# Patient Record
Sex: Female | Born: 1955 | Race: White | Hispanic: No | Marital: Married | Smoking: Never smoker
Health system: Southern US, Community
[De-identification: ages and names within clinical notes are randomized; demographics above are authoritative.]

## PROBLEM LIST (undated history)

## (undated) DIAGNOSIS — N393 Stress incontinence (female) (male): Secondary | ICD-10-CM

## (undated) DIAGNOSIS — R42 Dizziness and giddiness: Secondary | ICD-10-CM

## (undated) DIAGNOSIS — R112 Nausea with vomiting, unspecified: Secondary | ICD-10-CM

## (undated) DIAGNOSIS — G4730 Sleep apnea, unspecified: Secondary | ICD-10-CM

## (undated) DIAGNOSIS — E0590 Thyrotoxicosis, unspecified without thyrotoxic crisis or storm: Secondary | ICD-10-CM

## (undated) DIAGNOSIS — B001 Herpesviral vesicular dermatitis: Secondary | ICD-10-CM

## (undated) DIAGNOSIS — T7840XA Allergy, unspecified, initial encounter: Secondary | ICD-10-CM

## (undated) DIAGNOSIS — Z9889 Other specified postprocedural states: Secondary | ICD-10-CM

## (undated) DIAGNOSIS — E7800 Pure hypercholesterolemia, unspecified: Secondary | ICD-10-CM

## (undated) DIAGNOSIS — M4316 Spondylolisthesis, lumbar region: Secondary | ICD-10-CM

## (undated) DIAGNOSIS — G4733 Obstructive sleep apnea (adult) (pediatric): Secondary | ICD-10-CM

## (undated) DIAGNOSIS — I639 Cerebral infarction, unspecified: Secondary | ICD-10-CM

## (undated) DIAGNOSIS — K222 Esophageal obstruction: Secondary | ICD-10-CM

## (undated) DIAGNOSIS — F32A Depression, unspecified: Secondary | ICD-10-CM

## (undated) DIAGNOSIS — D649 Anemia, unspecified: Secondary | ICD-10-CM

## (undated) DIAGNOSIS — M1712 Unilateral primary osteoarthritis, left knee: Secondary | ICD-10-CM

## (undated) DIAGNOSIS — S82899A Other fracture of unspecified lower leg, initial encounter for closed fracture: Secondary | ICD-10-CM

## (undated) DIAGNOSIS — I10 Essential (primary) hypertension: Secondary | ICD-10-CM

## (undated) DIAGNOSIS — J189 Pneumonia, unspecified organism: Secondary | ICD-10-CM

## (undated) DIAGNOSIS — E119 Type 2 diabetes mellitus without complications: Secondary | ICD-10-CM

## (undated) DIAGNOSIS — G2581 Restless legs syndrome: Secondary | ICD-10-CM

## (undated) DIAGNOSIS — M797 Fibromyalgia: Secondary | ICD-10-CM

## (undated) DIAGNOSIS — R51 Headache: Secondary | ICD-10-CM

## (undated) DIAGNOSIS — M722 Plantar fascial fibromatosis: Secondary | ICD-10-CM

## (undated) DIAGNOSIS — K219 Gastro-esophageal reflux disease without esophagitis: Secondary | ICD-10-CM

## (undated) DIAGNOSIS — M1990 Unspecified osteoarthritis, unspecified site: Secondary | ICD-10-CM

## (undated) DIAGNOSIS — K760 Fatty (change of) liver, not elsewhere classified: Secondary | ICD-10-CM

## (undated) DIAGNOSIS — H269 Unspecified cataract: Secondary | ICD-10-CM

## (undated) HISTORY — DX: Fatty (change of) liver, not elsewhere classified: K76.0

## (undated) HISTORY — PX: TRIGGER FINGER RELEASE: SHX641

## (undated) HISTORY — DX: Other specified postprocedural states: R11.2

## (undated) HISTORY — PX: OTHER SURGICAL HISTORY: SHX169

## (undated) HISTORY — DX: Unspecified osteoarthritis, unspecified site: M19.90

## (undated) HISTORY — DX: Essential (primary) hypertension: I10

## (undated) HISTORY — DX: Pure hypercholesterolemia, unspecified: E78.00

## (undated) HISTORY — DX: Allergy, unspecified, initial encounter: T78.40XA

## (undated) HISTORY — DX: Sleep apnea, unspecified: G47.30

## (undated) HISTORY — DX: Gastro-esophageal reflux disease without esophagitis: K21.9

## (undated) HISTORY — PX: WISDOM TOOTH EXTRACTION: SHX21

## (undated) HISTORY — PX: SHOULDER SURGERY: SHX246

## (undated) HISTORY — DX: Restless legs syndrome: G25.81

## (undated) HISTORY — PX: COLONOSCOPY W/ BIOPSIES AND POLYPECTOMY: SHX1376

## (undated) HISTORY — DX: Esophageal obstruction: K22.2

## (undated) HISTORY — PX: EYE SURGERY: SHX253

## (undated) HISTORY — DX: Other specified postprocedural states: Z98.890

## (undated) HISTORY — DX: Type 2 diabetes mellitus without complications: E11.9

## (undated) HISTORY — DX: Plantar fascial fibromatosis: M72.2

## (undated) HISTORY — DX: Headache: R51

## (undated) HISTORY — PX: CERVICAL DISC SURGERY: SHX588

## (undated) HISTORY — DX: Obstructive sleep apnea (adult) (pediatric): G47.33

## (undated) HISTORY — DX: Unspecified cataract: H26.9

## (undated) HISTORY — DX: Fibromyalgia: M79.7

## (undated) HISTORY — DX: Other fracture of unspecified lower leg, initial encounter for closed fracture: S82.899A

---

## 1994-06-03 ENCOUNTER — Encounter: Payer: Self-pay | Admitting: Internal Medicine

## 1999-04-28 ENCOUNTER — Encounter (INDEPENDENT_AMBULATORY_CARE_PROVIDER_SITE_OTHER): Payer: Self-pay | Admitting: Specialist

## 1999-04-28 ENCOUNTER — Encounter: Payer: Self-pay | Admitting: Internal Medicine

## 1999-04-28 ENCOUNTER — Other Ambulatory Visit: Admission: RE | Admit: 1999-04-28 | Discharge: 1999-04-28 | Payer: Self-pay | Admitting: Internal Medicine

## 1999-06-24 ENCOUNTER — Other Ambulatory Visit: Admission: RE | Admit: 1999-06-24 | Discharge: 1999-06-24 | Payer: Self-pay | Admitting: Gynecology

## 2000-09-06 ENCOUNTER — Other Ambulatory Visit: Admission: RE | Admit: 2000-09-06 | Discharge: 2000-09-06 | Payer: Self-pay | Admitting: Gynecology

## 2001-11-28 ENCOUNTER — Other Ambulatory Visit: Admission: RE | Admit: 2001-11-28 | Discharge: 2001-11-28 | Payer: Self-pay | Admitting: Gynecology

## 2002-12-18 ENCOUNTER — Other Ambulatory Visit: Admission: RE | Admit: 2002-12-18 | Discharge: 2002-12-18 | Payer: Self-pay | Admitting: Gynecology

## 2003-02-24 DIAGNOSIS — M797 Fibromyalgia: Secondary | ICD-10-CM

## 2003-02-24 HISTORY — DX: Fibromyalgia: M79.7

## 2003-12-25 ENCOUNTER — Other Ambulatory Visit: Admission: RE | Admit: 2003-12-25 | Discharge: 2003-12-25 | Payer: Self-pay | Admitting: Gynecology

## 2005-02-03 ENCOUNTER — Other Ambulatory Visit: Admission: RE | Admit: 2005-02-03 | Discharge: 2005-02-03 | Payer: Self-pay | Admitting: Gynecology

## 2007-08-16 ENCOUNTER — Ambulatory Visit: Payer: Self-pay

## 2007-11-10 ENCOUNTER — Encounter: Payer: Self-pay | Admitting: Neurosurgery

## 2007-11-18 ENCOUNTER — Ambulatory Visit (HOSPITAL_COMMUNITY): Admission: RE | Admit: 2007-11-18 | Discharge: 2007-11-18 | Payer: Self-pay | Admitting: Neurosurgery

## 2007-11-21 ENCOUNTER — Ambulatory Visit: Payer: Self-pay | Admitting: Family Medicine

## 2007-12-15 ENCOUNTER — Ambulatory Visit: Payer: Self-pay | Admitting: Internal Medicine

## 2007-12-15 DIAGNOSIS — K219 Gastro-esophageal reflux disease without esophagitis: Secondary | ICD-10-CM | POA: Insufficient documentation

## 2007-12-15 DIAGNOSIS — R740 Nonspecific elevation of levels of transaminase and lactic acid dehydrogenase [LDH]: Secondary | ICD-10-CM

## 2007-12-15 DIAGNOSIS — R7402 Elevation of levels of lactic acid dehydrogenase (LDH): Secondary | ICD-10-CM | POA: Insufficient documentation

## 2007-12-22 ENCOUNTER — Ambulatory Visit: Payer: Self-pay | Admitting: Internal Medicine

## 2007-12-22 DIAGNOSIS — K7689 Other specified diseases of liver: Secondary | ICD-10-CM | POA: Insufficient documentation

## 2007-12-22 LAB — CONVERTED CEMR LAB
A-1 Antitrypsin, Ser: 152 mg/dL (ref 83–200)
AST: 127 units/L — ABNORMAL HIGH (ref 0–37)
Alkaline Phosphatase: 85 units/L (ref 39–117)
Bilirubin, Direct: 0.1 mg/dL (ref 0.0–0.3)
CO2: 29 meq/L (ref 19–32)
Ceruloplasmin: 42 mg/dL (ref 21–63)
Chloride: 101 meq/L (ref 96–112)
GFR calc Af Amer: 114 mL/min
Glucose, Bld: 89 mg/dL (ref 70–99)
INR: 1 (ref 0.8–1.0)
Lymphocytes Relative: 25.6 % (ref 12.0–46.0)
Monocytes Relative: 12.5 % — ABNORMAL HIGH (ref 3.0–12.0)
Neutrophils Relative %: 59.2 % (ref 43.0–77.0)
Platelets: 262 10*3/uL (ref 150–400)
Potassium: 3.4 meq/L — ABNORMAL LOW (ref 3.5–5.1)
Prothrombin Time: 12.1 s (ref 10.9–13.3)
RDW: 12.5 % (ref 11.5–14.6)
Saturation Ratios: 23.3 % (ref 20.0–50.0)
Sodium: 138 meq/L (ref 135–145)
Total Protein: 7.4 g/dL (ref 6.0–8.3)
WBC: 5.6 10*3/uL (ref 4.5–10.5)

## 2008-01-18 ENCOUNTER — Encounter: Payer: Self-pay | Admitting: Orthopedic Surgery

## 2008-01-23 ENCOUNTER — Encounter: Payer: Self-pay | Admitting: Internal Medicine

## 2008-01-23 ENCOUNTER — Ambulatory Visit: Payer: Self-pay | Admitting: Internal Medicine

## 2008-01-24 ENCOUNTER — Encounter: Payer: Self-pay | Admitting: Internal Medicine

## 2008-03-16 ENCOUNTER — Telehealth: Payer: Self-pay | Admitting: Internal Medicine

## 2008-04-04 ENCOUNTER — Ambulatory Visit (HOSPITAL_COMMUNITY): Admission: RE | Admit: 2008-04-04 | Discharge: 2008-04-04 | Payer: Self-pay | Admitting: Neurosurgery

## 2008-04-10 ENCOUNTER — Inpatient Hospital Stay (HOSPITAL_COMMUNITY): Admission: RE | Admit: 2008-04-10 | Discharge: 2008-04-12 | Payer: Self-pay | Admitting: Neurosurgery

## 2008-08-02 ENCOUNTER — Ambulatory Visit: Payer: Self-pay | Admitting: Psychiatry

## 2010-06-10 LAB — BASIC METABOLIC PANEL
CO2: 27 mEq/L (ref 19–32)
Calcium: 9.6 mg/dL (ref 8.4–10.5)
Chloride: 103 mEq/L (ref 96–112)
GFR calc Af Amer: >60 mL/min (ref >60)
Sodium: 139 mEq/L (ref 135–145)

## 2010-06-10 LAB — DIFFERENTIAL
Eosinophils Absolute: 0.2 10*3/uL (ref 0.0–0.7)
Eosinophils Relative: 2 % (ref 0–5)
Lymphs Abs: 2.3 10*3/uL (ref 0.7–4.0)
Monocytes Relative: 10 % (ref 3–12)

## 2010-06-10 LAB — COMPREHENSIVE METABOLIC PANEL
ALT: 192 U/L — ABNORMAL HIGH (ref 0–35)
AST: 127 U/L — ABNORMAL HIGH (ref 0–37)
CO2: 30 mEq/L (ref 19–32)
Calcium: 9.7 mg/dL (ref 8.4–10.5)
GFR calc Af Amer: >60 mL/min (ref >60)
Sodium: 138 mEq/L (ref 135–145)
Total Protein: 7.6 g/dL (ref 6.0–8.3)

## 2010-06-10 LAB — URINALYSIS, ROUTINE W REFLEX MICROSCOPIC
Glucose, UA: NEGATIVE mg/dL
Hgb urine dipstick: NEGATIVE
Specific Gravity, Urine: 1.016 (ref 1.005–1.030)

## 2010-06-10 LAB — CBC
Hemoglobin: 15.4 g/dL — ABNORMAL HIGH (ref 12.0–15.0)
MCHC: 35.7 g/dL (ref 30.0–36.0)
MCHC: 35.2 g/dL (ref 30.0–36.0)
MCV: 87.3 fL (ref 78.0–100.0)
RBC: 5.19 MIL/uL — ABNORMAL HIGH (ref 3.87–5.11)
RBC: 5.03 MIL/uL (ref 3.87–5.11)
RDW: 13 % (ref 11.5–15.5)

## 2010-07-08 NOTE — Op Note (Signed)
NAMEMarland Kitchen  Carolyn, Graham                 ACCOUNT NO.:  [id number]   MEDICAL RECORD NO.:  [id number]          PATIENT TYPE:  INP   LOCATION:  3032                         FACILITY:  [facility]   PHYSICIAN:  Payton Doughty, M.D.      DATE OF BIRTH:  [birth year]   DATE OF PROCEDURE:  04/10/2008  DATE OF DISCHARGE:                               OPERATIVE REPORT   PREOPERATIVE DIAGNOSIS:  Herniated disk C4-5, C5-6.   POSTOPERATIVE DIAGNOSIS:  Herniated disk C4-5, C5-6.   PROCEDURE:  C4-5, C5-6 anterior cervical decompression and fusion with  reflex hybrid plate.   ANESTHESIA:  General endotracheal.   PREPARATION:  Sterile Betadine prep and scrubbed with alcohol wipe.   COMPLICATIONS:  None.   NURSE ASSISTANT:  Bedelia Person, MD   DOCTOR ASSISTANT:  Danae Orleans. Venetia Maxon, MD   BODY OF TEXT:  This is a 55 year old girl with a disk at C4-5 and C5-6  taken to operating room, smoothly anesthetized and intubated, placed  supine on the operating table in the halter head traction with the neck  slightly extended.  Following shave, prep and drape in usual sterile  fashion, skin was incised in midline to the medial border of  sternocleidomastoid muscle.  The platysma was identified, elevated,  divided, and undermined.  Sternocleidomastoid was identified and medial  dissection revealed the carotid artery retracted laterally to the left.  Trachea and esophagus retracted laterally to the right exposing the  bones in the anterior cervical spine.  Marker was placed.  Intraoperative x-ray obtained to confirm correctness of level.  Having  confirmed correctness of level, longus colli was taken down bilaterally  and the shadow line self-retaining retractors were placed transversely  in a cephalocaudal direction.  Diskectomy was carried out grossly at C4-  5 and C5-6, and then an operating microscope was then brought in.  We  used microdissection technique to remove the remaining disks, removed  the osteophytes and opened  the neuroforamen and explored the anterior  epidural space bilaterally.  On the right side, at 4-5 there was a disk  extending out at the neuroforamen.  At 5-6 the disk was central and  slightly to the right.  Upon complete diskectomy, the 7-mm bone graft  fashioned from patellar allograft and tapped into place.  A 30-mm Reflex  hybrid plate was placed with 12-mm screws, 2 in C4, 2 in C5, and 2 in  C6.  Intraoperative x-ray showed good placement of bone graft, plate and  screws.  Wound was irrigated.  Hemostasis assured.  The esophagus  inspected and found to be  free of lesion.  Successive layers of 3-0 Vicryl and 4-0 Vicryl were  used to close.  Benzoin and Steri-Strips were placed, made occlusive  with Telfa and OpSite.  The patient was placed in Aspen collar and  returned to recovery room in good condition.           ______________________________  Payton Doughty, M.D.     MWR/MEDQ  D:  04/10/2008  T:  04/11/2008  Job:  [id number]

## 2010-07-08 NOTE — H&P (Signed)
NAMEMarland Graham  [name], [name]                 ACCOUNT NO.:  [id number]   MEDICAL RECORD NO.:  [id number]          PATIENT TYPE:  INP   LOCATION:  3032                         FACILITY:  [facility]   PHYSICIAN:  Payton Doughty, M.D.      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   A very nice 55 year old right-handed white girl who has pain in neck and  on right arm.  She has also had a lot of pain in her low back and into  her legs.  This has been going on for a number of years.  Neck pain is  more recent as was as the disease in her right arm.  I saw with her MR  in my office, planned an operation.  She got set up for operation in the  last week.  Today she fell and hit her head and sustained a loss of  consciousness and felt we would be better of wait a week to make sure  that she had recovered from her concussion before general anesthesia.  She is here now for operation.   MEDICAL HISTORY:  Remarkable for headache and hypertension.   Takes Neurontin, Ambien, Mirapex, Zanaflex, Effexor, Singulair,  hydrochlorothiazide, Toprol, and Protonix.   She is sensitive to CODEINE so far as it makes her nauseous.   SURGICAL HISTORY:  Rotator cuff in 2004.   SOCIAL HISTORY:  She does not smoke or drink and is an Airline pilot for  [employer].   FAMILY HISTORY:  Mother died at 64 of colon cancer.  Father died at 32  of lung cancer.   REVIEW OF SYSTEMS:  Marked for night sweats, glasses, tinnitus, ear  pain, hypertension, hypercholesterolemia, broken pinky, back pain, neck  pain, thyroid disease, inability to concentrate, and difficulty with  memory.   PHYSICAL EXAMINATION:  HEENT:  Within normal limits.  NECK:  She has reasonable range of motion of neck.  Flexion reproducing  her neck pain.  CHEST:  Clear.  CARDIAC:  Regular rate and rhythm.  ABDOMEN:  Nontender.  No hepatosplenomegaly.  EXTREMITIES:  Without clubbing or cyanosis.  Peripheral pulses are good.  GU:  Deferred.  NEUROLOGIC:  She is awake, alert, and oriented.  Cranial nerves are  intact.  Motor exam shows 5/5 strength throughout the upper and lower  extremities except for the right biceps which is about 5-/5.  Sensory  dysesthesias described in right C6 and C7 distribution.  Reflexes are 1  throughout the upper extremities.  She has a positive Hoffmann's on the  right, but not on left.  Lower extremities are not myelopathic except  for slightly facilitated reflexes in her right knee.   MR demonstrates spondylitic change at L4-5 and L5-6, worse off to the  right with compression on right side of the cord.  There is no abnormal  signal.   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   Plans for anterior decompression and fusion at C4-5 and C5-6.  The risks  and benefits have been discussed with her.  She wished to proceed.            ______________________________  Payton Doughty, M.D.     MWR/MEDQ  D:  04/10/2008  T:  04/11/2008  Job:  [id number]

## 2010-10-14 ENCOUNTER — Other Ambulatory Visit: Payer: Self-pay | Admitting: Oncology

## 2010-10-14 ENCOUNTER — Ambulatory Visit (HOSPITAL_COMMUNITY)
Admission: RE | Admit: 2010-10-14 | Discharge: 2010-10-14 | Disposition: A | Discharge status: Home / Self Care | Payer: Managed Care, Other (non HMO) | Source: Ambulatory Visit | Attending: Oncology | Admitting: Oncology

## 2010-10-14 ENCOUNTER — Encounter: Payer: Self-pay | Admitting: Oncology

## 2010-10-14 ENCOUNTER — Encounter (HOSPITAL_BASED_OUTPATIENT_CLINIC_OR_DEPARTMENT_OTHER): Payer: Managed Care, Other (non HMO) | Admitting: Oncology

## 2010-10-14 DIAGNOSIS — M545 Low back pain, unspecified: Secondary | ICD-10-CM | POA: Insufficient documentation

## 2010-10-14 DIAGNOSIS — M5137 Other intervertebral disc degeneration, lumbosacral region: Secondary | ICD-10-CM | POA: Insufficient documentation

## 2010-10-14 DIAGNOSIS — D472 Monoclonal gammopathy: Secondary | ICD-10-CM

## 2010-10-14 DIAGNOSIS — M51379 Other intervertebral disc degeneration, lumbosacral region without mention of lumbar back pain or lower extremity pain: Secondary | ICD-10-CM | POA: Insufficient documentation

## 2010-10-14 DIAGNOSIS — C9000 Multiple myeloma not having achieved remission: Secondary | ICD-10-CM

## 2010-10-14 LAB — CBC WITH DIFFERENTIAL/PLATELET
Basophils Absolute: 0.1 10*3/uL (ref 0.0–0.1)
Eosinophils Absolute: 0 10*3/uL (ref 0.0–0.5)
HCT: 42.5 % (ref 34.8–46.6)
LYMPH%: 25.9 % (ref 14.0–49.7)
MCV: 83.8 fL (ref 79.5–101.0)
MONO%: 9.6 % (ref 0.0–14.0)
NEUT#: 4.5 10*3/uL (ref 1.5–6.5)
NEUT%: 63.5 % (ref 38.4–76.8)
Platelets: 243 10*3/uL (ref 145–400)
RBC: 5.07 10*6/uL (ref 3.70–5.45)

## 2010-10-16 LAB — SPEP & IFE WITH QIG
Albumin ELP: 56 % (ref 55.8–66.1)
Beta 2: 5.1 % (ref 3.2–6.5)
IgA: 162 mg/dL (ref 68–380)
IgM, Serum: 121 mg/dL (ref 52–322)
Total Protein, Serum Electrophoresis: 7.6 g/dL (ref 6.0–8.3)

## 2010-10-16 LAB — COMPREHENSIVE METABOLIC PANEL
ALT: 82 U/L — ABNORMAL HIGH (ref 0–35)
Alkaline Phosphatase: 99 U/L (ref 39–117)
Sodium: 138 mEq/L (ref 135–145)
Total Bilirubin: 0.3 mg/dL (ref 0.3–1.2)
Total Protein: 7.6 g/dL (ref 6.0–8.3)

## 2010-10-28 ENCOUNTER — Encounter (HOSPITAL_BASED_OUTPATIENT_CLINIC_OR_DEPARTMENT_OTHER): Payer: Managed Care, Other (non HMO) | Admitting: Oncology

## 2010-10-28 DIAGNOSIS — D472 Monoclonal gammopathy: Secondary | ICD-10-CM

## 2010-11-24 LAB — BASIC METABOLIC PANEL
Calcium: 9.2
GFR calc Af Amer: >60
GFR calc non Af Amer: >60
Glucose, Bld: 119 — ABNORMAL HIGH
Potassium: 3.9
Sodium: 137

## 2010-11-24 LAB — URINALYSIS, ROUTINE W REFLEX MICROSCOPIC
Bilirubin Urine: NEGATIVE
Hgb urine dipstick: NEGATIVE
Ketones, ur: NEGATIVE
Nitrite: NEGATIVE
pH: 7.5

## 2010-11-24 LAB — URINE MICROSCOPIC-ADD ON

## 2010-11-24 LAB — COMPREHENSIVE METABOLIC PANEL
ALT: 207 — ABNORMAL HIGH
AST: 178 — ABNORMAL HIGH
Alkaline Phosphatase: 86
Glucose, Bld: 95
Potassium: 3.6
Sodium: 137
Total Protein: 6.9

## 2010-11-24 LAB — DIFFERENTIAL
Basophils Relative: 1
Eosinophils Absolute: 0.1
Eosinophils Relative: 1
Monocytes Absolute: 0.6
Monocytes Relative: 9
Neutrophils Relative %: 63

## 2010-11-24 LAB — HEPATIC FUNCTION PANEL
ALT: 199 — ABNORMAL HIGH
AST: 150 — ABNORMAL HIGH
Albumin: 3.7
Total Protein: 6.8

## 2010-11-24 LAB — APTT: aPTT: 32

## 2010-11-24 LAB — PROTIME-INR: INR: 1

## 2010-11-24 LAB — CBC
Hemoglobin: 15
RBC: 4.92
RDW: 13.1

## 2010-12-25 ENCOUNTER — Institutional Professional Consult (permissible substitution): Payer: Managed Care, Other (non HMO) | Admitting: Pulmonary Disease

## 2011-01-05 ENCOUNTER — Encounter: Payer: Self-pay | Admitting: Pulmonary Disease

## 2011-01-06 ENCOUNTER — Ambulatory Visit (INDEPENDENT_AMBULATORY_CARE_PROVIDER_SITE_OTHER): Payer: Managed Care, Other (non HMO) | Admitting: Pulmonary Disease

## 2011-01-06 ENCOUNTER — Encounter: Payer: Self-pay | Admitting: Pulmonary Disease

## 2011-01-06 VITALS — BP 122/76 | HR 93 | Temp 98.1°F | Ht 61.0 in | Wt 189.6 lb

## 2011-01-06 DIAGNOSIS — G4733 Obstructive sleep apnea (adult) (pediatric): Secondary | ICD-10-CM

## 2011-01-06 DIAGNOSIS — M797 Fibromyalgia: Secondary | ICD-10-CM | POA: Insufficient documentation

## 2011-01-06 DIAGNOSIS — G4730 Sleep apnea, unspecified: Secondary | ICD-10-CM | POA: Insufficient documentation

## 2011-01-06 DIAGNOSIS — IMO0001 Reserved for inherently not codable concepts without codable children: Secondary | ICD-10-CM

## 2011-01-06 DIAGNOSIS — G4700 Insomnia, unspecified: Secondary | ICD-10-CM

## 2011-01-06 DIAGNOSIS — G2581 Restless legs syndrome: Secondary | ICD-10-CM

## 2011-01-06 MED ORDER — ZOLPIDEM TARTRATE 10 MG PO TABS: 10.0000 mg | ORAL_TABLET | Freq: Every evening | ORAL | Status: DC | PRN

## 2011-01-06 MED ORDER — PRAMIPEXOLE DIHYDROCHLORIDE 0.5 MG PO TABS: 0.5000 mg | ORAL_TABLET | Freq: Three times a day (TID) | ORAL | Status: DC

## 2011-01-06 MED ORDER — PRAMIPEXOLE DIHYDROCHLORIDE 0.5 MG PO TABS: 0.5000 mg | ORAL_TABLET | Freq: Every day | ORAL | Status: DC

## 2011-01-06 NOTE — Assessment & Plan Note (Signed)
She has been stable on mirapex.  She was advised that her refills would not be done through her new neurologist anymore.  Will refill her script for mirapex now, but advised that she would need to have these filled in the future by her primary care physician.

## 2011-01-06 NOTE — Assessment & Plan Note (Signed)
She was diagnosed with sleep apnea in 2010.  She has been using CPAP since.  She has an increase in her weight since original set up.  She has more trouble with her sleep after weight change in spite of reported compliance with CPAP.  Will arrange for auto-CPAP titration to determine optimal pressure settings.  If she is not improved after this, she will then need an in-lab titration study.

## 2011-01-06 NOTE — Assessment & Plan Note (Signed)
She is followed by rheumatology.

## 2011-01-06 NOTE — Patient Instructions (Signed)
Will arrange for check of CPAP settings at home>>will call with results Follow up in 3 months

## 2011-01-06 NOTE — Assessment & Plan Note (Signed)
She has been chronically dependent on [place].  She was advised that her refills would not be done through her new neurologist anymore.  Will refill her script for now.  Will re-assess whether she needs to remain on sleep aides at next visit.

## 2011-01-06 NOTE — Progress Notes (Deleted)
  Subjective:    Patient ID: Carolyn Graham, female    DOB: 12/04/1955, 55 y.o.   MRN: [id number]  HPI    Review of Systems  Constitutional: Positive for unexpected weight change. Negative for fever.  HENT: Positive for ear pain and congestion. Negative for nosebleeds, sore throat, rhinorrhea, sneezing, trouble swallowing, postnasal drip and sinus pressure.   Eyes: Negative for redness and itching.  Respiratory: Negative for cough, chest tightness, shortness of breath and wheezing.   Cardiovascular: Positive for leg swelling. Negative for palpitations.  Gastrointestinal: Negative for nausea and vomiting.  Genitourinary: Negative for dysuria.  Musculoskeletal: Positive for joint swelling.  Skin: Negative for rash.  Neurological: Positive for headaches.  Hematological: Bruises/bleeds easily.  Psychiatric/Behavioral: Negative for dysphoric mood. The patient is not nervous/anxious.        Objective:   Physical Exam        Assessment & Plan:

## 2011-01-06 NOTE — Progress Notes (Signed)
Chief Complaint  Patient presents with  . sleep consult    patient already on cpap  CC  hard to get to sleep ,wakes up tired,restless legs chronic pain  fibromyalgia, migraines     History of Present Illness: CC: Carolyn Graham  Carolyn Graham is a 55 y.o. female for evaluation of sleep apnea.  She was previously followed at the [facility] clinic for her headaches.  She was found to have mild sleep apnea in 2010, and started on CPAP.  She has been using CPAP since.  She has a hybrid mask.  She was also being treated for insomnia and restless legs.  Her physician at [facility] apparently left the practice, and she was advised that she would need a new sleep doctor to address these issues.  She goes to bed at 10 pm.  She takes [place] about 30 minutes before this.  She has been using [place] for years.  She will take about 1 hour to fall asleep.  Her husband usually watches TV in bed.  She wakes up occasionally to use the bathroom.  She gets out of bed at 6 am, but sleeps in on the weekend.  She feels tired in the morning, and this is not improved if she sleeps longer.  She will drink sodas and coffee during the day to keep her going.  She has been using mirapex for restless legs for years.  She does well when she uses her medicines.  The patient denies sleep walking, sleep talking, bruxism, or nightmares.  The patient denies sleep hallucinations, sleep paralysis, or cataplexy.  She has gained about 30 lbs over the past 2 years.  She does not smoke or drink alcohol.  Her Epworth score is 13 out of 24.  Past Medical History  Diagnosis Date  . Hypertension   . Headache   . Hypercholesteremia   . Thyroiditis   . OSA (obstructive sleep apnea)   . GERD (gastroesophageal reflux disease)   . Restless leg syndrome   . Fibromyalgia   . Fatty liver   . Schatzki's ring     Past Surgical History  Procedure Date  . Cervical disc surgery     cervical decompression c4-5 and c5-6  . Shoulder  surgery   . Trigger finger release     No current outpatient prescriptions on file prior to visit.    Allergies  Allergen Reactions  . Codeine   . Darvocet (Propoxyphene N-Acetaminophen)     family history includes Colon cancer (age of onset:70) in her mother and Lung cancer (age of onset:55) in her father.   reports that she has never smoked. She has never used smokeless tobacco.  Blood pressure 122/76, pulse 93, temperature 98.1 F (36.7 C), temperature source Oral, height 5\' 1"  (1.549 m), weight 189 lb 9.6 oz (86.002 kg), SpO2 96.00%. Body mass index is 35.82 kg/(m^2).  Physical Exam:  General - Obese HEENT - PERRLA, EOMI, no sinus tenderness, no oral exudate, no LAN, no thyromegaly Cardiac - s1s2 regular, no murmur Chest - CTA Abdomen - soft, non-tender, normal bowel sounds Extremities - no e/c/c Neurologic - normal strength, CN intact Skin - no rashes Psychiatric - normal mood, behavior  Assessment/Plan:  OSA (obstructive sleep apnea) She was diagnosed with sleep apnea in 2010.  She has been using CPAP since.  She has an increase in her weight since original set up.  She has more trouble with her sleep after weight change in spite of reported  compliance with CPAP.  Will arrange for auto-CPAP titration to determine optimal pressure settings.  If she is not improved after this, she will then need an in-lab titration study.  Restless legs syndrome She has been stable on mirapex.  She was advised that her refills would not be done through her new neurologist anymore.  Will refill her script for mirapex now, but advised that she would need to have these filled in the future by her primary care physician.  Insomnia She has been chronically dependent on ambien.  She was advised that her refills would not be done through her new neurologist anymore.  Will refill her script for now.  Will re-assess whether she needs to remain on sleep aides at next visit.  Fibromyalgia She  is followed by rheumatology.     Outpatient Encounter Prescriptions as of 01/06/2011  Medication Sig Dispense Refill  . MAGNESIUM ASPARTATE PO Take by mouth.        . SUMAtriptan (IMITREX) 100 MG tablet Take 100 mg by mouth every 2 (two) hours as needed.        . valACYclovir (VALTREX) 1000 MG tablet Take 1,000 mg by mouth as needed.        . CYMBALTA 30 MG capsule       . hydrochlorothiazide (HYDRODIURIL) 25 MG tablet       . omeprazole (PRILOSEC) 20 MG capsule       . pramipexole (MIRAPEX) 0.5 MG tablet Take 1 tablet (0.5 mg total) by mouth at bedtime.  90 tablet  1  . traMADol (ULTRAM) 50 MG tablet       . venlafaxine (EFFEXOR-XR) 37.5 MG 24 hr capsule       . Vitamin D, Ergocalciferol, (DRISDOL) 50000 UNITS CAPS       . zolpidem (AMBIEN) 10 MG tablet Take 1 tablet (10 mg total) by mouth at bedtime as needed for sleep.  30 tablet  5  . zolpidem (AMBIEN) 10 MG tablet Take 1 tablet (10 mg total) by mouth at bedtime as needed for sleep.  90 tablet  1  . DISCONTD: pramipexole (MIRAPEX) 0.5 MG tablet Take 1 tablet (0.5 mg total) by mouth 3 (three) times daily.  90 tablet  1  . DISCONTD: zolpidem (AMBIEN) 10 MG tablet         Carolyn Graham Pager:  [phone] 01/06/2011, 5:03 PM

## 2011-02-03 ENCOUNTER — Ambulatory Visit: Payer: Self-pay | Admitting: Unknown Physician Specialty

## 2011-03-18 ENCOUNTER — Telehealth: Payer: Self-pay | Admitting: Oncology

## 2011-03-18 NOTE — Telephone Encounter (Signed)
Talked to pt, gave her appt for March 2013, lab and MD °

## 2011-04-28 ENCOUNTER — Other Ambulatory Visit (HOSPITAL_BASED_OUTPATIENT_CLINIC_OR_DEPARTMENT_OTHER): Payer: Managed Care, Other (non HMO) | Admitting: Lab

## 2011-04-28 DIAGNOSIS — D472 Monoclonal gammopathy: Secondary | ICD-10-CM

## 2011-04-28 LAB — CBC WITH DIFFERENTIAL/PLATELET
BASO%: 0.4 % (ref 0.0–2.0)
Basophils Absolute: 0 10*3/uL (ref 0.0–0.1)
HCT: 43.7 % (ref 34.8–46.6)
HGB: 14.8 g/dL (ref 11.6–15.9)
MCHC: 33.9 g/dL (ref 31.5–36.0)
MONO#: 0.8 10*3/uL (ref 0.1–0.9)
NEUT%: 66.3 % (ref 38.4–76.8)
WBC: 8.6 10*3/uL (ref 3.9–10.3)
lymph#: 2 10*3/uL (ref 0.9–3.3)

## 2011-04-30 LAB — SPEP & IFE WITH QIG
Albumin ELP: 56.8 % (ref 55.8–66.1)
Alpha-1-Globulin: 4.2 % (ref 2.9–4.9)
Alpha-2-Globulin: 11.3 % (ref 7.1–11.8)
Beta Globulin: 6.5 % (ref 4.7–7.2)
IgG (Immunoglobin G), Serum: 1560 mg/dL (ref 690–1700)
Total Protein, Serum Electrophoresis: 7.5 g/dL (ref 6.0–8.3)

## 2011-04-30 LAB — COMPREHENSIVE METABOLIC PANEL
ALT: 81 U/L — ABNORMAL HIGH (ref 0–35)
Albumin: 4.5 g/dL (ref 3.5–5.2)
CO2: 22 mEq/L (ref 19–32)
Calcium: 9.6 mg/dL (ref 8.4–10.5)
Chloride: 103 mEq/L (ref 96–112)
Creatinine, Ser: 0.76 mg/dL (ref 0.50–1.10)
Potassium: 3.9 mEq/L (ref 3.5–5.3)
Total Protein: 7.5 g/dL (ref 6.0–8.3)

## 2011-04-30 LAB — KAPPA/LAMBDA LIGHT CHAINS: Kappa free light chain: 1.12 mg/dL (ref 0.33–1.94)

## 2011-05-05 ENCOUNTER — Ambulatory Visit (HOSPITAL_BASED_OUTPATIENT_CLINIC_OR_DEPARTMENT_OTHER): Payer: Managed Care, Other (non HMO) | Admitting: Oncology

## 2011-05-05 VITALS — BP 140/91 | HR 86 | Temp 98.0°F | Ht 61.0 in | Wt 191.1 lb

## 2011-05-05 DIAGNOSIS — D7289 Other specified disorders of white blood cells: Secondary | ICD-10-CM

## 2011-05-05 DIAGNOSIS — D729 Disorder of white blood cells, unspecified: Secondary | ICD-10-CM

## 2011-05-05 NOTE — Progress Notes (Signed)
Hematology and Oncology Follow Up Visit  Carolyn Graham [id number] 1955/12/29 56 y.o. 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  She has an IgG kappa.   Interim History:  57 year old female whom I saw for the first time back on October 14, 2010.  At that time, she was noted to have an M spike, and she was sent to me for evaluation to rule out plasma cell disorder.  My workup at this time included a repeat serum protein electrophoresis which showed an M spike of 0.36 g/dL.  Her IgG level is normal at 1390, so are her IgA and IgM.  Kappa/lambda ratios are all normal, and so are her levels.  A skeletal survey was obtained and reviewed today with Carolyn Graham and showed no evidence of any lytic or sclerotic bony lesions.  Overall, Carolyn Graham is relatively asymptomatic.  She does report some occasional diffuse pain in the muscles related to possibly fibromyalgia, but really no evidence of any pathological fractures, no recurrent sinopulmonary infection. No new complaints since last visit.   Medications: I have reviewed the patient's current medications. Current outpatient prescriptions:CYMBALTA 30 MG capsule, , Disp: , Rfl: ;  hydrochlorothiazide (HYDRODIURIL) 25 MG tablet, , Disp: , Rfl: ;  MAGNESIUM ASPARTATE PO, Take by mouth.  , Disp: , Rfl: ;  omeprazole (PRILOSEC) 20 MG capsule, , Disp: , Rfl: ;  pramipexole (MIRAPEX) 0.5 MG tablet, Take 1 tablet (0.5 mg total) by mouth at bedtime., Disp: 90 tablet, Rfl: 1 SUMAtriptan (IMITREX) 100 MG tablet, Take 100 mg by mouth every 2 (two) hours as needed.  , Disp: , Rfl: ;  traMADol (ULTRAM) 50 MG tablet, , Disp: , Rfl: ;  valACYclovir (VALTREX) 1000 MG tablet, Take 1,000 mg by mouth as needed.  , Disp: , Rfl: ;  venlafaxine (EFFEXOR-XR) 37.5 MG 24 hr capsule, , Disp: , Rfl: ;  Vitamin D, Ergocalciferol,  (DRISDOL) 50000 UNITS CAPS, , Disp: , Rfl:  zolpidem (AMBIEN) 10 MG tablet, Take 1 tablet (10 mg total) by mouth at bedtime as needed for sleep., Disp: 30 tablet, Rfl: 5  Allergies:  Allergies  Allergen Reactions  . Codeine   . Darvocet (Propoxyphene N-Acetaminophen)     Past Medical History, Surgical history, Social history, and Family History were reviewed and updated.  Review of Systems: Constitutional:  Negative for fever, chills, night sweats, anorexia, weight loss, pain. Cardiovascular: no chest pain or dyspnea on exertion Respiratory: no cough, shortness of breath, or wheezing Neurological: no TIA or stroke symptoms Dermatological: negative ENT: negative Skin: Negative. Gastrointestinal: negative Genito-Urinary: no dysuria, trouble voiding, or hematuria Hematological and Lymphatic: negative Breast: negative Musculoskeletal: negative Remaining ROS negative. Physical Exam: Blood pressure 140/91, pulse 86, temperature 98 F (36.7 C), temperature source Oral, height 5\' 1"  (1.549 m), weight 191 lb 1.6 oz (86.682 kg). ECOG: 0 General appearance: alert Head: Normocephalic, without obvious abnormality, atraumatic Neck: no adenopathy, no carotid bruit, no JVD, supple, symmetrical, trachea midline and thyroid not enlarged, symmetric, no tenderness/mass/nodules Lymph nodes: Cervical, supraclavicular, and axillary nodes normal. Heart:regular rate and rhythm, S1, S2 normal, no murmur, click, rub or gallop Lung:chest clear, no wheezing, rales, normal symmetric air entry Abdomin: soft, non-tender, without masses or organomegaly EXT:no erythema, induration, or nodules   Lab Results: Lab Results  Component Value Date   WBC 8.6 04/28/2011   HGB 14.8 04/28/2011  HCT 43.7 04/28/2011   MCV 83.8 04/28/2011   PLT 277 04/28/2011     Chemistry      Component Value Date/Time   NA 139 04/28/2011 1332   K 3.9 04/28/2011 1332   CL 103 04/28/2011 1332   CO2 22 04/28/2011 1332   BUN 15 04/28/2011 1332     CREATININE 0.76 04/28/2011 1332      Component Value Date/Time   CALCIUM 9.6 04/28/2011 1332   ALKPHOS 114 04/28/2011 1332   AST 55* 04/28/2011 1332   ALT 81* 04/28/2011 1332   BILITOT 0.3 04/28/2011 1332       Impression and Plan:  This is a 56 year old female with the following issues: 1. 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  Again, I do not really see any evidence to suggest end-organ damage, no evidence to suggest active multiple myeloma.  Again, in all likelihood we are dealing with either a reactive process versus monoclonal gammopathy of undetermined significance.  At this point, I will continue active surveillance at this time, repeat it in 12 months, and then we will assess the need for further surveillance after that. 2. Elevated transaminases.  She has a history of fatty liver.  At this time no connection to her monoclonal protein.    Carolyn Hose, MD 3/12/20134:15 PM

## 2011-05-07 ENCOUNTER — Telehealth: Payer: Self-pay | Admitting: Oncology

## 2011-05-07 NOTE — Telephone Encounter (Signed)
Per comment on 04/29/2012 appt pt given schedule 05/05/2011.

## 2011-05-07 NOTE — Telephone Encounter (Signed)
Gave pt calendar today  for March 2014 lab and MD

## 2011-05-13 ENCOUNTER — Encounter: Payer: Self-pay | Admitting: Oncology

## 2011-05-15 ENCOUNTER — Ambulatory Visit: Payer: Self-pay | Admitting: Anesthesiology

## 2011-05-20 ENCOUNTER — Ambulatory Visit: Payer: Self-pay | Admitting: Unknown Physician Specialty

## 2012-04-29 ENCOUNTER — Other Ambulatory Visit: Payer: Managed Care, Other (non HMO) | Admitting: Lab

## 2012-05-02 ENCOUNTER — Telehealth: Payer: Self-pay | Admitting: Oncology

## 2012-05-02 NOTE — Telephone Encounter (Signed)
pt called to r/s ....done...no explanation

## 2012-05-04 ENCOUNTER — Ambulatory Visit: Payer: Managed Care, Other (non HMO) | Admitting: Oncology

## 2012-06-08 ENCOUNTER — Other Ambulatory Visit: Payer: Self-pay | Admitting: Oncology

## 2012-06-08 DIAGNOSIS — D729 Disorder of white blood cells, unspecified: Secondary | ICD-10-CM

## 2012-06-09 ENCOUNTER — Other Ambulatory Visit (HOSPITAL_BASED_OUTPATIENT_CLINIC_OR_DEPARTMENT_OTHER): Payer: BC Managed Care – PPO | Admitting: Lab

## 2012-06-09 DIAGNOSIS — D729 Disorder of white blood cells, unspecified: Secondary | ICD-10-CM

## 2012-06-09 DIAGNOSIS — D7289 Other specified disorders of white blood cells: Secondary | ICD-10-CM

## 2012-06-09 LAB — CBC WITH DIFFERENTIAL/PLATELET
Basophils Absolute: 0.1 10*3/uL (ref 0.0–0.1)
EOS%: 1 % (ref 0.0–7.0)
Eosinophils Absolute: 0.1 10*3/uL (ref 0.0–0.5)
HCT: 46 % (ref 34.8–46.6)
HGB: 15.5 g/dL (ref 11.6–15.9)
MONO#: 0.6 10*3/uL (ref 0.1–0.9)
NEUT#: 5 10*3/uL (ref 1.5–6.5)
NEUT%: 64.7 % (ref 38.4–76.8)
RDW: 14.6 % — ABNORMAL HIGH (ref 11.2–14.5)
WBC: 7.8 10*3/uL (ref 3.9–10.3)
lymph#: 2 10*3/uL (ref 0.9–3.3)

## 2012-06-09 LAB — COMPREHENSIVE METABOLIC PANEL (CC13)
AST: 15 U/L (ref 5–34)
Albumin: 3.7 g/dL (ref 3.5–5.0)
BUN: 16.2 mg/dL (ref 7.0–26.0)
CO2: 24 mEq/L (ref 22–29)
Calcium: 9.4 mg/dL (ref 8.4–10.4)
Chloride: 104 mEq/L (ref 98–107)
Creatinine: 0.8 mg/dL (ref 0.6–1.1)
Glucose: 107 mg/dl — ABNORMAL HIGH (ref 70–99)
Potassium: 3.4 mEq/L — ABNORMAL LOW (ref 3.5–5.1)

## 2012-06-13 LAB — SPEP & IFE WITH QIG
Alpha-1-Globulin: 4.2 % (ref 2.9–4.9)
Beta 2: 5.5 % (ref 3.2–6.5)
Gamma Globulin: 13.9 % (ref 11.1–18.8)
IgG (Immunoglobin G), Serum: 1060 mg/dL (ref 690–1700)
IgM, Serum: 107 mg/dL (ref 52–322)

## 2012-06-16 ENCOUNTER — Ambulatory Visit (HOSPITAL_BASED_OUTPATIENT_CLINIC_OR_DEPARTMENT_OTHER): Payer: BC Managed Care – PPO | Admitting: Oncology

## 2012-06-16 ENCOUNTER — Telehealth: Payer: Self-pay | Admitting: Oncology

## 2012-06-16 VITALS — BP 129/85 | HR 89 | Temp 97.8°F | Resp 18 | Ht 61.0 in | Wt 182.6 lb

## 2012-06-16 DIAGNOSIS — D472 Monoclonal gammopathy: Secondary | ICD-10-CM

## 2012-06-16 NOTE — Progress Notes (Signed)
Hematology and Oncology Follow Up Visit  Carolyn Graham [id number] 10-09-1955 57 y.o. 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  She has an IgG kappa.   Interim History:  57 year old female whom I saw for the first time back on October 14, 2010.  At that time, she was noted to have an M spike, and she was sent to me for evaluation to rule out plasma cell disorder.  My workup at this time included a repeat serum protein electrophoresis which showed an M spike of 0.36 g/dL.  Her IgG level is normal at 1390, so are her IgA and IgM.  Kappa/lambda ratios are all normal, and so are her levels.  A skeletal survey was obtained and reviewed today with [name] and showed no evidence of any lytic or sclerotic bony lesions.  Overall, [name] is relatively asymptomatic.  She does report some occasional diffuse pain in the muscles related to possibly fibromyalgia, but really no evidence of any pathological fractures, no recurrent sinopulmonary infection. No recent illness or hospitalizations.   Medications: I have reviewed the patient's current medications. Current outpatient prescriptions:CYMBALTA 30 MG capsule, 20 mg daily. , Disp: , Rfl: ;  hydrochlorothiazide (HYDRODIURIL) 25 MG tablet, , Disp: , Rfl: ;  MAGNESIUM ASPARTATE PO, Take by mouth.  , Disp: , Rfl: ;  omeprazole (PRILOSEC) 20 MG capsule, , Disp: , Rfl: ;  pramipexole (MIRAPEX) 0.5 MG tablet, Take 1 tablet (0.5 mg total) by mouth at bedtime., Disp: 90 tablet, Rfl: 1 SUMAtriptan (IMITREX) 100 MG tablet, Take 100 mg by mouth every 2 (two) hours as needed.  , Disp: , Rfl: ;  traMADol (ULTRAM) 50 MG tablet, , Disp: , Rfl: ;  valACYclovir (VALTREX) 1000 MG tablet, Take 1,000 mg by mouth as needed.  , Disp: , Rfl: ;  venlafaxine (EFFEXOR-XR) 37.5 MG 24 hr capsule, , Disp: , Rfl: ;  Vitamin D,  Ergocalciferol, (DRISDOL) 50000 UNITS CAPS, , Disp: , Rfl:  zolpidem (AMBIEN) 10 MG tablet, Take 1 tablet (10 mg total) by mouth at bedtime as needed for sleep., Disp: 30 tablet, Rfl: 5;  zolpidem (AMBIEN) 10 MG tablet, Take 1 tablet (10 mg total) by mouth at bedtime as needed for sleep., Disp: 90 tablet, Rfl: 1  Allergies:  Allergies  Allergen Reactions  . Codeine   . Darvocet (Propoxyphene-Acetaminophen)     Past Medical History, Surgical history, Social history, and Family History were reviewed and updated.  Review of Systems: Constitutional:  Negative for fever, chills, night sweats, anorexia, weight loss, pain. Cardiovascular: no chest pain or dyspnea on exertion Respiratory: no cough, shortness of breath, or wheezing Neurological: no TIA or stroke symptoms Dermatological: negative ENT: negative Skin: Negative. Gastrointestinal: negative Genito-Urinary: no dysuria, trouble voiding, or hematuria Hematological and Lymphatic: negative Breast: negative Musculoskeletal: negative Remaining ROS negative. Physical Exam: Blood pressure 129/85, pulse 89, temperature 97.8 F (36.6 C), temperature source Oral, resp. rate 18, height 5\' 1"  (1.549 m), weight 182 lb 9.6 oz (82.827 kg). ECOG: 0 General appearance: alert Head: Normocephalic, without obvious abnormality, atraumatic Neck: no adenopathy, no carotid bruit, no JVD, supple, symmetrical, trachea midline and thyroid not enlarged, symmetric, no tenderness/mass/nodules Lymph nodes: Cervical, supraclavicular, and axillary nodes normal. Heart:regular rate and rhythm, S1, S2 normal, no murmur, click, rub or gallop Lung:chest clear, no wheezing, rales, normal symmetric air entry Abdomin: soft, non-tender,  without masses or organomegaly EXT:no erythema, induration, or nodules   Lab Results: Lab Results  Component Value Date   WBC 7.8 06/09/2012   HGB 15.5 06/09/2012   HCT 46.0 06/09/2012   MCV 84.3 06/09/2012   PLT 250 06/09/2012      Chemistry      Component Value Date/Time   NA 141 06/09/2012 0801   NA 139 04/28/2011 1332   K 3.4* 06/09/2012 0801   K 3.9 04/28/2011 1332   CL 104 06/09/2012 0801   CL 103 04/28/2011 1332   CO2 24 06/09/2012 0801   CO2 22 04/28/2011 1332   BUN 16.2 06/09/2012 0801   BUN 15 04/28/2011 1332   CREATININE 0.8 06/09/2012 0801   CREATININE 0.76 04/28/2011 1332      Component Value Date/Time   CALCIUM 9.4 06/09/2012 0801   CALCIUM 9.6 04/28/2011 1332   ALKPHOS 138 06/09/2012 0801   ALKPHOS 114 04/28/2011 1332   AST 15 06/09/2012 0801   AST 55* 04/28/2011 1332   ALT 20 06/09/2012 0801   ALT 81* 04/28/2011 1332   BILITOT 0.34 06/09/2012 0801   BILITOT 0.3 04/28/2011 1332       Impression and Plan:  This is a 57 year old female with the following issues: 1. 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  Again, I do not really see any evidence to suggest end-organ damage, no evidence to suggest active multiple myeloma.  Again, in all likelihood we are dealing with either a reactive process versus monoclonal gammopathy of undetermined significance.  At this point, I will continue active surveillance at this time, repeat it in 12 months, and then we will assess the need for further surveillance after that. 2. Elevated transaminases.  She has a history of fatty liver.  At this time no connection to her monoclonal protein. This has resolved at this time.     [facility], MD 4/24/20143:52 PM

## 2012-08-02 ENCOUNTER — Ambulatory Visit: Payer: Self-pay | Admitting: Family Medicine

## 2012-08-24 ENCOUNTER — Ambulatory Visit: Payer: Self-pay | Admitting: Family Medicine

## 2012-12-09 ENCOUNTER — Ambulatory Visit: Payer: Self-pay | Admitting: Podiatry

## 2012-12-14 ENCOUNTER — Encounter: Payer: Self-pay | Admitting: Internal Medicine

## 2013-01-06 ENCOUNTER — Ambulatory Visit: Payer: Self-pay | Admitting: Podiatry

## 2013-02-23 DIAGNOSIS — I639 Cerebral infarction, unspecified: Secondary | ICD-10-CM

## 2013-02-23 HISTORY — DX: Cerebral infarction, unspecified: I63.9

## 2013-02-27 ENCOUNTER — Ambulatory Visit: Payer: Self-pay | Admitting: Family Medicine

## 2013-04-27 ENCOUNTER — Emergency Department: Payer: Self-pay | Admitting: Emergency Medicine

## 2013-04-27 LAB — URINALYSIS, COMPLETE
BILIRUBIN, UR: NEGATIVE
BLOOD: NEGATIVE
GLUCOSE, UR: NEGATIVE mg/dL (ref 0–75)
Ketone: NEGATIVE
NITRITE: NEGATIVE
PH: 6 (ref 4.5–8.0)
PROTEIN: NEGATIVE
RBC,UR: 2 /HPF (ref 0–5)
Specific Gravity: 1.011 (ref 1.003–1.030)
Transitional Epi: <1
WBC UR: 4 /HPF (ref 0–5)

## 2013-04-27 LAB — COMPREHENSIVE METABOLIC PANEL
ALBUMIN: 3.4 g/dL (ref 3.4–5.0)
AST: 26 U/L (ref 15–37)
Alkaline Phosphatase: 84 U/L
Anion Gap: 7 (ref 7–16)
BILIRUBIN TOTAL: 0.4 mg/dL (ref 0.2–1.0)
BUN: 21 mg/dL — AB (ref 7–18)
CO2: 24 mmol/L (ref 21–32)
CREATININE: 1.01 mg/dL (ref 0.60–1.30)
Calcium, Total: 8.7 mg/dL (ref 8.5–10.1)
Chloride: 105 mmol/L (ref 98–107)
GLUCOSE: 155 mg/dL — AB (ref 65–99)
Osmolality: 278 (ref 275–301)
POTASSIUM: 3.2 mmol/L — AB (ref 3.5–5.1)
SGPT (ALT): 50 U/L (ref 12–78)
SODIUM: 136 mmol/L (ref 136–145)
TOTAL PROTEIN: 6.8 g/dL (ref 6.4–8.2)

## 2013-04-27 LAB — CBC
HCT: 40.9 % (ref 35.0–47.0)
HGB: 14 g/dL (ref 12.0–16.0)
MCH: 29.5 pg (ref 26.0–34.0)
MCHC: 34.2 g/dL (ref 32.0–36.0)
MCV: 86 fL (ref 80–100)
PLATELETS: 222 10*3/uL (ref 150–440)
RBC: 4.75 10*6/uL (ref 3.80–5.20)
RDW: 13.6 % (ref 11.5–14.5)
WBC: 7 10*3/uL (ref 3.6–11.0)

## 2013-04-27 LAB — LIPASE, BLOOD: Lipase: 154 U/L (ref 73–393)

## 2013-05-16 ENCOUNTER — Encounter: Payer: Self-pay | Admitting: Neurology

## 2013-05-17 ENCOUNTER — Encounter (INDEPENDENT_AMBULATORY_CARE_PROVIDER_SITE_OTHER): Payer: Self-pay

## 2013-05-17 ENCOUNTER — Ambulatory Visit (INDEPENDENT_AMBULATORY_CARE_PROVIDER_SITE_OTHER): Payer: BC Managed Care – PPO | Admitting: Neurology

## 2013-05-17 ENCOUNTER — Encounter: Payer: Self-pay | Admitting: Neurology

## 2013-05-17 VITALS — BP 138/87 | HR 92 | Ht 62.0 in | Wt 190.0 lb

## 2013-05-17 DIAGNOSIS — M797 Fibromyalgia: Secondary | ICD-10-CM

## 2013-05-17 DIAGNOSIS — R4182 Altered mental status, unspecified: Secondary | ICD-10-CM

## 2013-05-17 DIAGNOSIS — IMO0001 Reserved for inherently not codable concepts without codable children: Secondary | ICD-10-CM

## 2013-05-17 NOTE — Patient Instructions (Signed)
Overall you are doing fairly well but I do want to suggest a few things today:   Remember to drink plenty of fluid, eat healthy meals and do not skip any meals. Try to eat protein with a every meal and eat a healthy snack such as fruit or nuts in between meals. Try to keep a regular sleep-wake schedule and try to exercise daily, particularly in the form of walking, 20-30 minutes a day, if you can.   As far as your medications are concerned, I would like to suggest you continue to take the Aspirin daily  As far as diagnostic testing:  1)I would like you to have a head CT, you will be called to schedule this 2) I would like you to have an EEG, you will be called to schedule this  Follow up as needed. Please call us with any interim questions, concerns, problems, updates or refill requests.   My clinical assistant and will answer any of your questions and relay your messages to me and also relay most of my messages to you.   Our phone number is [phone]. We also have an after hours call service for urgent matters and there is a physician on-call for urgent questions. For any emergencies you know to call 911 or go to the nearest emergency room

## 2013-05-17 NOTE — Progress Notes (Signed)
[facility]    Provider:  Dr Janann Colonel Referring Provider: Juluis Pitch, MD Primary Care Physician:  Carolyn Pitch, MD  CC:  Question of TIA  HPI:  Carolyn Graham is a 58 y.o. female here as a referral from Dr. Lovie Macadamia for question of TIA  On March 5, recalls going to get breakfast and heading to work. Recalls some conversations in the morning with co-workers and then next thing she remembers is waking up in the hospital. Co-workers note that she was not her normal self, they report she was saying she couldn't see, wasn't making sense. No noted eye blinking, lip smacking, extremity shaking. EMS called and transferred to [facility]. She believes she has lost a gap of time from 8am to 1:30pm. States while in the hospital had some blood work done, had CT chest and was found to have low K. States she was released later that day, felt very fatigued since then. Was told by the ED that her symptoms were likely related to dehydration.   Notes it was a typical morning prior to leaving the house, husband did not notice anything abnormal. States she did not sleep well the night before. 4 to 5 days prior had a mild GI bug. Denies any symptoms like this in the past. Has HTN, HLD. No DM. Currently taking a daily ASA 81mg , was not prior to this event.   Has history of fibromyalgia and disc disease.   Review of Systems: Out of a complete 14 system review, the patient complains of only the following symptoms, and all other reviewed systems are negative. + blurred vision, double vision, fatigue, feeling hot, snoring, joint pain, cramps, aching muscles, sleepiness, snoring, restless legs  History   Social History  . Marital Status: Married    Spouse Name: N/A    Number of Children: 0  . Years of Education: N/A   Occupational History  . Civil engineer, contracting     Social History Main Topics  . Smoking status: Never Smoker   . Smokeless tobacco: Never Used  . Alcohol Use: Yes     Comment:  occ  . Drug Use: No  . Sexual Activity: Yes    Partners: Female   Other Topics Concern  . Not on file   Social History Narrative   Married to [place], no natural children   Right handed   Bachelor's degree   2-3 cups daily          Family History  Problem Relation Age of Onset  . Colon cancer Mother 8  . Lung cancer Father 83    Past Medical History  Diagnosis Date  . Hypertension   . Headache(784.0)   . Hypercholesteremia   . Thyroiditis   . OSA (obstructive sleep apnea)   . GERD (gastroesophageal reflux disease)   . Restless leg syndrome   . Fibromyalgia   . Fatty liver   . Schatzki's ring     Past Surgical History  Procedure Laterality Date  . Cervical disc surgery      cervical decompression c4-5 and c5-6  . Shoulder surgery    . Trigger finger release      Current Outpatient Prescriptions  Medication Sig Dispense Refill  . betamethasone valerate (VALISONE) 0.1 % cream       . CRESTOR 10 MG tablet       . CYMBALTA 30 MG capsule 20 mg daily.       . Diclofenac Sodium (VOLTAREN PO) Take by mouth.      Marland Kitchen  hydrochlorothiazide (HYDRODIURIL) 25 MG tablet       . MAGNESIUM ASPARTATE PO Take by mouth.        . meloxicam (MOBIC) 15 MG tablet Take 15 mg by mouth daily.      Marland Kitchen omeprazole (PRILOSEC) 20 MG capsule       . SUMAtriptan (IMITREX) 100 MG tablet Take 100 mg by mouth every 2 (two) hours as needed.        Marland Kitchen tiZANidine (ZANAFLEX) 4 MG tablet Take 4 mg by mouth at bedtime as needed.      . traMADol (ULTRAM) 50 MG tablet       . valACYclovir (VALTREX) 1000 MG tablet Take 1,000 mg by mouth as needed.        . Vitamin D, Ergocalciferol, (DRISDOL) 50000 UNITS CAPS       . pramipexole (MIRAPEX) 0.5 MG tablet Take 1 tablet (0.5 mg total) by mouth at bedtime.  90 tablet  1  . zolpidem (AMBIEN) 10 MG tablet Take 1 tablet (10 mg total) by mouth at bedtime as needed for sleep.  90 tablet  1   No current facility-administered medications for this visit.     Allergies as of 05/17/2013 - Review Complete 05/17/2013  Allergen Reaction Noted  . Codeine  12/15/2007  . Darvocet [propoxyphene n-acetaminophen]  01/05/2011    Vitals: BP 138/87  Pulse 92  Ht 5\' 2"  (1.575 m)  Wt 190 lb (86.183 kg)  BMI 34.74 kg/m2 Last Weight:  Wt Readings from Last 1 Encounters:  05/17/13 190 lb (86.183 kg)   Last Height:   Ht Readings from Last 1 Encounters:  05/17/13 5\' 2"  (1.575 m)     Physical exam: Exam: Gen: NAD, conversant Eyes: anicteric sclerae, moist conjunctivae HENT: Atraumatic, oropharynx clear Neck: Trachea midline; supple,  Lungs: CTA, no wheezing, rales, rhonic                          CV: RRR, no MRG Abdomen: Soft, non-tender;  Extremities: No peripheral edema  Skin: Normal temperature, no rash,  Psych: Appropriate affect, pleasant  Neuro: MS: AA&Ox3, appropriately interactive, normal affect   Attention: WORLD backwards  Speech: fluent w/o paraphasic error  Memory: good recent and remote recall  CN: PERRL, EOMI no nystagmus, no ptosis, sensation intact to LT V1-V3 bilat, face symmetric, no weakness, hearing grossly intact, palate elevates symmetrically, shoulder shrug 5/5 bilat,  tongue protrudes midline, no fasiculations noted.  Motor: normal bulk and tone Strength: 5/5  In all extremities  Coord: rapid alternating and point-to-point (FNF, HTS) movements intact.  Reflexes: symmetrical, bilat downgoing toes  Sens: LT intact in all extremities  Gait: posture, stance, stride and arm-swing normal. Tandem gait intact. Able to walk on heels and toes. Romberg absent.   Assessment:  After physical and neurologic examination, review of laboratory studies, imaging, neurophysiology testing and pre-existing records, assessment will be reviewed on the problem list.  Plan:  Treatment plan and additional workup will be reviewed under Problem List.  1)AMS 2)Fibromyalgia 3)OSA 58y/o woman presenting for initial evaluation  of period of altered mental status which was diagnosed as dehydration by the local ED. Based on clinical history her symptoms could be consistent with dehydration but the differential would also include TIA vs complex partial seizure. Will continue patient on ASA 81mg  daily, will check head CT and EEG. Follow up once workup completed.   Jim Like, DO  [facility] [place]  [place], [place] [address]  Phone [phone] Fax [phone]

## 2013-05-24 ENCOUNTER — Ambulatory Visit (INDEPENDENT_AMBULATORY_CARE_PROVIDER_SITE_OTHER): Payer: BC Managed Care – PPO

## 2013-05-24 DIAGNOSIS — R4182 Altered mental status, unspecified: Secondary | ICD-10-CM

## 2013-05-24 NOTE — Procedures (Signed)
    History:  Carolyn Graham is a 58 year old patient with a history of amnesia on 04/27/2013. The patient cannot recall events 20 a.m. to 1:30 PM on that date. The patient is being evaluated for this event.  This is a routine EEG. No skull defects are noted. Medications include Crestor, Cymbalta, diclofenac, hydrochlorothiazide, Mobic, Prilosec, Mirapex, Imitrex, Zanaflex, Ultram, Valtrex, vitamin D, and Ambien.   EEG classification:  Essentially normal awake  Description of the recording: The background rhythms of this recording consists of a fairly well modulated medium amplitude alpha rhythm of 8 Hz that is reactive to eye opening and closure. As the record progresses, the patient appears to remain in the waking state throughout the recording. Photic stimulation was performed, resulting in a bilateral and symmetric photic driving response. Hyperventilation was also performed, resulting in a minimal buildup of the background rhythm activities without significant slowing seen. At no time during the recording does there appear to be evidence of spike or spike wave discharges or evidence of focal slowing. EKG monitor shows no evidence of cardiac rhythm abnormalities with a heart rate of 78.  Impression: This is an essentially normal EEG recording in the waking state. No evidence of ictal or interictal discharges are seen.

## 2013-05-29 ENCOUNTER — Other Ambulatory Visit: Payer: Self-pay | Admitting: Neurology

## 2013-05-29 NOTE — Progress Notes (Signed)
Quick Note:  Patient was scheduled on 0330/15, however she was not informed of the appointment, patient states that she will call and get an appointment for the CT scan, apologized to patient for the misunderstanding. ______

## 2013-05-29 NOTE — Progress Notes (Signed)
Quick Note:  Spoke with patient about normal EEG results, patient stated that she is still waiting for her CT to be scheduled. Checked the workqueue, nothing on her, please advise ______

## 2013-06-01 ENCOUNTER — Ambulatory Visit: Payer: Self-pay | Admitting: Neurology

## 2013-06-08 ENCOUNTER — Telehealth: Payer: Self-pay | Admitting: Neurology

## 2013-06-08 NOTE — Telephone Encounter (Signed)
Sending information to [facility], Dr. Leta Baptist. Please advise

## 2013-06-08 NOTE — Telephone Encounter (Signed)
Called pt to inform her that Dr. Janann Colonel is out of the office today and tomorrow and will be back on Monday. I informed the pt that we do not have the CT scan results and if she could call [facility] and request that they send the results to Dr. Janann Colonel so that he would have them, when he gets back. Pt verbalized understanding.

## 2013-06-08 NOTE — Telephone Encounter (Signed)
Called pt to inform her per Dr. Leta Baptist, [facility], that the pt CT scan results were normal and if she has any other problems, questions or concerns to call the office. Pt verbalized understanding.

## 2013-06-08 NOTE — Telephone Encounter (Signed)
Pt called states she needs to speak with Dr. Janann Colonel or his nurse concerning her test results from her CT. Also states Dr. Janann Colonel advised her to schedule an apt for a f/u from the test but she doesn't want to schedule until she knows he has the results. Please call pt concerning this matter. Thanks

## 2013-06-16 ENCOUNTER — Other Ambulatory Visit (HOSPITAL_BASED_OUTPATIENT_CLINIC_OR_DEPARTMENT_OTHER): Payer: BC Managed Care – PPO

## 2013-06-16 DIAGNOSIS — D472 Monoclonal gammopathy: Secondary | ICD-10-CM

## 2013-06-16 DIAGNOSIS — D7289 Other specified disorders of white blood cells: Secondary | ICD-10-CM

## 2013-06-16 LAB — CBC WITH DIFFERENTIAL/PLATELET
BASO%: 1.2 % (ref 0.0–2.0)
Basophils Absolute: 0.1 10*3/uL (ref 0.0–0.1)
EOS%: 1.2 % (ref 0.0–7.0)
Eosinophils Absolute: 0.1 10*3/uL (ref 0.0–0.5)
HCT: 47.6 % — ABNORMAL HIGH (ref 34.8–46.6)
HGB: 15.7 g/dL (ref 11.6–15.9)
LYMPH%: 24.6 % (ref 14.0–49.7)
MCH: 28.2 pg (ref 25.1–34.0)
MCHC: 33 g/dL (ref 31.5–36.0)
MCV: 85.6 fL (ref 79.5–101.0)
MONO#: 0.7 10*3/uL (ref 0.1–0.9)
MONO%: 10 % (ref 0.0–14.0)
NEUT#: 4.5 10*3/uL (ref 1.5–6.5)
NEUT%: 63 % (ref 38.4–76.8)
PLATELETS: 257 10*3/uL (ref 145–400)
RBC: 5.56 10*6/uL — AB (ref 3.70–5.45)
RDW: 14 % (ref 11.2–14.5)
WBC: 7.1 10*3/uL (ref 3.9–10.3)
lymph#: 1.8 10*3/uL (ref 0.9–3.3)

## 2013-06-16 LAB — COMPREHENSIVE METABOLIC PANEL (CC13)
ALBUMIN: 4.1 g/dL (ref 3.5–5.0)
ALK PHOS: 99 U/L (ref 40–150)
ALT: 31 U/L (ref 0–55)
AST: 22 U/L (ref 5–34)
Anion Gap: 14 mEq/L — ABNORMAL HIGH (ref 3–11)
BILIRUBIN TOTAL: 0.48 mg/dL (ref 0.20–1.20)
BUN: 17.2 mg/dL (ref 7.0–26.0)
CO2: 27 mEq/L (ref 22–29)
Calcium: 10.1 mg/dL (ref 8.4–10.4)
Chloride: 101 mEq/L (ref 98–109)
Creatinine: 0.9 mg/dL (ref 0.6–1.1)
GLUCOSE: 103 mg/dL (ref 70–140)
POTASSIUM: 3.5 meq/L (ref 3.5–5.1)
SODIUM: 142 meq/L (ref 136–145)
TOTAL PROTEIN: 7.9 g/dL (ref 6.4–8.3)

## 2013-06-21 LAB — SPEP & IFE WITH QIG
ALBUMIN ELP: 54.8 % — AB (ref 55.8–66.1)
Alpha-1-Globulin: 5 % — ABNORMAL HIGH (ref 2.9–4.9)
Alpha-2-Globulin: 13.2 % — ABNORMAL HIGH (ref 7.1–11.8)
BETA GLOBULIN: 6.4 % (ref 4.7–7.2)
Beta 2: 5.9 % (ref 3.2–6.5)
Gamma Globulin: 14.7 % (ref 11.1–18.8)
IGA: 172 mg/dL (ref 69–380)
IGG (IMMUNOGLOBIN G), SERUM: 1120 mg/dL (ref 690–1700)
IgM, Serum: 117 mg/dL (ref 52–322)
TOTAL PROTEIN, SERUM ELECTROPHOR: 7.4 g/dL (ref 6.0–8.3)

## 2013-06-21 LAB — KAPPA/LAMBDA LIGHT CHAINS
KAPPA LAMBDA RATIO: 4.65 — AB (ref 0.26–1.65)
Kappa free light chain: 0.93 mg/dL (ref 0.33–1.94)
LAMBDA FREE LGHT CHN: 0.2 mg/dL — AB (ref 0.57–2.63)

## 2013-06-23 ENCOUNTER — Encounter: Payer: Self-pay | Admitting: Oncology

## 2013-06-23 ENCOUNTER — Ambulatory Visit (HOSPITAL_BASED_OUTPATIENT_CLINIC_OR_DEPARTMENT_OTHER): Payer: BC Managed Care – PPO | Admitting: Oncology

## 2013-06-23 VITALS — BP 164/95 | HR 99 | Temp 97.6°F | Resp 18 | Ht 62.0 in | Wt 187.6 lb

## 2013-06-23 DIAGNOSIS — D472 Monoclonal gammopathy: Secondary | ICD-10-CM

## 2013-06-23 DIAGNOSIS — D890 Polyclonal hypergammaglobulinemia: Principal | ICD-10-CM

## 2013-06-23 NOTE — Progress Notes (Signed)
Hematology and Oncology Follow Up Visit  Carolyn Graham [id number] [birth year] 58 y.o. 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   Interim History:  58 year old female returns for a followup visit for the evaluation of a possible plasma cell disorder. Her workup in the past have been unrevealing. Overall, Carolyn Graham is relatively asymptomatic.  She does report some occasional diffuse pain in the muscles related to possibly fibromyalgia, but really no evidence of any pathological fractures, no recurrent sinopulmonary infection. She did have an episode of alternate status and questionable TIA in the last few months. She is currently in reasonable health and shape without any new complaints. She reports that she had not had any other episodes since that point. She is not reporting any headaches or seizure activity at this time.  Medications: I have reviewed the patient's current medications.   Current Outpatient Prescriptions  Medication Sig Dispense Refill  . Alum & Mag Hydroxide-Simeth (MAGIC MOUTHWASH) SOLN Take 5 mLs by mouth 4 (four) times daily as needed for mouth pain.      Marland Kitchen aspirin 81 MG tablet Take 81 mg by mouth daily.      . betamethasone valerate (VALISONE) 0.1 % cream       . CRESTOR 10 MG tablet       . CYMBALTA 30 MG capsule 20 mg daily.       . Diclofenac Sodium (VOLTAREN PO) Take by mouth.      . hydrochlorothiazide (HYDRODIURIL) 25 MG tablet       . hydrocortisone (CORTEF) 20 MG tablet       . MAGNESIUM ASPARTATE PO Take by mouth.        . meloxicam (MOBIC) 15 MG tablet Take 15 mg by mouth daily.      Marland Kitchen omeprazole (PRILOSEC) 20 MG capsule       . pramipexole (MIRAPEX) 0.5 MG tablet Take 1 tablet (0.5 mg total) by mouth at bedtime.  90 tablet  1  . SUMAtriptan (IMITREX) 100 MG tablet Take 100 mg by  mouth every 2 (two) hours as needed.        Marland Kitchen tiZANidine (ZANAFLEX) 4 MG tablet Take 4 mg by mouth at bedtime as needed.      . traMADol (ULTRAM) 50 MG tablet       . valACYclovir (VALTREX) 1000 MG tablet Take 1,000 mg by mouth as needed.        . Vitamin D, Ergocalciferol, (DRISDOL) 50000 UNITS CAPS       . zolpidem (AMBIEN) 10 MG tablet Take 1 tablet (10 mg total) by mouth at bedtime as needed for sleep.  90 tablet  1   No current facility-administered medications for this visit.    Allergies:  Allergies  Allergen Reactions  . Codeine   . Darvocet [Propoxyphene N-Acetaminophen]     Past Medical History, Surgical history, Social history, and Family History were reviewed and updated.  Review of Systems: Constitutional:  Negative for fever, chills, night sweats, anorexia, weight loss, pain. Cardiovascular: no chest pain or dyspnea on exertion Respiratory: no cough, shortness of breath, or wheezing  Remaining ROS negative. Physical Exam: Blood pressure 164/95, pulse 99, temperature 97.6 F (36.4 C), temperature source Oral, resp. rate 18, height 5\' 2"  (1.575 m), weight 187 lb 9.6 oz (85.095 kg), SpO2 100.00%. ECOG: 0 General  appearance: alert awake not in any distress. Head: Normocephalic, without obvious abnormality, atraumatic Neck: no adenopathy, no carotid bruit, no JVD, supple, symmetrical, trachea midline and thyroid not enlarged, symmetric, no tenderness/mass/nodules Lymph nodes: Cervical, supraclavicular, and axillary nodes normal. Heart:regular rate and rhythm, S1, S2 normal, no murmur, click, rub or gallop Lung:chest clear, no wheezing, rales, normal symmetric air entry Abdomin: soft, non-tender, without masses or organomegaly EXT:no erythema, induration, or nodules   Lab Results: Lab Results  Component Value Date   WBC 7.1 06/16/2013   HGB 15.7 06/16/2013   HCT 47.6* 06/16/2013   MCV 85.6 06/16/2013   PLT 257 06/16/2013     Chemistry      Component Value  Date/Time   NA 142 06/16/2013 0759   NA 139 04/28/2011 1332   K 3.5 06/16/2013 0759   K 3.9 04/28/2011 1332   CL 104 06/09/2012 0801   CL 103 04/28/2011 1332   CO2 27 06/16/2013 0759   CO2 22 04/28/2011 1332   BUN 17.2 06/16/2013 0759   BUN 15 04/28/2011 1332   CREATININE 0.9 06/16/2013 0759   CREATININE 0.76 04/28/2011 1332      Component Value Date/Time   CALCIUM 10.1 06/16/2013 0759   CALCIUM 9.6 04/28/2011 1332   ALKPHOS 99 06/16/2013 0759   ALKPHOS 114 04/28/2011 1332   AST 22 06/16/2013 0759   AST 55* 04/28/2011 1332   ALT 31 06/16/2013 0759   ALT 81* 04/28/2011 1332   BILITOT 0.48 06/16/2013 0759   BILITOT 0.3 04/28/2011 1332     Results for Carolyn Graham (MRN [id number]) as of 06/23/2013 15:54  Ref. Range 04/28/2011 13:32 06/09/2012 08:01 06/16/2013 07:59 06/16/2013 07:59  M-SPIKE, % No range found NOT DET NOT DET  NOT DET  SPE Interp. No range found * *  *   Results for Carolyn Graham (MRN [id number]) as of 06/23/2013 15:54  Ref. 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 Her repeat serum protein electrophoresis have not detected an M spike the last 2 years. Her quantitative immunoglobulins are all normal. Her immunofixation is no longer revealing that monoclonality. This is unlikely to represent a plasma cell disorder and most likely have represented a reactive process that resolved. I see no further testing is needed at this time and I will be happy to reevaluate this in the future as needed. 2. Elevated transaminases.  As has resolved at this time.    Carolyn Portela, MD 5/1/20154:20 PM

## 2013-06-27 ENCOUNTER — Telehealth: Payer: Self-pay | Admitting: Neurology

## 2013-06-27 ENCOUNTER — Other Ambulatory Visit: Payer: Self-pay | Admitting: Neurology

## 2013-06-27 DIAGNOSIS — G4733 Obstructive sleep apnea (adult) (pediatric): Secondary | ICD-10-CM

## 2013-06-27 NOTE — Telephone Encounter (Signed)
Pt calls sleep lab today to request a sleep study.  She explains that she was diagnosed with osa in 2010 and has not been followed much since this time.   She has also had the same CPAP unit.  She does not feel that her CPAP is functioning properly, but reports having a TIA on 04/28/2013 so originally prescribed settings may not be appropriate.  Pt says that Dr. Janann Colonel wanted her to complete all other testing before submitting referral for sleep evaluation.  I have advised pt that I would send notification to Dr. Janann Colonel to request a referral for a sleep study.

## 2013-06-27 NOTE — Telephone Encounter (Signed)
Referral placed for sleep study. Thanks.

## 2013-06-28 NOTE — Telephone Encounter (Signed)
Pt arrives at sleep lab today with her Respironics System One CPAP machine to have it evaluated.  She feels it is not giving her enough pressure.  She states she is using it nightly and also snoring while wearing the mask.  Her husband also uses a CPAP at home.  Download of card shows she is using the machine at 10 cm and that her days used at greater than 4 hours per night in the past 30 days is 46.7%.  She states she may be taking the mask off in her sleep because she is insistent she is using it more than that.  Her average AHI in the past 30 days while in therapy is 10.1/hr - too high.  Her residual AHI, along with her report of snoring and feeling like she is not getting enough pressure are reason for [place] to make a small change to her pressure.  Her CPAP machine has the capability to be programmed as an auto CPAP and this is done in office today and set with a pressure window of 10 cm - 14 cm.  Pt is instructed and understands that this change will allow the machine to flex upwards if she requires more pressure during the night.  She has some question as to the well being of her machine and the humidifier, from what I can tell it looks good.  I referred her to [facility] (where she purchased the machine in 2010 through her previous insurance) and gave her that information so she could contact them about these questions.    We briefly discuss her referral for a new sleep study.  She has gained weight since her previous test and she suffered a TIA back in March 2015.  She also shows a history of RLS which is treated with Mirapex and long standing insomnia for which the records show she takes Ambien chronically.  She is anxious to have new study done.  We went ahead and scheduled her Split Night appointment for August 03, 2013 at 9:30 PM so that she would have her place held.  She understands that her insurance may require a face to face visit with Dr. Rexene Alberts or Dr. Brett Fairy in order to document current sleep symptoms  and needs if they have not been recently documented.  This is to help ensure her test and equipment are covered and will also establish her with follow up care for her sleep disorders.  We also discussed the direct referral option, and medical review process.    She understands she should hear from one of our coordinators by end of week on her insurance benefits and if she needs to schedule a consult, she will be able to go forward with that and schedule with them at that time.  She is also instructed she will receive paperwork by mail from our sleep lab coordinators regarding her appointment.

## 2013-06-30 ENCOUNTER — Telehealth: Payer: Self-pay | Admitting: Neurology

## 2013-06-30 ENCOUNTER — Encounter: Payer: Self-pay | Admitting: Neurology

## 2013-06-30 DIAGNOSIS — G4733 Obstructive sleep apnea (adult) (pediatric): Secondary | ICD-10-CM

## 2013-06-30 DIAGNOSIS — G459 Transient cerebral ischemic attack, unspecified: Secondary | ICD-10-CM

## 2013-06-30 DIAGNOSIS — E669 Obesity, unspecified: Secondary | ICD-10-CM

## 2013-06-30 NOTE — Telephone Encounter (Signed)
. °  Dr. Jim Like is referring Carolyn Graham, 58 y.o. female, for an attended sleep study.  Wt: 190 lbs. Ht: 62 in. BMI: 34.74  Diagnoses: Obstructive Sleep Apnea TIA - 04/28/2013 Obesity Hypertension Headache Hypercholesterolemia Restless Leg Syndrome  Medication List: Current Outpatient Prescriptions  Medication Sig Dispense Refill   Alum & Mag Hydroxide-Simeth (MAGIC MOUTHWASH) SOLN Take 5 mLs by mouth 4 (four) times daily as needed for mouth pain.       aspirin 81 MG tablet Take 81 mg by mouth daily.       betamethasone valerate (VALISONE) 0.1 % cream        CRESTOR 10 MG tablet        CYMBALTA 30 MG capsule 20 mg daily.        Diclofenac Sodium (VOLTAREN PO) Take by mouth.       hydrochlorothiazide (HYDRODIURIL) 25 MG tablet        hydrocortisone (CORTEF) 20 MG tablet        MAGNESIUM ASPARTATE PO Take by mouth.         meloxicam (MOBIC) 15 MG tablet Take 15 mg by mouth daily.       omeprazole (PRILOSEC) 20 MG capsule        pramipexole (MIRAPEX) 0.5 MG tablet Take 1 tablet (0.5 mg total) by mouth at bedtime.  90 tablet  1   SUMAtriptan (IMITREX) 100 MG tablet Take 100 mg by mouth every 2 (two) hours as needed.         tiZANidine (ZANAFLEX) 4 MG tablet Take 4 mg by mouth at bedtime as needed.       traMADol (ULTRAM) 50 MG tablet        valACYclovir (VALTREX) 1000 MG tablet Take 1,000 mg by mouth as needed.         Vitamin D, Ergocalciferol, (DRISDOL) 50000 UNITS CAPS        zolpidem (AMBIEN) 10 MG tablet Take 1 tablet (10 mg total) by mouth at bedtime as needed for sleep.  90 tablet  1   No current facility-administered medications for this visit.    This patient presents to Dr. Jim Like for initial evaluation of period of altered mental status which could include a possible TIA.   Pt was dx'd with osa in 2010 and may apparently have also been diagnosed with restless legs at this time.  The physician who was treating her left the practice and the  patient has not been followed much since.  She complains of snoring even while she is using CPAP - machine no longer feels right.  The pt was urged to bring her chip in so that we could obtain a dl.  Download indicates an elevated AHI of 10.  Please review for urgent attended sleep study.  Insurance:  [employer] - [place] not required - Coverage is 100%

## 2013-07-13 NOTE — Telephone Encounter (Signed)
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 Get old sleep study and EPWORTH score, FSS score.

## 2013-07-22 ENCOUNTER — Encounter: Payer: Self-pay | Admitting: Internal Medicine

## 2013-08-03 ENCOUNTER — Ambulatory Visit (INDEPENDENT_AMBULATORY_CARE_PROVIDER_SITE_OTHER): Payer: BC Managed Care – PPO | Admitting: Neurology

## 2013-08-03 DIAGNOSIS — G459 Transient cerebral ischemic attack, unspecified: Secondary | ICD-10-CM

## 2013-08-03 DIAGNOSIS — E669 Obesity, unspecified: Secondary | ICD-10-CM

## 2013-08-03 DIAGNOSIS — G4733 Obstructive sleep apnea (adult) (pediatric): Secondary | ICD-10-CM

## 2013-08-17 ENCOUNTER — Telehealth: Payer: Self-pay | Admitting: Neurology

## 2013-08-17 NOTE — Telephone Encounter (Signed)
I called and left a message for the patient about her recent sleep study results. I informed the patient that the study revealed severe obstructive sleep apnea and that she did well on CPAP during the night of her study with significant improvement of respiratory events. Dr. Brett Fairy recommend starting CPAP therapy at home . I will send the order to the patient choice of durable medical equipment. I will send a copy of the report to Dr. Janann Colonel and patient's PCP. I will mail a copy of the report to the patient along with a follow up instruction letter.

## 2013-08-18 ENCOUNTER — Encounter: Payer: Self-pay | Admitting: *Deleted

## 2013-08-18 ENCOUNTER — Other Ambulatory Visit: Payer: Self-pay | Admitting: *Deleted

## 2013-08-18 DIAGNOSIS — G4733 Obstructive sleep apnea (adult) (pediatric): Secondary | ICD-10-CM

## 2013-08-18 NOTE — Telephone Encounter (Signed)
I called and spoke with the patient about which DME company she wanted her order for CPAP to sent. Patient stated she will continue to use [employer].

## 2013-10-20 ENCOUNTER — Encounter: Payer: Self-pay | Admitting: Podiatry

## 2013-10-20 ENCOUNTER — Ambulatory Visit (INDEPENDENT_AMBULATORY_CARE_PROVIDER_SITE_OTHER): Payer: BC Managed Care – PPO | Admitting: Podiatry

## 2013-10-20 VITALS — BP 129/81 | HR 72 | Resp 16 | Ht 61.0 in | Wt 190.0 lb

## 2013-10-20 DIAGNOSIS — M722 Plantar fascial fibromatosis: Secondary | ICD-10-CM

## 2013-10-20 MED ORDER — TRIAMCINOLONE ACETONIDE 10 MG/ML IJ SUSP
10.0000 mg | Freq: Once | INTRAMUSCULAR | Status: AC
  Administered 2013-10-20: 10 mg

## 2013-10-20 NOTE — Progress Notes (Signed)
Subjective:     Patient ID: Carolyn Graham, female   DOB: 09/08/1955, 58 y.o.   MRN: [id number]  HPI patient presents stating she has developed quite a bit of increase in discomfort between both feet with the right foot being directly in the plantar heel in the left being in a more distal fashion   Review of Systems     Objective:   Physical Exam Neurovascular status intact with severe discomfort in the right plantar heel and moderate to severe discomfort in the left distal plantar fascia and moderate discomfort in the plantar heel    Assessment:     Plantar fasciitis both feet    Plan:     Injected the right plantar fascia 3 mg Kenalog 5 mg Xylocaine Marcaine mixture and the left distal fascia 3 mg Kenalog 5 mg

## 2013-10-20 NOTE — Patient Instructions (Signed)

## 2013-10-27 ENCOUNTER — Ambulatory Visit (INDEPENDENT_AMBULATORY_CARE_PROVIDER_SITE_OTHER): Payer: BC Managed Care – PPO | Admitting: Podiatry

## 2013-10-27 VITALS — BP 115/75 | HR 85 | Resp 16

## 2013-10-27 DIAGNOSIS — M722 Plantar fascial fibromatosis: Secondary | ICD-10-CM

## 2013-10-27 MED ORDER — TRIAMCINOLONE ACETONIDE 10 MG/ML IJ SUSP: 10.0000 mg | Freq: Once | INTRAMUSCULAR | Status: AC

## 2013-10-27 NOTE — Progress Notes (Signed)
Subjective:     Patient ID: Carolyn Graham, female   DOB: August 02, 1955, 58 y.o.   MRN: [id number]  HPI patient states that I'm still having pain in the plantar of my right heel and my left arch is doing significantly better   Review of Systems     Objective:   Physical Exam Neurovascular status intact with discomfort in the plantar arch right still noted upon palpation at the insertion into the calcaneus    Assessment:     Continue plantar fasciitis right with improved arch left    Plan:     Reviewed physical therapy and today I recommended continued night splint usage and the one area of discomfort I reinjected 3 mg Kenalog 5 mg Xylocaine and I scanned for new orthotics in order to get better and proper lift the arch

## 2013-11-13 ENCOUNTER — Encounter: Payer: Self-pay | Admitting: *Deleted

## 2013-11-23 DIAGNOSIS — S82899A Other fracture of unspecified lower leg, initial encounter for closed fracture: Secondary | ICD-10-CM

## 2013-11-23 HISTORY — DX: Other fracture of unspecified lower leg, initial encounter for closed fracture: S82.899A

## 2013-12-04 ENCOUNTER — Telehealth: Payer: Self-pay | Admitting: Neurology

## 2013-12-04 NOTE — Telephone Encounter (Signed)
Pt called to request her CPAP referral to be sent to [facility].  Order, sleep study, and referring provider notes have been faxed.

## 2013-12-14 ENCOUNTER — Ambulatory Visit (INDEPENDENT_AMBULATORY_CARE_PROVIDER_SITE_OTHER): Payer: BC Managed Care – PPO | Admitting: Podiatry

## 2013-12-14 ENCOUNTER — Ambulatory Visit (INDEPENDENT_AMBULATORY_CARE_PROVIDER_SITE_OTHER): Payer: BC Managed Care – PPO

## 2013-12-14 ENCOUNTER — Encounter: Payer: Self-pay | Admitting: Podiatry

## 2013-12-14 ENCOUNTER — Encounter: Payer: Self-pay | Admitting: *Deleted

## 2013-12-14 VITALS — BP 116/71 | HR 80 | Resp 16

## 2013-12-14 DIAGNOSIS — M779 Enthesopathy, unspecified: Secondary | ICD-10-CM

## 2013-12-14 DIAGNOSIS — S82891A Other fracture of right lower leg, initial encounter for closed fracture: Secondary | ICD-10-CM

## 2013-12-14 NOTE — Progress Notes (Addendum)
Patient ID: Carolyn Graham, female   DOB: [birth year], 58 y.o.   MRN: [id number]  Subjective: Carolyn Graham presents the office today with complaints of right ankle pain. She states on Saturday she was hiking and she twisted her ankle after falling off a [place]. She states that she was unable to complete her height. She previously has been treated for plantar fasciitis and had a cam boot at home for which she has been wearing. She has not been walking much since Sunday. Denies any other injuries at the time of the accident. No other complaints at this time.  Objective: AAO x3, NAD DP/PT pulses palpable bilaterally, CRT less than 3 seconds Protective sensation intact with Simms Weinstein monofilament Moderate edema to the right ankle. Mild ecchymosis lateral ankle/foot. Mild tenderness to palpation over the fifth metatarsal base on the right. Tenderness to palpation of the distal aspect of the right fibula. No pain along the course of the syndesmosis, medial malleolus, and deltoid ligaments. No proximal fibular pain. Decreased range of motion and MMT on the right secondary to injury. No open lesions.  Assessment: 58 year old female with a right fibular fracture, possible fifth metatarsal base fracture  Plan: -X-rays were obtained and reviewed the patient. -Conservative versus surgical treatment discussed including alternatives, risks, complications. -At this time the ankle appears to be stable and the fracture is in decent alignment. Discussed that if the fracture becomes unstable making need surgical intervention. -Continue CAM boot. -Prescribed crutches -Limit weightbearing.  -Ice and elevation. -Discussed that she cannot drive while wearing the boot. -Followup in 2 weeks for repeat x-rays or sooner if any problems are to arise or any change in symptoms. In the meantime call the office with any questions, concerns.

## 2013-12-14 NOTE — Patient Instructions (Signed)
Ankle Fracture  A fracture is a break in a bone. The ankle joint is made up of three bones. These include the lower (distal)sections of your lower leg bones, called the tibia and fibula, along with a bone in your foot, called the talus. Depending on how bad the break is and if more than one ankle joint bone is broken, a cast or splint is used to protect and keep your injured bone from moving while it heals. Sometimes, surgery is required to help the fracture heal properly.   There are two general types of fractures:   Stable fracture. This includes a single fracture line through one bone, with no injury to ankle ligaments. A fracture of the talus that does not have any displacement (movement of the bone on either side of the fracture line) is also stable.   Unstable fracture. This includes more than one fracture line through one or more bones in the ankle joint. It also includes fractures that have displacement of the bone on either side of the fracture line.  CAUSES   A direct blow to the ankle.    Quickly and severely twisting your ankle.   Trauma, such as a car accident or falling from a significant height.  RISK FACTORS  You may be at a higher risk of ankle fracture if:   You have certain medical conditions.   You are involved in high-impact sports.   You are involved in a high-impact car accident.  SIGNS AND SYMPTOMS    Tender and swollen ankle.   Bruising around the injured ankle.   Pain on movement of the ankle.   Difficulty walking or putting weight on the ankle.   A cold foot below the site of the ankle injury. This can occur if the blood vessels passing through your injured ankle were also damaged.   Numbness in the foot below the site of the ankle injury.  DIAGNOSIS   An ankle fracture is usually diagnosed with a physical exam and X-rays. A CT scan may also be required for complex fractures.  TREATMENT   Stable fractures are treated with a cast or splint and using crutches to avoid putting  weight on your injured ankle. This is followed by an ankle strengthening program. Some patients require a special type of cast, depending on other medical problems they may have. Unstable fractures require surgery to ensure the bones heal properly. Your health care provider will tell you what type of fracture you have and the best treatment for your condition.  HOME CARE INSTRUCTIONS    Review correct crutch use with your health care provider and use your crutches as directed. Safe use of crutches is extremely important. Misuse of crutches can cause you to fall or cause injury to nerves in your hands or armpits.   Do not put weight or pressure on the injured ankle until directed by your health care provider.   To lessen the swelling, keep the injured leg elevated while sitting or lying down.   Apply ice to the injured area:   Put ice in a plastic bag.   Place a towel between your cast and the bag.   Leave the ice on for 20 minutes, 2-3 times a day.   If you have a plaster or fiberglass cast:   Do not try to scratch the skin under the cast with any objects. This can increase your risk of skin infection.   Check the skin around the cast every day. You   may put lotion on any red or sore areas.   Keep your cast dry and clean.   If you have a plaster splint:   Wear the splint as directed.   You may loosen the elastic around the splint if your toes become numb, tingle, or turn cold or blue.   Do not put pressure on any part of your cast or splint; it may break. Rest your cast only on a pillow the first 24 hours until it is fully hardened.   Your cast or splint can be protected during bathing with a plastic bag sealed to your skin with medical tape. Do not lower the cast or splint into water.   Take medicines as directed by your health care provider. Only take over-the-counter or prescription medicines for pain, discomfort, or fever as directed by your health care provider.   Do not drive a vehicle until  your health care provider specifically tells you it is safe to do so.   If your health care provider has given you a follow-up appointment, it is very important to keep that appointment. Not keeping the appointment could result in a chronic or permanent injury, pain, and disability. If you have any problem keeping the appointment, call the facility for assistance.  SEEK MEDICAL CARE IF:  You develop increased swelling or discomfort.  SEEK IMMEDIATE MEDICAL CARE IF:    Your cast gets damaged or breaks.   You have continued severe pain.   You develop new pain or swelling after the cast was put on.   Your skin or toenails below the injury turn blue or gray.   Your skin or toenails below the injury feel cold, numb, or have loss of sensitivity to touch.   There is a bad smell or pus draining from under the cast.  MAKE SURE YOU:    Understand these instructions.   Will watch your condition.   Will get help right away if you are not doing well or get worse.  Document Released: 02/07/2000 Document Revised: 02/14/2013 Document Reviewed: 09/08/2012  [employer] Patient Information 2015 [employer]. This information is not intended to replace advice given to you by your health care provider. Make sure you discuss any questions you have with your health care provider.

## 2013-12-14 NOTE — Progress Notes (Signed)
Per dr Jacqualyn Posey write for one pair of crutches DX: ankle fracture

## 2013-12-26 ENCOUNTER — Ambulatory Visit (INDEPENDENT_AMBULATORY_CARE_PROVIDER_SITE_OTHER): Payer: BC Managed Care – PPO

## 2013-12-26 ENCOUNTER — Ambulatory Visit (INDEPENDENT_AMBULATORY_CARE_PROVIDER_SITE_OTHER): Payer: BC Managed Care – PPO | Admitting: Podiatry

## 2013-12-26 VITALS — BP 101/53 | HR 88 | Resp 16

## 2013-12-26 DIAGNOSIS — S92301A Fracture of unspecified metatarsal bone(s), right foot, initial encounter for closed fracture: Secondary | ICD-10-CM

## 2013-12-26 DIAGNOSIS — S82401D Unspecified fracture of shaft of right fibula, subsequent encounter for closed fracture with routine healing: Secondary | ICD-10-CM

## 2013-12-26 NOTE — Progress Notes (Signed)
Patient ID: Carolyn Graham, female   DOB: Mar 29, 1955, 58 y.o.   MRN: [id number]  Subjective: Carolyn Graham, 58 year old female, returns the office today for follow-up evaluation of right ankle fracture.she states that the area has decreased in swelling as well as ecchymosis. She's been continuing to remain nonweightbearing. She has rented a knee scooter. She does that she put some weight down at times. Denies any acute changes since last appointment. No other complaints at this time.  Objective: AAO x3, NAD DP/PT pulses palpable bilaterally, CRT less than 3 seconds Protective sensation intact with Simms Weinstein monofilament Tenderness over the lateral aspect of the distal fibula on the right side. There is no tenderness along the course of the syndesmosis or along the medial malleolus or deltoid ligaments. There is mild edema overlying the lateral aspect of the ankle. Mild ecchymosis inferior lateral heel. No open lesions, erythema, increased warmth. No  Gross deformity identified. No calf pain with compression, swelling, warmth, erythema  Assessment: 58 year old female with right ankle fracture (Weber B)  Plan: -X-rays were obtained and reviewed with the patient. -Conservative versus surgical treatment discussed including alternatives, risks, competitions. -Recommend continued immobilization in a cam boot and remain nonweightbearing. -Follow-up in 2 weeks or sooner if any problems are to arise or any change in symptoms. Again discussed risks, occasions of immobilization including but not limited to DVT. Directed to the emergency room if any are to occur. Also discussed with her she'll likely be immobilized nonweightbearing for approximate 6 weeks, if not longer. Recommend follow-up with primary care physician to rule out other causes of fracture such as vitamin deficiencies.

## 2014-01-09 ENCOUNTER — Ambulatory Visit (INDEPENDENT_AMBULATORY_CARE_PROVIDER_SITE_OTHER): Payer: BC Managed Care – PPO

## 2014-01-09 ENCOUNTER — Ambulatory Visit (INDEPENDENT_AMBULATORY_CARE_PROVIDER_SITE_OTHER): Payer: BC Managed Care – PPO | Admitting: Podiatry

## 2014-01-09 VITALS — BP 131/82 | HR 84 | Resp 16

## 2014-01-09 DIAGNOSIS — S82401D Unspecified fracture of shaft of right fibula, subsequent encounter for closed fracture with routine healing: Secondary | ICD-10-CM

## 2014-01-09 DIAGNOSIS — S82409A Unspecified fracture of shaft of unspecified fibula, initial encounter for closed fracture: Secondary | ICD-10-CM | POA: Insufficient documentation

## 2014-01-09 NOTE — Progress Notes (Signed)
Patient ID: Carolyn Graham, female   DOB: 1955/04/27, 58 y.o.   MRN: [id number]  Subjective: 58 year old female returns the office they for follow-up evaluation of right ankle fracture. She states she has had decreased swelling as well as bruising. She states that she's been trying to remain nonweightbearing as much as possible using crutches and wearing the cam walker. She does put weight down on her foot at times. She denies any acute changes since last appointment. Denies any systemic complaints such as fevers, chills, nausea, vomiting. No other complaints at this time.  Objective: AAO x3, NAD DP/PT pulses palpable bilaterally, CRT less than 3 seconds Protective sensation intact with Simms Weinstein monofilament, vibratory sensation intact, Achilles tendon reflex intact Mild tenderness over the inferior aspect of the lateral malleolus of the distal fibula. There is no pain along the medial malleolus, deltoid ligaments, syndesmosis, proximal fibula. Mild edema overlying the lateral aspect of the ankle although decreased compared to prior. There is no open lesions, erythema, increase in warmth. There is no significant deformity noted. No calf pain, swelling, warmth, erythema No open lesions or pre-ulcerative lesions.  Assessment: 58 year old female with right ankle fracture (Weber B)  Plan: -X-rays were obtained and reviewed the patient. There is consolidation appearing across the fracture site. -Treatment options discussed both surgical and conservative including alternatives, risks, complications. -Continue with immobilization in a cam boot and nonweightbearing -Continue ice to the affected area -Follow-up in 2 weeks for repeat x-rays. In the meantime, call the office in the questions, concerns, change in symptoms. Again discussed the patient risks and complications of immobilization including but not limited to DVT/PE. Discussed to go to the emergency room if any are to occur.

## 2014-01-23 ENCOUNTER — Ambulatory Visit (INDEPENDENT_AMBULATORY_CARE_PROVIDER_SITE_OTHER): Payer: BC Managed Care – PPO | Admitting: Podiatry

## 2014-01-23 ENCOUNTER — Ambulatory Visit (INDEPENDENT_AMBULATORY_CARE_PROVIDER_SITE_OTHER): Payer: BC Managed Care – PPO

## 2014-01-23 VITALS — BP 114/71 | HR 75 | Resp 16

## 2014-01-23 DIAGNOSIS — S82401D Unspecified fracture of shaft of right fibula, subsequent encounter for closed fracture with routine healing: Secondary | ICD-10-CM

## 2014-01-23 DIAGNOSIS — S82401A Unspecified fracture of shaft of right fibula, initial encounter for closed fracture: Secondary | ICD-10-CM

## 2014-01-23 NOTE — Progress Notes (Signed)
Patient ID: Carolyn Graham, female   DOB: [birth year], 58 y.o.   MRN: [id number]  Subjective: 58 year old female returns the office they for follow-up evaluation of right fibular fracture.  She states that she has continue with the Cam Walker although she is started to weight-bear as tolerated.  She states that he has intermittent discomfort and swelling to the area. No acute changes since last appointment. No other complaints at this time. Denies any systemic complaints such as fevers, chills, nausea, vomiting.  Objective: AAO 3, NAD DP/PT pulses palpable bilaterally, CRT less than 3 seconds Protective sensation intact with Simms Weinstein monofilament Mild tenderness  Over the distal aspect of the right fibula with mild overlying edema. There is no overlying erythema or increase in warmth. There is no pain along the syndesmotic area,  Medial malleolus/deltoid ligaments, proximal leg.  No pain with range of motion of the ankle joint.  No pain with calf compression, swelling, warmth, erythema. No open lesions or pre-ulcerative lesions.   Assessment:  2 shell female 6 weeks status post right fibular fracture   Plan: -X-rays were obtained and reviewed with the patient. There continues to be radiolucent line within the fibula consistent with fracture line still visible. - Recommended continue to limit weightbearing and continue with the Cam Walker. - iIce and elevation - Follow-up in 2 weeks for repeat x-ray before the patient goes on vacation to [place]. Meantime, call the office with any questions, concerns, change in symptoms.

## 2014-02-06 ENCOUNTER — Ambulatory Visit (INDEPENDENT_AMBULATORY_CARE_PROVIDER_SITE_OTHER): Payer: BC Managed Care – PPO | Admitting: Podiatry

## 2014-02-06 ENCOUNTER — Ambulatory Visit (INDEPENDENT_AMBULATORY_CARE_PROVIDER_SITE_OTHER): Payer: BC Managed Care – PPO

## 2014-02-06 VITALS — BP 106/76 | HR 84 | Resp 16

## 2014-02-06 DIAGNOSIS — S82401D Unspecified fracture of shaft of right fibula, subsequent encounter for closed fracture with routine healing: Secondary | ICD-10-CM

## 2014-02-06 NOTE — Progress Notes (Signed)
Patient ID: Carolyn Graham, female   DOB: [birth year], 58 y.o.   MRN: [id number]  Subjective: 58 year old female returns the office they for follow-up evaluation status post right fibular fracture. She states that she is doing well and is feeling better. She states that she typically uses a rolling walker to not put weight on the foot although she did present today ambulating in a cam walker which she does intermittently. No other complaints at this time in no acute changes since last appointment. Denies any systemic complaints as fevers, chills, nausea, vomiting. Denies any chest pain, short of breath or any calf pain.  Objective: AAO x3, NAD DP/PT pulses palpable bilaterally, CRT less than 3 seconds Protective sensation intact with Simms Weinstein monofilament, vibratory sensation intact, Achilles tendon reflex intact There is no tenderness overlying the lateral aspect of the right ankle over the fibula. There is trace edema. No overlying erythema or increase in warmth. There is no pain with ankle joint range of motion. There is no other areas of pinpoint bony tenderness or pain with vibratory sensation. There is no ecchymosis present. There is no pain along the lateral ankle ligaments, medial ankle ligaments, syndesmosis. There is no tenderness along the medial ankle, proximal leg. MMT 4/5 on the right, 5/5 on the left. ROM WNL No pain with calf compression, swelling, warmth, erythema. No open lesions or pre-ulcerative lesions.  Assessment: 58 year old female 8 weeks status post right fibular fracture  Plan: -X-rays were obtained and reviewed with the patient. There does appear to be some consolidation although there is still a radiolucent line. -Treatment options were discussed including alternatives, risks, complications. -At this time as there is significant decrease in symptoms patient can start to slowly transition into weightbearing in a Cam Walker. Discussed with the patient if she has any  increase in symptoms to return to nonweightbearing. -Continue ice to the area and compression stocking as needed for swelling. -Follow-up in 3 weeks or sooner if any problems are to arise. Will obtain new x-rays at next appointment. In the meantime, encouraged the patient to call any questions, concerns, change in symptoms.

## 2014-02-27 ENCOUNTER — Ambulatory Visit (INDEPENDENT_AMBULATORY_CARE_PROVIDER_SITE_OTHER): Payer: BLUE CROSS/BLUE SHIELD

## 2014-02-27 ENCOUNTER — Ambulatory Visit (INDEPENDENT_AMBULATORY_CARE_PROVIDER_SITE_OTHER): Payer: BC Managed Care – PPO | Admitting: Podiatry

## 2014-02-27 VITALS — BP 121/76 | HR 83 | Resp 16

## 2014-02-27 DIAGNOSIS — S82401D Unspecified fracture of shaft of right fibula, subsequent encounter for closed fracture with routine healing: Secondary | ICD-10-CM

## 2014-02-27 NOTE — Progress Notes (Signed)
Patient ID: Carolyn Graham, female   DOB: 1956-01-08, 59 y.o.   MRN: [id number]  Subjective: 59 year old female returns the office for follow-up evaluation of right fibular fracture. She states that since last appointment she has been walking in a cam boot at all times without the aid of any assistive devices. She states that she has no pain to the area and her swelling has significantly improved. She denies any recent injury or trauma to the area.Denies any systemic complaints such as fevers, chills, nausea, vomiting. Denies any calf pain, shortness of breath, chest pain. No acute changes since last appointment, and no other complaints at this time.   Objective: AAO x3, NAD DP/PT pulses palpable bilaterally, CRT less than 3 seconds Protective sensation intact with Simms Weinstein monofilament, vibratory sensation intact, Achilles tendon reflex intact No tenderness to palpation overlying the lateral acid of the ankle at the site of the fracture and there is no pain with vibratory sensation over the area. There is no overlying edema, erythema, increase in warmth. Ankle joint range of motion is within normal limits however there is mild pain upon in range of motion of dorsiflexion plantarflexion. No other areas of pinpoint bony tenderness or pain with vibratory sensation to the foot/ankle. MMT 5/5, ROM WNL No pain with calf compression, swelling, warmth, erythema No open lesions or pre-ulcerative lesions.  Assessment: 59 year old female follow-up evaluation right fibular fracture, 11 weeks.  Plan: -X-rays were obtained and reviewed with the patient. There is evidence of increased consolidation across the fracture site.  -All treatment options discussed with the patient including all alternatives, risks, complications.  -At this time as there is no tenderness to palpation no pain vibratory sensation patient's been immobilized for 11 weeks will gradually start to transition into a regular sneaker with an  ankle brace which was dispensed to her. Discussed with her this should be a gradual transition and she has any increase in pain upon wearing shoes/brace that she should return to the [facility] and call the office. -Follow-up in 3 weeks for repeat x-rays or sooner should any problems or change in symptoms occur. In the meantime encouraged call the office request, concerns.

## 2014-03-20 ENCOUNTER — Ambulatory Visit (INDEPENDENT_AMBULATORY_CARE_PROVIDER_SITE_OTHER): Payer: BLUE CROSS/BLUE SHIELD

## 2014-03-20 ENCOUNTER — Ambulatory Visit (INDEPENDENT_AMBULATORY_CARE_PROVIDER_SITE_OTHER): Payer: BLUE CROSS/BLUE SHIELD | Admitting: Podiatry

## 2014-03-20 VITALS — BP 128/74 | HR 72 | Resp 16

## 2014-03-20 DIAGNOSIS — S82401D Unspecified fracture of shaft of right fibula, subsequent encounter for closed fracture with routine healing: Secondary | ICD-10-CM

## 2014-03-20 NOTE — Progress Notes (Signed)
Patient ID: Carolyn Graham, female   DOB: Jun 26, 1955, 59 y.o.   MRN: [id number]  Subjective: 59 year old female returns the office for follow-up evaluation of right fibular fracture. Since last appointment she has slowly transition into wearing a regular sneaker with an ankle brace. She states that over the last week she has been wearing the shoe and a brace without having to wear the CAM walker. She states that she is a gets some achiness to her ankle however overall her pain is controlled. She denies any acute changes since last appointment. No other complaints at this time.  Objective: AAO x3, NAD DP/PT pulses palpable bilaterally, CRT less than 3 seconds Protective sensation intact with Simms Weinstein monofilament, vibratory sensation intact, Achilles tendon reflex intact Currently, there is no tenderness to palpation around the distal aspect of the right fibula the site of the fracture there is no pain with vibratory sensation over the area. There is trace edema over the lateral aspect of the ankle. There is no other areas of tenderness to palpation or pain with vibratory sensation to other areas of the ankle or to the foot. There is no other areas of edema. There is no overlying erythema or increase in warmth. No tenderness to the contralateral extremity. No open lesions or pre-ulcer lesions identified. No pain with calf compression, swelling, warmth, erythema.  Assessment: 59 year old female with right fibular fracture, doing well  Plan: -X-rays were obtained and reviewed the patient. There does appear to be some increased consolidation across the fracture site. -Treatment options were discussed with the patient including alternatives, risks, complications. -At this time she can continue with a sneaker and ankle brace. If that she has any increase in pain she should return to the [facility] and call the office immediately. Continue ice and elevation as needed as well as the compression  anklet. -Follow-up in 4 weeks or sooner should any problems arise. At next appointment we'll obtain x-rays. In the meantime, encouraged to call the office in the questions, concerns, changes symptoms.

## 2014-04-17 ENCOUNTER — Encounter: Payer: Self-pay | Admitting: Podiatry

## 2014-04-17 ENCOUNTER — Ambulatory Visit (INDEPENDENT_AMBULATORY_CARE_PROVIDER_SITE_OTHER): Payer: BLUE CROSS/BLUE SHIELD | Admitting: Podiatry

## 2014-04-17 ENCOUNTER — Ambulatory Visit (INDEPENDENT_AMBULATORY_CARE_PROVIDER_SITE_OTHER): Payer: BLUE CROSS/BLUE SHIELD

## 2014-04-17 VITALS — BP 89/71 | HR 95 | Resp 18

## 2014-04-17 DIAGNOSIS — S82401D Unspecified fracture of shaft of right fibula, subsequent encounter for closed fracture with routine healing: Secondary | ICD-10-CM

## 2014-04-17 NOTE — Progress Notes (Signed)
Patient ID: DE LIBMAN, female   DOB: May 26, 1955, 59 y.o.   MRN: [id number]  Subjective: 59 year old female returns the office today for follow up evaluation of right fibular fracture. She states that since last appointment she has returned back into a regular shoe as she wears the brace the majority the time. She does state that she goes times without the brace. She states that overall she is doing well that she does intermittently have some discomfort achiness overlying the area. She states the majority the discomfort when she does have it is when going downstairs. She has been working on some exercises at home to help strengthen her ankle. She denies any acute changes since last appointment and no other complaints at this time.  Objective: AAO 3, NAD DP/PT pulses palpable, CRT less than 3 seconds Protective sensation intact with Simms Weinstein monofilament, vibratory sensation intact, Achilles tendon reflex intact. Currently, there is no tenderness to palpation overlying the distal aspect of the fibula at the site of the fracture on the right foot. There is no pain with ankle joint range of motion is no crepitation. There is no other areas of tenderness to bilateral lower extremities. There is trace overlying edema overlying the lateral aspect of the right ankle however there is no overlying erythema or increase in warmth. MMT 5/5, ROM WNL however there is mild discomfort upon in range of motion in both dorsiflexion plantarflexion of the ankle. No open lesions or pre-ulcerative lesions.  No pain with calf compression, swelling, warmth, erythema.  Assessment: 59 year old female with healing right fibular fracture  Plan: -X-rays were obtained and reviewed with the patient. -Treatment options were discussed the patient could alternatives, risks, complications. -At this time discussed the patient she can continue wearing a regular shoe as tolerated. She conservative transition away from ankle  braces tolerated. She does state that she's got some stiffness to her ankle and she would like physical therapy. A prescription was given for [facility] physical therapy. -Follow-up in one month for repeat x-rays and continued evaluation after PT. In the meantime, encouraged to call the office with any questions, concerns, change in symptoms.

## 2014-05-22 ENCOUNTER — Ambulatory Visit: Payer: BLUE CROSS/BLUE SHIELD | Admitting: Podiatry

## 2014-05-31 ENCOUNTER — Ambulatory Visit (INDEPENDENT_AMBULATORY_CARE_PROVIDER_SITE_OTHER): Payer: BLUE CROSS/BLUE SHIELD

## 2014-05-31 ENCOUNTER — Ambulatory Visit (INDEPENDENT_AMBULATORY_CARE_PROVIDER_SITE_OTHER): Payer: BLUE CROSS/BLUE SHIELD | Admitting: Podiatry

## 2014-05-31 VITALS — BP 120/71 | HR 92 | Resp 16

## 2014-05-31 DIAGNOSIS — S82401D Unspecified fracture of shaft of right fibula, subsequent encounter for closed fracture with routine healing: Secondary | ICD-10-CM | POA: Diagnosis not present

## 2014-05-31 DIAGNOSIS — M79671 Pain in right foot: Secondary | ICD-10-CM

## 2014-05-31 MED ORDER — DICLOFENAC SODIUM 75 MG PO TBEC: 75.0000 mg | DELAYED_RELEASE_TABLET | Freq: Two times a day (BID) | ORAL | Status: DC

## 2014-05-31 NOTE — Progress Notes (Signed)
Patient ID: Carolyn Graham, female   DOB: [birth year], 59 y.o.   MRN: [id number]  Subjective: Carolyn Graham returns the office today for follow up evaluation of right fibular fracture. She states that since last appointment she is completed physical therapy. He states the fraction the ankle does not hurt although she does have pain to her foot with ambulation. She has been continuing with regular shoe gear and orthotics which she had made previously. She denies any swelling or redness of the foot in denies any history of injury or trauma. She states the pain of the foot started after increasing her activity. She has no pain at rest however during ambulation she does have pain.She denies any acute changes since last appointment and no other complaints at this time.  Objective: AAO 3, NAD DP/PT pulses palpable, CRT less than 3 seconds Protective sensation intact with Simms Weinstein monofilament, vibratory sensation intact, Achilles tendon reflex intact. There is no tenderness to palpation overlying the distal aspect of the fibula at the site of the fracture on the right foot. No tenderness along the medial ankle, syndesmosis, proximal tib-fib. There is no pain with ankle joint range of motion is no crepitation. Currently, there is no tenderness to palpation or pain with vibratory sensation on any areas of the foot on the right side. Subjectively, overlying the dorsal medial midfoot there is discomfort with ambulation however there is noted discomfort upon palpation at rest. There is no overlying edema, erythema, increase in warmth. There is no other areas of tenderness to bilateral lower extremities. MMT 5/5, ROM WNL  No open lesions or pre-ulcerative lesions.  No pain with calf compression, swelling, warmth, erythema.  Assessment: 59 year old female with healing right fibular fracture; midfoot pain with ambulation  Plan: -X-rays were obtained and reviewed with the patient. -Treatment options were discussed  the patient could alternatives, risks, complications. Likely etiology of her symptoms were discussed.  -Patient's orthotics were evaluated and appears it she may need slightly more arch support. A pad was placed on the medial arch area of the orthotic to help see if this helps alleviate some of the symptoms. I also recommended her to try to wear the ankle brace and see if this helps take some the symptoms away to her foot area -I discussed possible steroid injection to the area however we'll hold off until next appointment. If symptoms continue will likely perform a steroid injection at that time. -Prescribed Voltaren. -Follow-up in 2-3 weeks or sooner should any problems arise. In the meantime, encouraged to call the office with any questions, concerns, change in symptoms.

## 2014-06-17 NOTE — Op Note (Signed)
PATIENT NAME:  Carolyn Graham, Carolyn Graham MR#:  [id number] DATE OF BIRTH:  [birth year]  DATE OF PROCEDURE:  05/20/2011  PREOPERATIVE DIAGNOSIS: Carpal tunnel syndrome, right wrist.   POSTOPERATIVE DIAGNOSIS: Carpal tunnel syndrome, right wrist.   PROCEDURE PERFORMED: Open carpal tunnel release.   SURGEON: Kathrene Alu., MD   ANESTHESIA: General.   HISTORY: The patient has a long history of carpal tunnel symptoms that were documented with nerve conduction studies. She was ultimately brought in for surgery due to her worsening condition.   PROCEDURE: The patient was taken to the Operating Room where satisfactory general anesthesia was achieved. A tourniquet was applied to right upper arm, and the right extremity was prepped and draped in the usual fashion for a procedure about the wrist.   The right upper extremity was then exsanguinated and the tourniquet inflated. About a 1-1/2 inch incision was made over the volar aspect of the distal half of the transverse carpal ligament. Dissection was carried down through the subcutaneous tissue onto the palmar fascia and the distal edge of the transverse carpal ligament. A hemostat was inserted under the ligament to protect the median nerve, and then I divided the ligament in its entirety. The distal half was divided with a knife and the proximal half with scissors. Inspection of the nerve after the division of the transverse carpal ligament revealed this was somewhat flattened in the area underneath the ligament. No mass was appreciated. There was no evidence for proliferative synovitis.   The tourniquet was released. It was up about 7 or 8 minutes. Bleeding was controlled with digital pressure. I did inject the subcutaneous tissue of the wound with about 6 mL of 0.5% Marcaine without epinephrine. The wound was irrigated with GU irrigant. The skin was closed with 4-0 nylon in vertical mattress fashion. Betadine was applied to the wound, followed by a sterile  dressing and a removable-type Velcro volar splint.    The patient was awakened and transferred to a stretcher bed. She was taken to the recovery room in satisfactory condition. Blood loss was negligible.   ____________________________ Kathrene Alu., MD hbk:cbb D: 05/20/2011 08:51:01 ET T: 05/20/2011 10:27:34 ET JOB#: [id number]  cc: Kathrene Alu., MD, <Dictator> Vilinda Flake, JR MD ELECTRONICALLY SIGNED 06/14/2011 21:23

## 2014-06-21 ENCOUNTER — Ambulatory Visit (INDEPENDENT_AMBULATORY_CARE_PROVIDER_SITE_OTHER): Payer: BLUE CROSS/BLUE SHIELD | Admitting: Podiatry

## 2014-06-21 ENCOUNTER — Ambulatory Visit (INDEPENDENT_AMBULATORY_CARE_PROVIDER_SITE_OTHER): Payer: BLUE CROSS/BLUE SHIELD

## 2014-06-21 ENCOUNTER — Encounter: Payer: Self-pay | Admitting: Podiatry

## 2014-06-21 VITALS — Ht 61.0 in | Wt 203.0 lb

## 2014-06-21 DIAGNOSIS — S82401D Unspecified fracture of shaft of right fibula, subsequent encounter for closed fracture with routine healing: Secondary | ICD-10-CM | POA: Diagnosis not present

## 2014-06-21 DIAGNOSIS — M19071 Primary osteoarthritis, right ankle and foot: Secondary | ICD-10-CM | POA: Diagnosis not present

## 2014-06-24 NOTE — Progress Notes (Signed)
Patient ID: Carolyn Graham, female   DOB: Sep 07, 1955, 59 y.o.   MRN: [id number]  Subjective: Carolyn Graham returns the office today for follow up evaluation of right fibular fracture and foot pain. She states that she does get intermittent swelling to the ankle she continues with some pain to the foot particularly with pressure. She states she has no pain to her foot at rest is only when putting weight down. Denies any swelling to the foot. No recent injury or trauma. She is continuing with a regular shoe and ankle other complaints at this time and no acute changes since last appointment.  Objective: AAO 3, NAD DP/PT pulses palpable, CRT less than 3 seconds Protective sensation intact with Simms Weinstein monofilament, vibratory sensation intact, Achilles tendon reflex intact. There is no tenderness to palpation overlying the distal aspect of the fibula at the site of the fracture on the right foot. No tenderness along the medial ankle, syndesmosis, proximal tib-fib. There is no pain with ankle joint range of motion is no crepitation. There is mild edema along the lateral aspect of the ankle without any associated erythema or increase in warmth. There continues to be some subjective tenderness on the dorsal medial midfoot. This time there is no tenderness to palpation and she states that this all really only painful when weightbearing and ambulation. There is no edema, erythema, increase in warmth. No other areas of tenderness to bilateral lower extremities. MMT 5/5, ROM WNL  No open lesions or pre-ulcerative lesions.  No pain with calf compression, swelling, warmth, erythema.  Assessment: 59 year old female with healing right fibular fracture; midfoot pain with ambulation  Plan: -X-rays were obtained and reviewed with the patient. -Treatment options were discussed the patient could alternatives, risks, complications. Likely etiology of her symptoms were discussed.  -Discussed possible steroid injection  into the dorsal midfoot. Patient with the proceed with this at this time. Under sterile conditions a total of 1 mL mixture of dexamethasone phosphate and 0.5% Marcaine plain was infiltrated in the area of maximal tenderness along the dorsal medial midfoot. Band-Aid was applied. Patient tolerated injection well any complications. Post injection care was discussed with the patient. -Orthotics were again modified. -Ice and elevation -Prescribed compound cream. -Continue ankle brace as needed. -Follow-up in 6 weeks or sooner if any problems are to arise. In the meantime call the office today questions, concerns, change in symptoms.

## 2014-11-05 DIAGNOSIS — M8580 Other specified disorders of bone density and structure, unspecified site: Secondary | ICD-10-CM | POA: Insufficient documentation

## 2014-11-15 ENCOUNTER — Other Ambulatory Visit: Payer: Self-pay | Admitting: Family Medicine

## 2014-11-15 DIAGNOSIS — Z1231 Encounter for screening mammogram for malignant neoplasm of breast: Secondary | ICD-10-CM

## 2014-11-22 ENCOUNTER — Ambulatory Visit: Payer: Self-pay

## 2014-11-27 ENCOUNTER — Other Ambulatory Visit: Payer: Self-pay | Admitting: Neurological Surgery

## 2014-11-27 DIAGNOSIS — M5412 Radiculopathy, cervical region: Secondary | ICD-10-CM

## 2014-11-27 DIAGNOSIS — M545 Low back pain: Secondary | ICD-10-CM

## 2014-12-03 ENCOUNTER — Ambulatory Visit
Admission: RE | Admit: 2014-12-03 | Discharge: 2014-12-03 | Disposition: A | Discharge status: Home / Self Care | Payer: PRIVATE HEALTH INSURANCE | Source: Ambulatory Visit | Attending: Family Medicine | Admitting: Family Medicine

## 2014-12-03 DIAGNOSIS — Z1231 Encounter for screening mammogram for malignant neoplasm of breast: Secondary | ICD-10-CM

## 2014-12-04 ENCOUNTER — Ambulatory Visit
Admission: RE | Admit: 2014-12-04 | Discharge: 2014-12-04 | Disposition: A | Discharge status: Home / Self Care | Payer: PRIVATE HEALTH INSURANCE | Source: Ambulatory Visit | Attending: Neurological Surgery | Admitting: Neurological Surgery

## 2014-12-04 DIAGNOSIS — M50222 Other cervical disc displacement at C5-C6 level: Secondary | ICD-10-CM | POA: Insufficient documentation

## 2014-12-04 DIAGNOSIS — M545 Low back pain: Secondary | ICD-10-CM

## 2014-12-04 DIAGNOSIS — Z981 Arthrodesis status: Secondary | ICD-10-CM | POA: Diagnosis not present

## 2014-12-04 DIAGNOSIS — M5412 Radiculopathy, cervical region: Secondary | ICD-10-CM | POA: Diagnosis present

## 2015-05-10 DIAGNOSIS — E785 Hyperlipidemia, unspecified: Secondary | ICD-10-CM | POA: Insufficient documentation

## 2015-06-24 ENCOUNTER — Encounter: Payer: Self-pay | Admitting: Internal Medicine

## 2015-11-07 ENCOUNTER — Encounter: Payer: Self-pay | Admitting: *Deleted

## 2015-11-08 DIAGNOSIS — G5602 Carpal tunnel syndrome, left upper limb: Secondary | ICD-10-CM | POA: Insufficient documentation

## 2015-11-12 ENCOUNTER — Encounter: Payer: Self-pay | Admitting: Internal Medicine

## 2015-11-12 NOTE — H&P (Signed)
Se scanned note.

## 2015-11-13 ENCOUNTER — Ambulatory Visit
Admission: RE | Admit: 2015-11-13 | Discharge: 2015-11-13 | Disposition: A | Discharge status: Home / Self Care | Payer: Managed Care, Other (non HMO) | Source: Ambulatory Visit | Attending: Ophthalmology | Admitting: Ophthalmology

## 2015-11-13 ENCOUNTER — Ambulatory Visit: Payer: Managed Care, Other (non HMO) | Admitting: Anesthesiology

## 2015-11-13 ENCOUNTER — Encounter: Payer: Self-pay | Admitting: *Deleted

## 2015-11-13 ENCOUNTER — Encounter
Admission: RE | Disposition: A | Discharge status: Home / Self Care | Payer: Self-pay | Source: Ambulatory Visit | Attending: Ophthalmology

## 2015-11-13 DIAGNOSIS — Z6837 Body mass index (BMI) 37.0-37.9, adult: Secondary | ICD-10-CM | POA: Insufficient documentation

## 2015-11-13 DIAGNOSIS — H2511 Age-related nuclear cataract, right eye: Secondary | ICD-10-CM | POA: Insufficient documentation

## 2015-11-13 DIAGNOSIS — E0590 Thyrotoxicosis, unspecified without thyrotoxic crisis or storm: Secondary | ICD-10-CM | POA: Insufficient documentation

## 2015-11-13 DIAGNOSIS — M797 Fibromyalgia: Secondary | ICD-10-CM | POA: Diagnosis not present

## 2015-11-13 DIAGNOSIS — M25473 Effusion, unspecified ankle: Secondary | ICD-10-CM | POA: Diagnosis not present

## 2015-11-13 DIAGNOSIS — M810 Age-related osteoporosis without current pathological fracture: Secondary | ICD-10-CM | POA: Insufficient documentation

## 2015-11-13 DIAGNOSIS — G4730 Sleep apnea, unspecified: Secondary | ICD-10-CM | POA: Insufficient documentation

## 2015-11-13 DIAGNOSIS — I10 Essential (primary) hypertension: Secondary | ICD-10-CM | POA: Insufficient documentation

## 2015-11-13 DIAGNOSIS — Z885 Allergy status to narcotic agent status: Secondary | ICD-10-CM | POA: Diagnosis not present

## 2015-11-13 DIAGNOSIS — Z981 Arthrodesis status: Secondary | ICD-10-CM | POA: Diagnosis not present

## 2015-11-13 HISTORY — DX: Dizziness and giddiness: R42

## 2015-11-13 HISTORY — DX: Thyrotoxicosis, unspecified without thyrotoxic crisis or storm: E05.90

## 2015-11-13 HISTORY — PX: CATARACT EXTRACTION W/PHACO: SHX586

## 2015-11-13 SURGERY — PHACOEMULSIFICATION, CATARACT, WITH IOL INSERTION
Anesthesia: Monitor Anesthesia Care | Site: Eye | Laterality: Right | Wound class: Clean

## 2015-11-13 MED ORDER — TETRACAINE HCL 0.5 % OP SOLN
OPHTHALMIC | Status: DC | PRN
  Administered 2015-11-13: 1 [drp] via OPHTHALMIC

## 2015-11-13 MED ORDER — MIDAZOLAM HCL 2 MG/2ML IJ SOLN
INTRAMUSCULAR | Status: DC | PRN
  Administered 2015-11-13: 1 mg via INTRAVENOUS

## 2015-11-13 MED ORDER — PHENYLEPHRINE HCL 10 % OP SOLN
1.0000 [drp] | OPHTHALMIC | Status: DC | PRN
  Administered 2015-11-13 (×4): 1 [drp] via OPHTHALMIC

## 2015-11-13 MED ORDER — CYCLOPENTOLATE HCL 2 % OP SOLN
OPHTHALMIC | Status: AC
  Administered 2015-11-13: 1 [drp] via OPHTHALMIC
  Filled 2015-11-13: qty 2

## 2015-11-13 MED ORDER — NA CHONDROIT SULF-NA HYALURON 40-17 MG/ML IO SOLN
INTRAOCULAR | Status: DC | PRN
  Administered 2015-11-13: 1 mL via INTRAOCULAR

## 2015-11-13 MED ORDER — CEFUROXIME OPHTHALMIC INJECTION 1 MG/0.1 ML
INJECTION | OPHTHALMIC | Status: DC | PRN
  Administered 2015-11-13: .1 mL via INTRACAMERAL

## 2015-11-13 MED ORDER — TETRACAINE HCL 0.5 % OP SOLN
OPHTHALMIC | Status: AC
  Filled 2015-11-13: qty 2

## 2015-11-13 MED ORDER — LIDOCAINE HCL (PF) 4 % IJ SOLN
INTRAMUSCULAR | Status: DC | PRN
  Administered 2015-11-13: 4 mL via OPHTHALMIC

## 2015-11-13 MED ORDER — PHENYLEPHRINE HCL 10 % OP SOLN
OPHTHALMIC | Status: AC
Start: 2015-11-13 — End: 2015-11-13
  Administered 2015-11-13: 1 [drp] via OPHTHALMIC
  Filled 2015-11-13: qty 5

## 2015-11-13 MED ORDER — LIDOCAINE HCL (PF) 4 % IJ SOLN
INTRAMUSCULAR | Status: AC
  Filled 2015-11-13: qty 5

## 2015-11-13 MED ORDER — CYCLOPENTOLATE HCL 2 % OP SOLN
1.0000 [drp] | OPHTHALMIC | Status: DC | PRN
  Administered 2015-11-13 (×4): 1 [drp] via OPHTHALMIC

## 2015-11-13 MED ORDER — ONDANSETRON HCL 4 MG/2ML IJ SOLN: 4.0000 mg | Freq: Once | INTRAMUSCULAR | Status: DC | PRN

## 2015-11-13 MED ORDER — MOXIFLOXACIN HCL 0.5 % OP SOLN
OPHTHALMIC | Status: DC | PRN
  Administered 2015-11-13: 1 [drp] via OPHTHALMIC

## 2015-11-13 MED ORDER — EPINEPHRINE HCL 1 MG/ML IJ SOLN
INTRAMUSCULAR | Status: DC | PRN
  Administered 2015-11-13: 1 mL via OPHTHALMIC

## 2015-11-13 MED ORDER — POVIDONE-IODINE 5 % OP SOLN
OPHTHALMIC | Status: AC
  Filled 2015-11-13: qty 30

## 2015-11-13 MED ORDER — MOXIFLOXACIN HCL 0.5 % OP SOLN
1.0000 [drp] | OPHTHALMIC | Status: DC | PRN
  Administered 2015-11-13 (×3): 1 [drp] via OPHTHALMIC

## 2015-11-13 MED ORDER — MOXIFLOXACIN HCL 0.5 % OP SOLN
OPHTHALMIC | Status: AC
Start: 2015-11-13 — End: 2015-11-13
  Administered 2015-11-13: 1 [drp] via OPHTHALMIC
  Filled 2015-11-13: qty 3

## 2015-11-13 MED ORDER — EPINEPHRINE HCL 1 MG/ML IJ SOLN
INTRAMUSCULAR | Status: AC
  Filled 2015-11-13: qty 2

## 2015-11-13 MED ORDER — BUPIVACAINE HCL (PF) 0.75 % IJ SOLN
INTRAMUSCULAR | Status: AC
  Filled 2015-11-13: qty 10

## 2015-11-13 MED ORDER — SODIUM CHLORIDE 0.9 % IV SOLN
INTRAVENOUS | Status: DC
  Administered 2015-11-13 (×2): via INTRAVENOUS

## 2015-11-13 MED ORDER — HYALURONIDASE HUMAN 150 UNIT/ML IJ SOLN
INTRAMUSCULAR | Status: AC
  Filled 2015-11-13: qty 1

## 2015-11-13 MED ORDER — FENTANYL CITRATE (PF) 100 MCG/2ML IJ SOLN: 25.0000 ug | INTRAMUSCULAR | Status: DC | PRN

## 2015-11-13 MED ORDER — CEFUROXIME OPHTHALMIC INJECTION 1 MG/0.1 ML
INJECTION | OPHTHALMIC | Status: AC
  Filled 2015-11-13: qty 0.2

## 2015-11-13 MED ORDER — CARBACHOL 0.01 % IO SOLN
INTRAOCULAR | Status: DC | PRN
  Administered 2015-11-13: .5 mL via INTRAOCULAR

## 2015-11-13 MED ORDER — NA CHONDROIT SULF-NA HYALURON 40-17 MG/ML IO SOLN
INTRAOCULAR | Status: AC
  Filled 2015-11-13: qty 1

## 2015-11-13 MED ORDER — LIDOCAINE HCL (PF) 4 % IJ SOLN
INTRAMUSCULAR | Status: DC | PRN
  Administered 2015-11-13: .5 mL via OPHTHALMIC

## 2015-11-13 MED ORDER — POVIDONE-IODINE 5 % OP SOLN
OPHTHALMIC | Status: DC | PRN
  Administered 2015-11-13: 1 via OPHTHALMIC

## 2015-11-13 MED ORDER — ALFENTANIL 500 MCG/ML IJ INJ
INJECTION | INTRAMUSCULAR | Status: DC | PRN
  Administered 2015-11-13: 500 ug via INTRAVENOUS

## 2015-11-13 SURGICAL SUPPLY — 30 items
CANNULA ANT/CHMB 27GA (MISCELLANEOUS) ×3 IMPLANT
CORD BIP STRL DISP 12FT (MISCELLANEOUS) ×3 IMPLANT
CUP MEDICINE 2OZ PLAST GRAD ST (MISCELLANEOUS) ×3 IMPLANT
DRAPE XRAY CASSETTE 23X24 (DRAPES) ×3 IMPLANT
ERASER HMR WETFIELD 18G (MISCELLANEOUS) ×3 IMPLANT
GLOVE BIO SURGEON STRL SZ8 (GLOVE) ×3 IMPLANT
GLOVE SURG LX 6.5 MICRO (GLOVE) ×2
GLOVE SURG LX 8.0 MICRO (GLOVE) ×2
GLOVE SURG LX STRL 6.5 MICRO (GLOVE) ×1 IMPLANT
GLOVE SURG LX STRL 8.0 MICRO (GLOVE) ×1 IMPLANT
GOWN STRL REUS W/ TWL LRG LVL3 (GOWN DISPOSABLE) ×1 IMPLANT
GOWN STRL REUS W/ TWL XL LVL3 (GOWN DISPOSABLE) ×1 IMPLANT
GOWN STRL REUS W/TWL LRG LVL3 (GOWN DISPOSABLE) ×2
GOWN STRL REUS W/TWL XL LVL3 (GOWN DISPOSABLE) ×2
LENS IOL ACRSF IQ ULTRA 15.0 (Intraocular Lens) ×1 IMPLANT
LENS IOL ACRYSOF IQ 15.0 (Intraocular Lens) ×3 IMPLANT
PACK CATARACT (MISCELLANEOUS) ×3 IMPLANT
PACK CATARACT DINGLEDEIN LX (MISCELLANEOUS) ×3 IMPLANT
PACK EYE AFTER SURG (MISCELLANEOUS) ×3 IMPLANT
SHLD EYE VISITEC  UNIV (MISCELLANEOUS) ×3 IMPLANT
SOL BSS BAG (MISCELLANEOUS) ×3
SOL PREP PVP 2OZ (MISCELLANEOUS) ×3
SOLUTION BSS BAG (MISCELLANEOUS) ×1 IMPLANT
SOLUTION PREP PVP 2OZ (MISCELLANEOUS) ×1 IMPLANT
SUT SILK 5-0 (SUTURE) ×3 IMPLANT
SYR 3ML LL SCALE MARK (SYRINGE) ×3 IMPLANT
SYR 5ML LL (SYRINGE) ×3 IMPLANT
SYR TB 1ML 27GX1/2 LL (SYRINGE) ×3 IMPLANT
WATER STERILE IRR 250ML POUR (IV SOLUTION) ×3 IMPLANT
WIPE NON LINTING 3.25X3.25 (MISCELLANEOUS) ×3 IMPLANT

## 2015-11-13 NOTE — Interval H&P Note (Signed)
History and Physical Interval Note:  11/13/2015 10:32 AM  Carolyn Graham  has presented today for surgery, with the diagnosis of CATARACT  The various methods of treatment have been discussed with the patient and family. After consideration of risks, benefits and other options for treatment, the patient has consented to  Procedure(s): CATARACT EXTRACTION PHACO AND INTRAOCULAR LENS PLACEMENT ([place]) (Right) as a surgical intervention .  The patient's history has been reviewed, patient examined, no change in status, stable for surgery.  I have reviewed the patient's chart and labs.  Questions were answered to the patient's satisfaction.     [name]

## 2015-11-13 NOTE — Transfer of Care (Signed)
Immediate Anesthesia Transfer of Care Note  Patient: Carolyn Graham  Procedure(s) Performed: Procedure(s) with comments: CATARACT EXTRACTION PHACO AND INTRAOCULAR LENS PLACEMENT ([facility]) (Right) - [place] 01:08 AP% 21.6 CDE 26.99 Fluid pack lot # [id number]  Patient Location: PACU  Anesthesia Type:MAC  Level of Consciousness: awake, alert  and oriented  Airway & Oxygen Therapy: Patient Spontanous Breathing  Post-op Assessment: Report given to RN and Post -op Vital signs reviewed and stable  Post vital signs: Reviewed and stable  Last Vitals:  Vitals:   11/13/15 0900  BP: 137/79  Pulse: 79  Resp: 20  Temp: 36.6 C    Last Pain:  Vitals:   11/13/15 0900  TempSrc: Oral         Complications: No apparent anesthesia complications

## 2015-11-13 NOTE — Op Note (Signed)
Date of Surgery: 11/13/2015 Date of Dictation: 11/13/2015 11:16 AM Pre-operative Diagnosis:  Nuclear Sclerotic Cataract right Eye Post-operative Diagnosis: same Procedure performed: Extra-capsular Cataract Extraction (ECCE) with placement of a posterior chamber intraocular lens (IOL) right Eye IOL:  Implant Name Type Inv. Item Serial No. Manufacturer Lot No. LRB No. Used  LENS IOL ACRYSOF IQ 15.0 - [id number] Intraocular Lens LENS IOL ACRYSOF IQ 15.0 [id number] ALCON   Right 1   Anesthesia: 2% Lidocaine and 4% Marcaine in a 50/50 mixture with 10 unites/ml of Hylenex given as a peribulbar Anesthesiologist: Anesthesiologist: Gijsbertus Lonia Mad, MD CRNA: Marsh Dolly, CRNA Complications: none Estimated Blood Loss: less than 1 ml  Description of procedure:  The patient was given anesthesia and sedation via intravenous access. The patient was then prepped and draped in the usual fashion. A 25-gauge needle was bent for initiating the capsulorhexis. A 5-0 silk suture was placed through the conjunctiva superior and inferiorly to serve as bridle sutures. Hemostasis was obtained at the superior limbus using an eraser cautery. A partial thickness groove was made at the anterior surgical limbus with a 64 Beaver blade and this was dissected anteriorly with an [employer]. The anterior chamber was entered at 10 o'clock with a 1.0 mm paracentesis knife and through the lamellar dissection with a 2.6 mm Alcon keratome. Epi-Shugarcaine 0.5 CC [9 cc BSS Plus (Alcon), 3 cc 4% preservative-free lidocaine (Hospira) and 4 cc 1:1000 preservative-free, bisulfite-free epinephrine] was injected into the anterior chamber via the paracentesis tract. Epi-Shugarcaine 0.5 CC [9 cc BSS Plus (Alcon), 3 cc 4% preservative-free lidocaine (Hospira) and 4 cc 1:1000 preservative-free, bisulfite-free epinephrine] was injected into the anterior chamber via the paracentesis tract. DiscoVisc was injected to replace the  aqueous and a continuous tear curvilinear capsulorhexis was performed using a bent 25-gauge needle.  Balance salt on a syringe was used to perform hydro-dissection and phacoemulsification was carried out using a divide and conquer technique. Procedure(s) with comments: CATARACT EXTRACTION PHACO AND INTRAOCULAR LENS PLACEMENT ([facility]) (Right) - [place] 01:08 AP% 21.6 CDE 26.99 Fluid pack lot # [id number]. Irrigation/aspiration was used to remove the residual cortex and the capsular bag was inflated with DiscoVisc. The intraocular lens was inserted into the capsular bag using a pre-loaded UltraSert Delivery System. Irrigation/aspiration was used to remove the residual DiscoVisc. The wound was inflated with balanced salt and checked for leaks. None were found. Miostat was injected via the paracentesis track and 0.1 ml of cefuroxime containing 1 mg of drug  was injected via the paracentesis track. The wound was checked for leaks again and none were found.   The bridal sutures were removed and two drops of Vigamox were placed on the eye. An eye shield was placed to protect the eye and the patient was discharged to the recovery area in good condition.   Justeen Hehr MD

## 2015-11-13 NOTE — Anesthesia Preprocedure Evaluation (Signed)
Anesthesia Evaluation  Patient identified by MRN, date of birth, ID band Patient awake    Reviewed: Allergy & Precautions, NPO status , Patient's Chart, lab work & pertinent test results  Airway Mallampati: II       Dental  (+) Teeth Intact   Pulmonary sleep apnea ,    breath sounds clear to auscultation       Cardiovascular Exercise Tolerance: Good hypertension, Pt. on home beta blockers  Rhythm:Regular     Neuro/Psych  Headaches,    GI/Hepatic Neg liver ROS, GERD  Medicated,  Endo/Other  Hyperthyroidism Morbid obesity  Renal/GU negative Renal ROS     Musculoskeletal   Abdominal (+) + obese,   Peds negative pediatric ROS (+)  Hematology   Anesthesia Other Findings   Reproductive/Obstetrics                             Anesthesia Physical Anesthesia Plan  ASA: III  Anesthesia Plan: MAC   Post-op Pain Management:    Induction: Intravenous  Airway Management Planned: Natural Airway and Nasal Cannula  Additional Equipment:   Intra-op Plan:   Post-operative Plan:   Informed Consent: I have reviewed the patients History and Physical, chart, labs and discussed the procedure including the risks, benefits and alternatives for the proposed anesthesia with the patient or authorized representative who has indicated his/her understanding and acceptance.     Plan Discussed with: CRNA  Anesthesia Plan Comments:         Anesthesia Quick Evaluation

## 2015-11-13 NOTE — Discharge Instructions (Signed)
Eye Surgery Discharge Instructions  Expect mild scratchy sensation or mild soreness. DO NOT RUB YOUR EYE!  The day of surgery:  Minimal physical activity, but bed rest is not required  No reading, computer work, or close hand work  No bending, lifting, or straining.  May watch TV  For 24 hours:  No driving, legal decisions, or alcoholic beverages  Safety precautions  Eat anything you prefer: It is better to start with liquids, then soup then solid foods.  _____ Eye patch should be worn until postoperative exam tomorrow.  ____ Solar shield eyeglasses should be worn for comfort in the sunlight/patch while sleeping  Resume all regular medications including aspirin or Coumadin if these were discontinued prior to surgery. You may shower, bathe, shave, or wash your hair. Tylenol may be taken for mild discomfort.  Call your doctor if you experience significant pain, nausea, or vomiting, fever > 101 or other signs of infection. [phone] or [phone] Specific instructions:  Follow-up Information    Dawnyel Leven, MD .   Specialty:  Ophthalmology Why:  September 21 at 10:05am Contact information: [address]   [place] [address] [phone]

## 2015-11-26 ENCOUNTER — Ambulatory Visit (AMBULATORY_SURGERY_CENTER): Payer: Self-pay

## 2015-11-26 VITALS — Ht 61.0 in | Wt 207.0 lb

## 2015-11-26 DIAGNOSIS — Z800 Family history of malignant neoplasm of digestive organs: Secondary | ICD-10-CM

## 2015-11-26 DIAGNOSIS — Z86010 Personal history of colonic polyps: Secondary | ICD-10-CM

## 2015-11-26 MED ORDER — NA SULFATE-K SULFATE-MG SULF 17.5-3.13-1.6 GM/177ML PO SOLN: ORAL | 0 refills | Status: DC

## 2015-11-26 NOTE — Progress Notes (Signed)
Per pt, no allergies to soy or egg products.Pt not taking any weight loss meds or using  O2 at home. 

## 2015-11-27 ENCOUNTER — Encounter: Payer: Self-pay | Admitting: Internal Medicine

## 2015-12-02 ENCOUNTER — Encounter: Payer: Self-pay | Admitting: *Deleted

## 2015-12-03 ENCOUNTER — Ambulatory Visit (AMBULATORY_SURGERY_CENTER): Payer: Managed Care, Other (non HMO) | Admitting: Internal Medicine

## 2015-12-03 ENCOUNTER — Encounter: Payer: Self-pay | Admitting: Internal Medicine

## 2015-12-03 VITALS — BP 137/83 | HR 69 | Temp 98.4°F | Resp 19 | Ht 61.0 in | Wt 207.0 lb

## 2015-12-03 DIAGNOSIS — D123 Benign neoplasm of transverse colon: Secondary | ICD-10-CM | POA: Diagnosis not present

## 2015-12-03 DIAGNOSIS — Z800 Family history of malignant neoplasm of digestive organs: Secondary | ICD-10-CM

## 2015-12-03 DIAGNOSIS — Z86010 Personal history of colonic polyps: Secondary | ICD-10-CM

## 2015-12-03 MED ORDER — SODIUM CHLORIDE 0.9 % IV SOLN: 500.0000 mL | INTRAVENOUS | Status: DC

## 2015-12-03 NOTE — Progress Notes (Signed)
No problems noted in the recovery room. maw 

## 2015-12-03 NOTE — Progress Notes (Signed)
Spontaneous respirations throughout. VSS. Resting comfortably. To PACU on room air. Report to  Annette RN.  

## 2015-12-03 NOTE — Op Note (Signed)
[facility] Patient Name: Carolyn Graham Procedure Date: 12/03/2015 9:16 AM MRN: [id number] Endoscopist: Docia Chuck. Henrene Pastor , MD Age: 60 Referring MD:  Date of Birth: Oct 01, 1955 Gender: Female Account #: [id number] Procedure:                Colonoscopy, with cold snare polypectomy X 1 Indications:              Surveillance: Personal history of adenomatous                            polyps on last colonoscopy 5 years ago, High risk                            colon cancer surveillance: Personal history of                            non-advanced adenoma. Index exam 2001 was negative.                            2009 with small adenoma. Mother and grandparent                            with history of colon cancer Medicines:                Monitored Anesthesia Care Procedure:                Pre-Anesthesia Assessment:                           - Prior to the procedure, a History and Physical                            was performed, and patient medications and                            allergies were reviewed. The patient's tolerance of                            previous anesthesia was also reviewed. The risks                            and benefits of the procedure and the sedation                            options and risks were discussed with the patient.                            All questions were answered, and informed consent                            was obtained. Prior Anticoagulants: The patient has                            taken no previous anticoagulant or antiplatelet  agents. ASA Grade Assessment: II - A patient with                            mild systemic disease. After reviewing the risks                            and benefits, the patient was deemed in                            satisfactory condition to undergo the procedure.                           After obtaining informed consent, the colonoscope                            was  passed under direct vision. Throughout the                            procedure, the patient's blood pressure, pulse, and                            oxygen saturations were monitored continuously. The                            Model CF-[id number]) scope was introduced                            through the anus and advanced to the the cecum,                            identified by appendiceal orifice and ileocecal                            valve. The ileocecal valve, appendiceal orifice,                            and rectum were photographed. The quality of the                            bowel preparation was excellent. The colonoscopy                            was performed without difficulty. The patient                            tolerated the procedure well. The bowel preparation                            used was SUPREP. Scope In: 9:27:44 AM Scope Out: 9:43:05 AM Scope Withdrawal Time: 0 hours 12 minutes 58 seconds  Total Procedure Duration: 0 hours 15 minutes 21 seconds  Findings:                 A 3 mm polyp was found in the transverse colon. The  polyp was removed with a cold snare. Resection and                            retrieval were complete.                           Internal hemorrhoids were found during retroflexion.                           The exam was otherwise without abnormality on                            direct and retroflexion views. Complications:            No immediate complications. Estimated blood loss:                            None. Estimated Blood Loss:     Estimated blood loss: none. Impression:               - One 3 mm polyp in the transverse colon, removed                            with a cold snare. Resected and retrieved.                           - Internal hemorrhoids.                           - The examination was otherwise normal on direct                            and retroflexion  views. Recommendation:           - Repeat colonoscopy in 5 years for surveillance.                           - Patient has a contact number available for                            emergencies. The signs and symptoms of potential                            delayed complications were discussed with the                            patient. Return to normal activities tomorrow.                            Written discharge instructions were provided to the                            patient.                           - Resume previous diet.                           -  Continue present medications.                           - Await pathology results. Docia Chuck. Henrene Pastor, MD 12/03/2015 9:47:35 AM This report has been signed electronically.

## 2015-12-03 NOTE — Patient Instructions (Signed)
YOU HAD AN ENDOSCOPIC PROCEDURE TODAY AT [facility]:   Refer to the procedure report that was given to you for any specific questions about what was found during the examination.  If the procedure report does not answer your questions, please call your gastroenterologist to clarify.  If you requested that your care partner not be given the details of your procedure findings, then the procedure report has been included in a sealed envelope for you to review at your convenience later.  YOU SHOULD EXPECT: Some feelings of bloating in the abdomen. Passage of more gas than usual.  Walking can help get rid of the air that was put into your GI tract during the procedure and reduce the bloating. If you had a lower endoscopy (such as a colonoscopy or flexible sigmoidoscopy) you may notice spotting of blood in your stool or on the toilet paper. If you underwent a bowel prep for your procedure, you may not have a normal bowel movement for a few days.  Please Note:  You might notice some irritation and congestion in your nose or some drainage.  This is from the oxygen used during your procedure.  There is no need for concern and it should clear up in a day or so.  SYMPTOMS TO REPORT IMMEDIATELY:   Following lower endoscopy (colonoscopy or flexible sigmoidoscopy):  Excessive amounts of blood in the stool  Significant tenderness or worsening of abdominal pains  Swelling of the abdomen that is new, acute  Fever of 100F or higher   Following upper endoscopy (EGD)  Vomiting of blood or coffee ground material  New chest pain or pain under the shoulder blades  Painful or persistently difficult swallowing  New shortness of breath  Fever of 100F or higher  Black, tarry-looking stools  For urgent or emergent issues, a gastroenterologist can be reached at any hour by calling [phone].   DIET:  We do recommend a small meal at first, but then you may proceed to your regular diet.  Drink  plenty of fluids but you should avoid alcoholic beverages for 24 hours.  ACTIVITY:  You should plan to take it easy for the rest of today and you should NOT DRIVE or use heavy machinery until tomorrow (because of the sedation medicines used during the test).    FOLLOW UP: Our staff will call the number listed on your records the next business day following your procedure to check on you and address any questions or concerns that you may have regarding the information given to you following your procedure. If we do not reach you, we will leave a message.  However, if you are feeling well and you are not experiencing any problems, there is no need to return our call.  We will assume that you have returned to your regular daily activities without incident.  If any biopsies were taken you will be contacted by phone or by letter within the next 1-3 weeks.  Please call us at [phone] if you have not heard about the biopsies in 3 weeks.    SIGNATURES/CONFIDENTIALITY: You and/or your care partner have signed paperwork which will be entered into your electronic medical record.  These signatures attest to the fact that that the information above on your After Visit Summary has been reviewed and is understood.  Full responsibility of the confidentiality of this discharge information lies with you and/or your care-partner.   Handouts were given to your care partner on polyps and  and hemorrhoids. You may resume your current medications today. Await biopsy results. Please call if any questions or concerns.   

## 2015-12-04 ENCOUNTER — Ambulatory Visit
Admission: RE | Admit: 2015-12-04 | Discharge: 2015-12-04 | Disposition: A | Discharge status: Home / Self Care | Payer: Managed Care, Other (non HMO) | Source: Ambulatory Visit | Attending: Ophthalmology | Admitting: Ophthalmology

## 2015-12-04 ENCOUNTER — Ambulatory Visit: Payer: Managed Care, Other (non HMO) | Admitting: Registered Nurse

## 2015-12-04 ENCOUNTER — Encounter
Admission: RE | Disposition: A | Discharge status: Home / Self Care | Payer: Self-pay | Source: Ambulatory Visit | Attending: Ophthalmology

## 2015-12-04 ENCOUNTER — Telehealth: Payer: Self-pay

## 2015-12-04 ENCOUNTER — Encounter: Payer: Self-pay | Admitting: *Deleted

## 2015-12-04 DIAGNOSIS — E0590 Thyrotoxicosis, unspecified without thyrotoxic crisis or storm: Secondary | ICD-10-CM | POA: Diagnosis not present

## 2015-12-04 DIAGNOSIS — I10 Essential (primary) hypertension: Secondary | ICD-10-CM | POA: Insufficient documentation

## 2015-12-04 DIAGNOSIS — M797 Fibromyalgia: Secondary | ICD-10-CM | POA: Insufficient documentation

## 2015-12-04 DIAGNOSIS — Z7982 Long term (current) use of aspirin: Secondary | ICD-10-CM | POA: Insufficient documentation

## 2015-12-04 DIAGNOSIS — Z8673 Personal history of transient ischemic attack (TIA), and cerebral infarction without residual deficits: Secondary | ICD-10-CM | POA: Diagnosis not present

## 2015-12-04 DIAGNOSIS — G43909 Migraine, unspecified, not intractable, without status migrainosus: Secondary | ICD-10-CM | POA: Diagnosis not present

## 2015-12-04 DIAGNOSIS — K760 Fatty (change of) liver, not elsewhere classified: Secondary | ICD-10-CM | POA: Diagnosis not present

## 2015-12-04 DIAGNOSIS — G4733 Obstructive sleep apnea (adult) (pediatric): Secondary | ICD-10-CM | POA: Insufficient documentation

## 2015-12-04 DIAGNOSIS — E7800 Pure hypercholesterolemia, unspecified: Secondary | ICD-10-CM | POA: Diagnosis not present

## 2015-12-04 DIAGNOSIS — Z79899 Other long term (current) drug therapy: Secondary | ICD-10-CM | POA: Diagnosis not present

## 2015-12-04 DIAGNOSIS — H2512 Age-related nuclear cataract, left eye: Secondary | ICD-10-CM | POA: Diagnosis not present

## 2015-12-04 DIAGNOSIS — M810 Age-related osteoporosis without current pathological fracture: Secondary | ICD-10-CM | POA: Insufficient documentation

## 2015-12-04 DIAGNOSIS — K219 Gastro-esophageal reflux disease without esophagitis: Secondary | ICD-10-CM | POA: Insufficient documentation

## 2015-12-04 DIAGNOSIS — M1990 Unspecified osteoarthritis, unspecified site: Secondary | ICD-10-CM | POA: Insufficient documentation

## 2015-12-04 DIAGNOSIS — G2581 Restless legs syndrome: Secondary | ICD-10-CM | POA: Insufficient documentation

## 2015-12-04 HISTORY — PX: CATARACT EXTRACTION W/PHACO: SHX586

## 2015-12-04 HISTORY — DX: Cerebral infarction, unspecified: I63.9

## 2015-12-04 SURGERY — PHACOEMULSIFICATION, CATARACT, WITH IOL INSERTION
Anesthesia: Regional | Site: Eye | Laterality: Left | Wound class: Clean

## 2015-12-04 MED ORDER — POVIDONE-IODINE 5 % OP SOLN
OPHTHALMIC | Status: AC
  Filled 2015-12-04: qty 30

## 2015-12-04 MED ORDER — CEFUROXIME OPHTHALMIC INJECTION 1 MG/0.1 ML
INJECTION | OPHTHALMIC | Status: DC | PRN
  Administered 2015-12-04: 0.1 mL via INTRACAMERAL

## 2015-12-04 MED ORDER — TETRACAINE HCL 0.5 % OP SOLN
OPHTHALMIC | Status: AC
  Filled 2015-12-04: qty 2

## 2015-12-04 MED ORDER — ALFENTANIL 500 MCG/ML IJ INJ
INJECTION | INTRAMUSCULAR | Status: DC | PRN
  Administered 2015-12-04: 500 ug via INTRAVENOUS

## 2015-12-04 MED ORDER — SODIUM CHLORIDE 0.9 % IV SOLN
INTRAVENOUS | Status: DC
  Administered 2015-12-04: 50 mL/h via INTRAVENOUS

## 2015-12-04 MED ORDER — BUPIVACAINE HCL (PF) 0.75 % IJ SOLN
INTRAMUSCULAR | Status: AC
  Filled 2015-12-04: qty 10

## 2015-12-04 MED ORDER — PROPARACAINE HCL 0.5 % OP SOLN
OPHTHALMIC | Status: AC
  Filled 2015-12-04: qty 15

## 2015-12-04 MED ORDER — CYCLOPENTOLATE HCL 2 % OP SOLN
OPHTHALMIC | Status: AC
  Administered 2015-12-04: 1 [drp] via OPHTHALMIC
  Filled 2015-12-04: qty 2

## 2015-12-04 MED ORDER — CEFUROXIME OPHTHALMIC INJECTION 1 MG/0.1 ML
INJECTION | OPHTHALMIC | Status: AC
  Filled 2015-12-04: qty 0.1

## 2015-12-04 MED ORDER — HYALURONIDASE HUMAN 150 UNIT/ML IJ SOLN
INTRAMUSCULAR | Status: AC
  Filled 2015-12-04: qty 1

## 2015-12-04 MED ORDER — ONDANSETRON HCL 4 MG/2ML IJ SOLN: 4.0000 mg | Freq: Once | INTRAMUSCULAR | Status: DC | PRN

## 2015-12-04 MED ORDER — BUPIVACAINE HCL (PF) 0.75 % IJ SOLN
INTRAMUSCULAR | Status: DC | PRN
  Administered 2015-12-04: 9 mL via OPHTHALMIC

## 2015-12-04 MED ORDER — CYCLOPENTOLATE HCL 2 % OP SOLN
1.0000 [drp] | OPHTHALMIC | Status: AC
  Administered 2015-12-04 (×2): 1 [drp] via OPHTHALMIC

## 2015-12-04 MED ORDER — ONDANSETRON HCL 4 MG/2ML IJ SOLN
INTRAMUSCULAR | Status: DC | PRN
  Administered 2015-12-04: 4 mg via INTRAVENOUS

## 2015-12-04 MED ORDER — LIDOCAINE HCL (PF) 4 % IJ SOLN
INTRAMUSCULAR | Status: AC
  Filled 2015-12-04: qty 5

## 2015-12-04 MED ORDER — MOXIFLOXACIN HCL 0.5 % OP SOLN
1.0000 [drp] | OPHTHALMIC | Status: AC
  Administered 2015-12-04 (×2): 1 [drp] via OPHTHALMIC

## 2015-12-04 MED ORDER — MIDAZOLAM HCL 2 MG/2ML IJ SOLN
INTRAMUSCULAR | Status: DC | PRN
  Administered 2015-12-04: 1 mg via INTRAVENOUS

## 2015-12-04 MED ORDER — PROPARACAINE HCL 0.5 % OP SOLN
OPHTHALMIC | Status: DC | PRN
  Administered 2015-12-04: 1 [drp] via OPHTHALMIC

## 2015-12-04 MED ORDER — EPINEPHRINE PF 1 MG/ML IJ SOLN
INTRAMUSCULAR | Status: AC
  Filled 2015-12-04: qty 1

## 2015-12-04 MED ORDER — CYCLOPENTOLATE HCL 2 % OP SOLN
1.0000 [drp] | OPHTHALMIC | Status: AC
  Administered 2015-12-04: 1 [drp] via OPHTHALMIC

## 2015-12-04 MED ORDER — TETRACAINE HCL 0.5 % OP SOLN
OPHTHALMIC | Status: DC | PRN
  Administered 2015-12-04: 1 [drp] via OPHTHALMIC

## 2015-12-04 MED ORDER — PHENYLEPHRINE HCL 10 % OP SOLN
OPHTHALMIC | Status: AC
  Administered 2015-12-04: 1 [drp] via OPHTHALMIC
  Filled 2015-12-04: qty 5

## 2015-12-04 MED ORDER — FENTANYL CITRATE (PF) 100 MCG/2ML IJ SOLN: 25.0000 ug | INTRAMUSCULAR | Status: DC | PRN

## 2015-12-04 MED ORDER — NA CHONDROIT SULF-NA HYALURON 40-17 MG/ML IO SOLN
INTRAOCULAR | Status: DC | PRN
  Administered 2015-12-04: 1 mL via INTRAOCULAR

## 2015-12-04 MED ORDER — MOXIFLOXACIN HCL 0.5 % OP SOLN
OPHTHALMIC | Status: DC | PRN
  Administered 2015-12-04: 1 [drp] via OPHTHALMIC

## 2015-12-04 MED ORDER — EPINEPHRINE PF 1 MG/ML IJ SOLN
INTRAOCULAR | Status: DC | PRN
  Administered 2015-12-04: 1 mL via OPHTHALMIC

## 2015-12-04 MED ORDER — MOXIFLOXACIN HCL 0.5 % OP SOLN
1.0000 [drp] | OPHTHALMIC | Status: AC
  Administered 2015-12-04: 1 [drp] via OPHTHALMIC

## 2015-12-04 MED ORDER — MOXIFLOXACIN HCL 0.5 % OP SOLN
OPHTHALMIC | Status: AC
  Administered 2015-12-04: 1 [drp] via OPHTHALMIC
  Filled 2015-12-04: qty 3

## 2015-12-04 MED ORDER — PHENYLEPHRINE HCL 10 % OP SOLN
1.0000 [drp] | OPHTHALMIC | Status: AC
  Administered 2015-12-04: 1 [drp] via OPHTHALMIC

## 2015-12-04 MED ORDER — POVIDONE-IODINE 5 % OP SOLN
OPHTHALMIC | Status: DC | PRN
  Administered 2015-12-04: 1 via OPHTHALMIC

## 2015-12-04 MED ORDER — LIDOCAINE HCL (PF) 4 % IJ SOLN
INTRAOCULAR | Status: DC | PRN
  Administered 2015-12-04: 4 mL via OPHTHALMIC

## 2015-12-04 MED ORDER — CARBACHOL 0.01 % IO SOLN
INTRAOCULAR | Status: DC | PRN
  Administered 2015-12-04: 0.5 mL via INTRAOCULAR

## 2015-12-04 MED ORDER — PHENYLEPHRINE HCL 10 % OP SOLN
1.0000 [drp] | OPHTHALMIC | Status: AC
  Administered 2015-12-04 (×2): 1 [drp] via OPHTHALMIC

## 2015-12-04 MED ORDER — NA CHONDROIT SULF-NA HYALURON 40-17 MG/ML IO SOLN
INTRAOCULAR | Status: AC
  Filled 2015-12-04: qty 1

## 2015-12-04 SURGICAL SUPPLY — 31 items
CANNULA ANT/CHMB 27GA (MISCELLANEOUS) ×3 IMPLANT
CORD BIP STRL DISP 12FT (MISCELLANEOUS) ×3 IMPLANT
CUP MEDICINE 2OZ PLAST GRAD ST (MISCELLANEOUS) ×3 IMPLANT
DRAPE XRAY CASSETTE 23X24 (DRAPES) ×3 IMPLANT
ERASER HMR WETFIELD 18G (MISCELLANEOUS) ×3 IMPLANT
GLOVE BIO SURGEON STRL SZ8 (GLOVE) ×3 IMPLANT
GLOVE SURG LX 6.5 MICRO (GLOVE) ×2
GLOVE SURG LX 8.0 MICRO (GLOVE) ×2
GLOVE SURG LX STRL 6.5 MICRO (GLOVE) ×1 IMPLANT
GLOVE SURG LX STRL 8.0 MICRO (GLOVE) ×1 IMPLANT
GOWN STRL REUS W/ TWL LRG LVL3 (GOWN DISPOSABLE) ×1 IMPLANT
GOWN STRL REUS W/ TWL XL LVL3 (GOWN DISPOSABLE) ×1 IMPLANT
GOWN STRL REUS W/TWL LRG LVL3 (GOWN DISPOSABLE) ×2
GOWN STRL REUS W/TWL XL LVL3 (GOWN DISPOSABLE) ×2
LENS IOL ACRSF IQ TRC 5 14.0 (Intraocular Lens) ×1 IMPLANT
LENS IOL ACRYSOF IQ TORIC 14.0 (Intraocular Lens) ×2 IMPLANT
LENS IOL IQ TORIC 5 14.0 (Intraocular Lens) ×1 IMPLANT
PACK CATARACT (MISCELLANEOUS) ×3 IMPLANT
PACK CATARACT DINGLEDEIN LX (MISCELLANEOUS) ×3 IMPLANT
PACK EYE AFTER SURG (MISCELLANEOUS) ×3 IMPLANT
SHLD EYE VISITEC  UNIV (MISCELLANEOUS) ×3 IMPLANT
SOL BSS BAG (MISCELLANEOUS) ×3
SOL PREP PVP 2OZ (MISCELLANEOUS) ×3
SOLUTION BSS BAG (MISCELLANEOUS) ×1 IMPLANT
SOLUTION PREP PVP 2OZ (MISCELLANEOUS) ×1 IMPLANT
SUT SILK 5-0 (SUTURE) ×3 IMPLANT
SYR 3ML LL SCALE MARK (SYRINGE) ×3 IMPLANT
SYR 5ML LL (SYRINGE) ×3 IMPLANT
SYR TB 1ML 27GX1/2 LL (SYRINGE) ×3 IMPLANT
WATER STERILE IRR 250ML POUR (IV SOLUTION) ×3 IMPLANT
WIPE NON LINTING 3.25X3.25 (MISCELLANEOUS) ×3 IMPLANT

## 2015-12-04 NOTE — Telephone Encounter (Signed)
  Follow up Call-  Call back number 12/03/2015  Post procedure Call Back phone  # [phone]  Permission to leave phone message Yes  Some recent data might be hidden     Patient questions:  Do you have a fever, pain , or abdominal swelling? No. Pain Score  0 *  Have you tolerated food without any problems? Yes.    Have you been able to return to your normal activities? Yes.    Do you have any questions about your discharge instructions: Diet   No. Medications  No. Follow up visit  No.  Do you have questions or concerns about your Care? No.  Actions: * If pain score is 4 or above: No action needed, pain <4.  No problems per the pt. maw

## 2015-12-04 NOTE — Transfer of Care (Signed)
Immediate Anesthesia Transfer of Care Note  Patient: Carolyn Graham  Procedure(s) Performed: Procedure(s) with comments: CATARACT EXTRACTION PHACO AND INTRAOCULAR LENS PLACEMENT (IOC) (Left) - [place] 59.2 AP% 24.4 CDE 29.2 Fluid Pack lot # [id number]  Patient Location: PACU  Anesthesia Type:General  Level of Consciousness: sedated  Airway & Oxygen Therapy: Patient Spontanous Breathing and Patient connected to face mask oxygen  Post-op Assessment: Report given to RN and Post -op Vital signs reviewed and stable  Post vital signs: Reviewed and stable  Last Vitals:  Vitals:   12/04/15 1037 12/04/15 1040  BP: (!) 150/87 (!) 150/87  Pulse: 65 69  Resp: 16 12  Temp: 36.6 C [id number] C    Complications: No apparent anesthesia complications

## 2015-12-04 NOTE — Interval H&P Note (Signed)
History and Physical Interval Note:  12/04/2015 9:48 AM  Carolyn Graham  has presented today for surgery, with the diagnosis of NUCLEAR SCLEROTIC CATARACT LEFT EYE  The various methods of treatment have been discussed with the patient and family. After consideration of risks, benefits and other options for treatment, the patient has consented to  Procedure(s) with comments: CATARACT EXTRACTION PHACO AND INTRAOCULAR LENS PLACEMENT (IOC) (Left) - [place] AP% CDE Fluid Pack lot # [id number] as a surgical intervention .  The patient's history has been reviewed, patient examined, no change in status, stable for surgery.  I have reviewed the patient's chart and labs.  Questions were answered to the patient's satisfaction.     Zakeria Kulzer

## 2015-12-04 NOTE — Anesthesia Procedure Notes (Signed)
Procedure Name: MAC Date/Time: 12/04/2015 9:58 AM Performed by: Doreen Salvage Pre-anesthesia Checklist: Patient identified, Emergency Drugs available, Suction available and Patient being monitored Patient Re-evaluated:Patient Re-evaluated prior to inductionOxygen Delivery Method: Nasal cannula

## 2015-12-04 NOTE — Op Note (Signed)
Date of Surgery: 12/04/2015 Date of Dictation: 12/04/2015 10:40 AM Pre-operative Diagnosis:Nuclear Sclerotic Cataract left Eye Post-operative Diagnosis: same Procedure performed: Extra-capsular Cataract Extraction (ECCE) with placement of a posterior chamber intraocular lens (IOL) left Eye IOL:  Implant Name Type Inv. Item Serial No. Manufacturer Lot No. LRB No. Used  LENS IOL TORIC 14.0 - [id number] Intraocular Lens LENS IOL TORIC 14.0 [id number] ALCON   Left 1   Anesthesia: 2% Lidocaine and 4% Marcaine in a 50/50 mixture with 10 unites/ml of Hylenex given as a peribulbar Anesthesiologist: Anesthesiologist: Molli Barrows, MD CRNA: Doreen Salvage, CRNA Complications: none Estimated Blood Loss: less than 1 ml  Description of procedure:  The patient sat upright and the eyes were anesthetized with topical proparacaine. With the patient fixing at a distant target the 3 o'clock and 9 o'clock positions at the limbus were marked with an sterile indelible surgical marking pen.  The patient was given anesthesia and sedation via intravenous access. The patient was then prepped and draped in the usual fashion. A 25-gauge needle was bent to use for starting the capsulorhexis. A 5-0 silk suture was placed through the conjunctiva superior and inferiorly to serve as bridle sutures.   The [place] system was engaged and registration was performed. The positions of the incisions and the steep axis of the astigmatism were identified by the [place] system and marked with an indelible pen. The Verion heads up display was turned off.  Hemostasis was obtained at the the position of the main incision using an eraser cautery. A partial thickness groove was made at the at that location with a 64 [place] blade and this was dissected anteriorly with an [employer]. The anterior chamber was entered at 10 o'clock with a 1.0 mm paracentesis knife and through the lamellar dissection with a 2.6 mm Alcon keratome.  Epi-Shugarcaine 0.5 CC [9 cc BSS Plus (Alcon), 3 cc 4% preservative-free lidocaine (Hospira) and 4 cc 1:1000 preservative-free, bisulfite-free epinephrine] was injected into the anterior chamber via the paracentesis tract. DiscoVisc was injected to replace the aqueous and a continuous tear curvilinear capsulorhexis was performed using a bent 25-gauge needle.  Balance salt on a syringe was used to perform hydro-dissection and phacoemulsification was carried out using a divide and conquer technique. Procedure(s) with comments: CATARACT EXTRACTION PHACO AND INTRAOCULAR LENS PLACEMENT (IOC) (Left) - [place] 59.2 AP% 24.4 CDE 29.2 Fluid Pack lot # [id number]. Irrigation/aspiration was used to remove the residual cortex and the capsular bag was inflated with DiscoVisc. The intraocular lens was inserted into the capsular bag using a Monarch Delivery System cartridge. The Verion heads up display was turned on and the lens was rotated so that the marks on the base of the haptics were aligned with the steep axis of astigmatism as identified by the [place] unit.   Irrigation/aspiration was used to remove the residual DiscoVisc. The I/A hand piece was pressed down on top of the lens to prevent rotation. The wound was inflated with balanced salt and checked for leaks. None were found. The position of the Toric lens was reconfirmed using the [place] unit. Miostat was injected via the paracentesis track and 0.1 ml of cefuroxime containing 1 mg of drug was injected via the paracentesis track. The wound was checked for leaks again and none were found.   The bridal sutures were removed and two drops of Vigamox were placed on the eye. An eye shield was placed to protect the eye and the patient was discharged to the recovery area  in good condition.   Melannie Metzner MD @T @

## 2015-12-04 NOTE — H&P (Signed)
See scanned note.

## 2015-12-04 NOTE — Discharge Instructions (Signed)

## 2015-12-05 ENCOUNTER — Encounter: Payer: Self-pay | Admitting: Ophthalmology

## 2015-12-06 ENCOUNTER — Encounter: Payer: Self-pay | Admitting: Internal Medicine

## 2015-12-09 ENCOUNTER — Encounter
Admission: RE | Admit: 2015-12-09 | Discharge: 2015-12-09 | Disposition: A | Discharge status: Home / Self Care | Payer: Managed Care, Other (non HMO) | Source: Ambulatory Visit | Attending: Unknown Physician Specialty | Admitting: Unknown Physician Specialty

## 2015-12-09 DIAGNOSIS — Z01818 Encounter for other preprocedural examination: Secondary | ICD-10-CM | POA: Diagnosis not present

## 2015-12-09 DIAGNOSIS — Z01812 Encounter for preprocedural laboratory examination: Secondary | ICD-10-CM | POA: Insufficient documentation

## 2015-12-09 DIAGNOSIS — G5602 Carpal tunnel syndrome, left upper limb: Secondary | ICD-10-CM | POA: Insufficient documentation

## 2015-12-09 DIAGNOSIS — R9431 Abnormal electrocardiogram [ECG] [EKG]: Secondary | ICD-10-CM | POA: Diagnosis not present

## 2015-12-09 DIAGNOSIS — I10 Essential (primary) hypertension: Secondary | ICD-10-CM | POA: Insufficient documentation

## 2015-12-09 LAB — POTASSIUM: Potassium: 3.7 mmol/L (ref 3.5–5.1)

## 2015-12-09 NOTE — Patient Instructions (Signed)
Your procedure is scheduled on: December 18, 2015  Su procedimiento est programado para: Report to second floor of the [facility]. Presntese a: To find out your arrival time please call [phone] between 1PM - 3PM on  Tuesday December 16, 2012. Para saber su hora de llegada por favor llame al [phone] entre la 1PM - 3PM el da:  Remember: Instructions that are not followed completely may result in serious medical risk, up to and including death, or upon the discretion of your surgeon and anesthesiologist your surgery may need to be rescheduled.  Recuerde: Las instrucciones que no se siguen completamente Heritage manager en un riesgo de salud grave, incluyendo hasta la [place] o a discrecin de su cirujano y Environmental health practitioner, su ciruga se puede posponer.   __X__ 1. Do not eat food or drink liquids after midnight. No gum chewing or hard candies.  No coma alimentos ni tome lquidos despus de la medianoche.  No mastique chicle ni caramelos  duros.     _X___ 2. No alcohol for 24 hours before or after surgery.    No tome alcohol durante las 24 horas antes ni despus de la [place].   __X__ 3. Bring all medications with you on the day of surgery if instructed.  BRING ANY NEW MEDICATIONS ONLY    Lleve todos los medicamentos con usted el da de su ciruga si se le ha indicado as.   __X__ 4. Notify your doctor if there is any change in your medical condition (cold, fever,                             infections).    Informe a su mdico si hay algn cambio en su condicin mdica (resfriado, fiebre, infecciones).   Do not wear jewelry, make-up, hairpins, clips or nail polish.  No use joyas, maquillajes, pinzas/ganchos para el cabello ni esmalte de uas.  Do not wear lotions, powders, or perfumes. You may wear deodorant.  No use lociones, polvos o perfumes.  Puede usar desodorante.    Do not shave 48 hours prior to surgery. Men may shave face and neck.  No se afeite 48 horas antes de la  [place].  Los hombres pueden [employer] cara y el cuello.   Do not bring valuables to the hospital.   No lleve objetos [facility] is not responsible for any belongings or valuables.  [facility] no se hace responsable de ningn tipo de pertenencias u objetos de Geographical information systems officer.               Contacts, dentures or bridgework may not be worn into surgery.  Los lentes de [place], las dentaduras postizas o puentes no se pueden usar en la [place].  Leave your suitcase in the car. After surgery it may be brought to your room.  Deje su maleta en el auto.  Despus de la ciruga podr traerla a su habitacin.  For patients admitted to the hospital, discharge time is determined by your treatment team.  Para los pacientes que sean ingresados al hospital, el tiempo en el cual se le dar de alta es determinado por su                equipo de [place].   Patients discharged the day of surgery will not be allowed to drive home. A los pacientes que se les da de alta el mismo da de la ciruga no se  les permitir conducir a casa.   Please read over the following fact sheets that you were given: Por favor [place] informacin que le dieron:   CHG INSTRUCTIONS   ____ Take these medicines the morning of surgery with A SIP OF WATER:          [employer] medicinas la maana de la ciruga con UN SORBO DE AGUA:  1. Omeprazole dose the night before and the morning of surgery  2.   3.   4.       5.  6.  ____ Fleet Enema (as directed)          Enema de Fleet (segn lo indicado)    __x__ Use CHG Soap as directed          Utilice el jabn de CHG segn lo indicado  ____ Use inhalers on the day of surgery          Use los inhaladores el da de la ciruga  ____ Stop metformin 2 days prior to surgery          Deje de tomar el metformin 2 das antes de la ciruga    ____ Take 1/2 of usual insulin dose the night before surgery and none on the morning of surgery            Tome la mitad de la dosis habitual de insulina la noche antes de la [place] y no tome nada en la maana de la             ciruga  __x__ Stop Coumadin/Plavix/aspirin until after surgery           Deje de tomar el Coumadin/Plavix/aspirina el da:  _x___ Stop Anti-inflammatories on   Ibuprofen, aleve,motrin, advil, naproxyn, meloxicam        ____ Stop supplements until after surgery            Deje de tomar suplementos hasta despus de la ciruga  __x_ Bring C-Pap to the hospital          [facility] al hospital

## 2015-12-09 NOTE — Anesthesia Preprocedure Evaluation (Signed)
Anesthesia Evaluation  Patient identified by MRN, date of birth, ID band Patient awake    Reviewed: Allergy & Precautions, H&P , NPO status , Patient's Chart, lab work & pertinent test results, reviewed documented beta blocker date and time   Airway Mallampati: II   Neck ROM: full    Dental  (+) Poor Dentition   Pulmonary neg pulmonary ROS, sleep apnea ,    Pulmonary exam normal        Cardiovascular hypertension, negative cardio ROS Normal cardiovascular exam Rate:Normal     Neuro/Psych  Headaches, CVA negative neurological ROS  negative psych ROS   GI/Hepatic negative GI ROS, Neg liver ROS, GERD  ,  Endo/Other  negative endocrine ROSHyperthyroidism   Renal/GU negative Renal ROS  negative genitourinary   Musculoskeletal   Abdominal   Peds  Hematology negative hematology ROS (+)   Anesthesia Other Findings Past Medical History: No date: Arthritis 11/2013: Broken ankle     Comment: right ankle No date: Cataract     Comment: Bil/ surgery on right eye No date: Fatty liver 2005: Fibromyalgia No date: GERD (gastroesophageal reflux disease)     Comment: uses prilosec No date: Headache(784.0)     Comment: not as often as before No date: Hypercholesteremia No date: Hypertension No date: Hyperthyroidism No date: OSA (obstructive sleep apnea)     Comment: CPAP No date: Post-operative nausea and vomiting No date: Restless leg syndrome No date: Schatzki's ring 2015: Stroke ([place])     Comment: TIA.  unable to determine if this is true. No date: Thyroiditis No date: Vertigo Past Surgical History: 11/13/2015: CATARACT EXTRACTION W/PHACO Right     Comment: Procedure: CATARACT EXTRACTION PHACO AND               INTRAOCULAR LENS PLACEMENT (IOC);  Surgeon:               Estill Cotta, MD;  Location: [facility];                Service: Ophthalmology;  Laterality: Right;  [place]              01:08AP% 21.6CDE 26.99Fluid  pack lot # [id number] 12/04/2015: CATARACT EXTRACTION W/PHACO Left     Comment: Procedure: CATARACT EXTRACTION PHACO AND               INTRAOCULAR LENS PLACEMENT (IOC);  Surgeon:               Estill Cotta, MD;  Location: [facility] ORS;                Service: Ophthalmology;  Laterality: Left;  [place]               59.2 AP% 24.4 CDE 29.2 Fluid Pack lot #               [id number] No date: CERVICAL DISC SURGERY     Comment: cervical decompression c4-5 and c5-6 No date: CTR     Comment: right hand No date: SHOULDER SURGERY     Comment: right shoulder No date: TRIGGER FINGER RELEASE     Comment: right finger BMI    Body Mass Index:  39.11 kg/m     Reproductive/Obstetrics                             Anesthesia Physical Anesthesia Plan  ASA: III  Anesthesia Plan: General   Post-op Pain Management:  Induction:   Airway Management Planned:   Additional Equipment:   Intra-op Plan:   Post-operative Plan:   Informed Consent: I have reviewed the patients History and Physical, chart, labs and discussed the procedure including the risks, benefits and alternatives for the proposed anesthesia with the patient or authorized representative who has indicated his/her understanding and acceptance.   Dental Advisory Given  Plan Discussed with: CRNA  Anesthesia Plan Comments:         Anesthesia Quick Evaluation

## 2015-12-09 NOTE — Anesthesia Postprocedure Evaluation (Deleted)
Anesthesia Post Note  Patient: Carolyn Graham  Procedure(s) Performed: Procedure(s) (LRB): CATARACT EXTRACTION PHACO AND INTRAOCULAR LENS PLACEMENT (IOC) (Left)  Anesthesia Post Evaluation  Last Vitals:  Vitals:   12/04/15 1040 12/04/15 1043  BP: (!) 150/87 (!) 144/89  Pulse: 69   Resp: 12 16  Temp: 36.6 C     Last Pain:  Vitals:   12/04/15 1040  TempSrc: Oral                 Molli Barrows

## 2015-12-09 NOTE — Anesthesia Postprocedure Evaluation (Signed)
Anesthesia Post Note  Patient: FELICITE NEPOMUCENO  Procedure(s) Performed: Procedure(s) (LRB): CATARACT EXTRACTION PHACO AND INTRAOCULAR LENS PLACEMENT (IOC) (Left)  Patient location during evaluation: PACU Anesthesia Type: General Level of consciousness: awake and alert Pain management: pain level controlled Vital Signs Assessment: post-procedure vital signs reviewed and stable Respiratory status: spontaneous breathing, nonlabored ventilation, respiratory function stable and patient connected to nasal cannula oxygen Cardiovascular status: blood pressure returned to baseline and stable Postop Assessment: no signs of nausea or vomiting Anesthetic complications: no    Last Vitals:  Vitals:   12/04/15 1040 12/04/15 1043  BP: (!) 150/87 (!) 144/89  Pulse: 69   Resp: 12 16  Temp: 36.6 C     Last Pain:  Vitals:   12/04/15 1040  TempSrc: Oral                 Molli Barrows

## 2015-12-18 ENCOUNTER — Encounter: Payer: Self-pay | Admitting: *Deleted

## 2015-12-18 ENCOUNTER — Ambulatory Visit: Payer: Managed Care, Other (non HMO) | Admitting: Anesthesiology

## 2015-12-18 ENCOUNTER — Ambulatory Visit
Admission: RE | Admit: 2015-12-18 | Discharge: 2015-12-18 | Disposition: A | Discharge status: Home / Self Care | Payer: Managed Care, Other (non HMO) | Source: Ambulatory Visit | Attending: Unknown Physician Specialty | Admitting: Unknown Physician Specialty

## 2015-12-18 ENCOUNTER — Encounter
Admission: RE | Disposition: A | Discharge status: Home / Self Care | Payer: Self-pay | Source: Ambulatory Visit | Attending: Unknown Physician Specialty

## 2015-12-18 DIAGNOSIS — R51 Headache: Secondary | ICD-10-CM | POA: Insufficient documentation

## 2015-12-18 DIAGNOSIS — K760 Fatty (change of) liver, not elsewhere classified: Secondary | ICD-10-CM | POA: Insufficient documentation

## 2015-12-18 DIAGNOSIS — G2581 Restless legs syndrome: Secondary | ICD-10-CM | POA: Insufficient documentation

## 2015-12-18 DIAGNOSIS — M1990 Unspecified osteoarthritis, unspecified site: Secondary | ICD-10-CM | POA: Diagnosis not present

## 2015-12-18 DIAGNOSIS — G5601 Carpal tunnel syndrome, right upper limb: Secondary | ICD-10-CM | POA: Diagnosis present

## 2015-12-18 DIAGNOSIS — I10 Essential (primary) hypertension: Secondary | ICD-10-CM | POA: Diagnosis not present

## 2015-12-18 DIAGNOSIS — E7800 Pure hypercholesterolemia, unspecified: Secondary | ICD-10-CM | POA: Diagnosis not present

## 2015-12-18 DIAGNOSIS — K222 Esophageal obstruction: Secondary | ICD-10-CM | POA: Insufficient documentation

## 2015-12-18 DIAGNOSIS — G4733 Obstructive sleep apnea (adult) (pediatric): Secondary | ICD-10-CM | POA: Insufficient documentation

## 2015-12-18 DIAGNOSIS — Z8673 Personal history of transient ischemic attack (TIA), and cerebral infarction without residual deficits: Secondary | ICD-10-CM | POA: Insufficient documentation

## 2015-12-18 DIAGNOSIS — E069 Thyroiditis, unspecified: Secondary | ICD-10-CM | POA: Diagnosis not present

## 2015-12-18 DIAGNOSIS — E0590 Thyrotoxicosis, unspecified without thyrotoxic crisis or storm: Secondary | ICD-10-CM | POA: Insufficient documentation

## 2015-12-18 DIAGNOSIS — R42 Dizziness and giddiness: Secondary | ICD-10-CM | POA: Diagnosis not present

## 2015-12-18 DIAGNOSIS — K219 Gastro-esophageal reflux disease without esophagitis: Secondary | ICD-10-CM | POA: Diagnosis not present

## 2015-12-18 DIAGNOSIS — M797 Fibromyalgia: Secondary | ICD-10-CM | POA: Insufficient documentation

## 2015-12-18 HISTORY — PX: CARPAL TUNNEL RELEASE: SHX101

## 2015-12-18 SURGERY — CARPAL TUNNEL RELEASE
Anesthesia: General | Laterality: Left

## 2015-12-18 MED ORDER — HYDROMORPHONE HCL 1 MG/ML IJ SOLN
INTRAMUSCULAR | Status: DC | PRN
  Administered 2015-12-18: 0.5 mg via INTRAVENOUS

## 2015-12-18 MED ORDER — PROPOFOL 10 MG/ML IV BOLUS
INTRAVENOUS | Status: DC | PRN
  Administered 2015-12-18: 140 mg via INTRAVENOUS

## 2015-12-18 MED ORDER — BUPIVACAINE HCL (PF) 0.5 % IJ SOLN
INTRAMUSCULAR | Status: DC | PRN
  Administered 2015-12-18: 5 mL

## 2015-12-18 MED ORDER — MIDAZOLAM HCL 2 MG/2ML IJ SOLN
INTRAMUSCULAR | Status: DC | PRN
  Administered 2015-12-18: 1 mg via INTRAVENOUS

## 2015-12-18 MED ORDER — LIDOCAINE HCL (CARDIAC) 20 MG/ML IV SOLN
INTRAVENOUS | Status: DC | PRN
  Administered 2015-12-18: 100 mg via INTRAVENOUS

## 2015-12-18 MED ORDER — KETOROLAC TROMETHAMINE 30 MG/ML IJ SOLN
INTRAMUSCULAR | Status: DC | PRN
  Administered 2015-12-18: 30 mg via INTRAVENOUS

## 2015-12-18 MED ORDER — BISOPROLOL FUMARATE 5 MG PO TABS
2.5000 mg | ORAL_TABLET | Freq: Once | ORAL | Status: DC
  Filled 2015-12-18: qty 0.5

## 2015-12-18 MED ORDER — FENTANYL CITRATE (PF) 100 MCG/2ML IJ SOLN
INTRAMUSCULAR | Status: DC | PRN
  Administered 2015-12-18: 100 ug via INTRAVENOUS

## 2015-12-18 MED ORDER — PHENYLEPHRINE HCL 10 MG/ML IJ SOLN
INTRAMUSCULAR | Status: DC | PRN
  Administered 2015-12-18: 200 ug via INTRAVENOUS

## 2015-12-18 MED ORDER — FENTANYL CITRATE (PF) 100 MCG/2ML IJ SOLN: 25.0000 ug | INTRAMUSCULAR | Status: DC | PRN

## 2015-12-18 MED ORDER — SCOPOLAMINE 1 MG/3DAYS TD PT72
1.0000 | MEDICATED_PATCH | Freq: Once | TRANSDERMAL | Status: DC
  Administered 2015-12-18: 1.5 mg via TRANSDERMAL

## 2015-12-18 MED ORDER — ONDANSETRON HCL 4 MG/2ML IJ SOLN: 4.0000 mg | Freq: Once | INTRAMUSCULAR | Status: DC | PRN

## 2015-12-18 MED ORDER — GLYCOPYRROLATE 0.2 MG/ML IJ SOLN
INTRAMUSCULAR | Status: DC | PRN
  Administered 2015-12-18: 0.2 mg via INTRAVENOUS

## 2015-12-18 MED ORDER — KETAMINE HCL 50 MG/ML IJ SOLN
INTRAMUSCULAR | Status: DC | PRN
  Administered 2015-12-18: 35 mg via INTRAVENOUS

## 2015-12-18 MED ORDER — BUPIVACAINE HCL (PF) 0.5 % IJ SOLN
INTRAMUSCULAR | Status: AC
  Filled 2015-12-18: qty 30

## 2015-12-18 MED ORDER — ONDANSETRON HCL 4 MG/2ML IJ SOLN
INTRAMUSCULAR | Status: DC | PRN
  Administered 2015-12-18: 4 mg via INTRAVENOUS

## 2015-12-18 MED ORDER — SCOPOLAMINE 1 MG/3DAYS TD PT72
MEDICATED_PATCH | TRANSDERMAL | Status: AC
  Filled 2015-12-18: qty 1

## 2015-12-18 MED ORDER — EPHEDRINE SULFATE 50 MG/ML IJ SOLN
INTRAMUSCULAR | Status: DC | PRN
  Administered 2015-12-18: 10 mg via INTRAVENOUS

## 2015-12-18 MED ORDER — LACTATED RINGERS IV SOLN
INTRAVENOUS | Status: DC
  Administered 2015-12-18: 09:00:00 via INTRAVENOUS

## 2015-12-18 MED ORDER — DEXAMETHASONE SODIUM PHOSPHATE 4 MG/ML IJ SOLN
INTRAMUSCULAR | Status: DC | PRN
  Administered 2015-12-18: 5 mg via INTRAVENOUS

## 2015-12-18 SURGICAL SUPPLY — 25 items
BANDAGE ELASTIC 2 LF NS (GAUZE/BANDAGES/DRESSINGS) ×3 IMPLANT
BNDG ESMARK 4X12 TAN STRL LF (GAUZE/BANDAGES/DRESSINGS) ×3 IMPLANT
CHLORAPREP W/TINT 26ML (MISCELLANEOUS) ×3 IMPLANT
CUFF TOURN 18 STER (MISCELLANEOUS) ×3 IMPLANT
ELECT REM PT RETURN 9FT ADLT (ELECTROSURGICAL) ×3
ELECTRODE REM PT RTRN 9FT ADLT (ELECTROSURGICAL) ×1 IMPLANT
GAUZE SPONGE 4X4 12PLY STRL (GAUZE/BANDAGES/DRESSINGS) ×3 IMPLANT
GLOVE BIO SURGEON STRL SZ7.5 (GLOVE) ×3 IMPLANT
GLOVE BIO SURGEON STRL SZ8 (GLOVE) ×6 IMPLANT
GLOVE BIOGEL M STRL SZ7.5 (GLOVE) ×3 IMPLANT
GLOVE INDICATOR 8.0 STRL GRN (GLOVE) ×3 IMPLANT
GOWN STRL REUS W/ TWL LRG LVL3 (GOWN DISPOSABLE) ×2 IMPLANT
GOWN STRL REUS W/TWL LRG LVL3 (GOWN DISPOSABLE) ×4
KIT RM TURNOVER STRD PROC AR (KITS) ×3 IMPLANT
NS IRRIG 500ML POUR BTL (IV SOLUTION) ×3 IMPLANT
PACK EXTREMITY ARMC (MISCELLANEOUS) ×3 IMPLANT
PADDING CAST 2X4YD ST (MISCELLANEOUS) ×2
PADDING CAST BLEND 2X4 STRL (MISCELLANEOUS) ×1 IMPLANT
SOL PREP PVP 2OZ (MISCELLANEOUS) ×3
SOLUTION PREP PVP 2OZ (MISCELLANEOUS) ×1 IMPLANT
SPLINT CAST 1 STEP 3X12 (MISCELLANEOUS) ×3 IMPLANT
STOCKINETTE STRL 4IN 9604848 (GAUZE/BANDAGES/DRESSINGS) ×3 IMPLANT
SUT ETHILON 4-0 (SUTURE) ×2
SUT ETHILON 4-0 FS2 18XMFL BLK (SUTURE) ×1
SUTURE ETHLN 4-0 FS2 18XMF BLK (SUTURE) ×1 IMPLANT

## 2015-12-18 NOTE — Discharge Instructions (Signed)
Ice pack Elevation RTC in about 10 days   AMBULATORY SURGERY  DISCHARGE INSTRUCTIONS   1) The drugs that you were given will stay in your system until tomorrow so for the next 24 hours you should not:  A) Drive an automobile B) Make any legal decisions C) Drink any alcoholic beverage   2) You may resume regular meals tomorrow.  Today it is better to start with liquids and gradually work up to solid foods.  You may eat anything you prefer, but it is better to start with liquids, then soup and crackers, and gradually work up to solid foods.   3) Please notify your doctor immediately if you have any unusual bleeding, trouble breathing, redness and pain at the surgery site, drainage, fever, or pain not relieved by medication.    4) Additional Instructions:        Please contact your physician with any problems or Same Day Surgery at [phone], Monday through Friday 6 am to 4 pm, or [place] at [facility] number at [phone].

## 2015-12-18 NOTE — Anesthesia Preprocedure Evaluation (Signed)
Anesthesia Evaluation  Patient identified by MRN, date of birth, ID band Patient awake    Reviewed: Allergy & Precautions, H&P , NPO status , Patient's Chart, lab work & pertinent test results, reviewed documented beta blocker date and time   History of Anesthesia Complications (+) PONV and history of anesthetic complications  Airway Mallampati: III  TM Distance: >3 FB Neck ROM: full    Dental  (+) Caps, Teeth Intact   Pulmonary neg shortness of breath, sleep apnea and Continuous Positive Airway Pressure Ventilation , neg COPD, neg recent URI,           Cardiovascular Exercise Tolerance: Good hypertension, (-) angina(-) CAD, (-) Past MI, (-) Cardiac Stents and (-) CABG (-) dysrhythmias (-) Valvular Problems/Murmurs     Neuro/Psych  Headaches, neg Seizures TIAnegative psych ROS   GI/Hepatic GERD  ,NAFLD   Endo/Other  neg diabetesMorbid obesity  Renal/GU negative Renal ROS  negative genitourinary   Musculoskeletal   Abdominal   Peds  Hematology negative hematology ROS (+)   Anesthesia Other Findings Past Medical History: No date: Arthritis 11/2013: Broken ankle     Comment: right ankle No date: Cataract     Comment: Bil/ surgery on right eye No date: Fatty liver 2005: Fibromyalgia No date: GERD (gastroesophageal reflux disease)     Comment: uses prilosec No date: Headache(784.0)     Comment: not as often as before No date: Hypercholesteremia No date: Hypertension No date: Hyperthyroidism No date: OSA (obstructive sleep apnea)     Comment: CPAP No date: Post-operative nausea and vomiting No date: Restless leg syndrome No date: Schatzki's ring 2015: Stroke ([place])     Comment: TIA.  unable to determine if this is true. No date: Thyroiditis No date: Vertigo   Reproductive/Obstetrics negative OB ROS                             Anesthesia Physical Anesthesia Plan  ASA:  II  Anesthesia Plan: General   Post-op Pain Management:    Induction:   Airway Management Planned:   Additional Equipment:   Intra-op Plan:   Post-operative Plan:   Informed Consent: I have reviewed the patients History and Physical, chart, labs and discussed the procedure including the risks, benefits and alternatives for the proposed anesthesia with the patient or authorized representative who has indicated his/her understanding and acceptance.   Dental Advisory Given  Plan Discussed with: Anesthesiologist, CRNA and Surgeon  Anesthesia Plan Comments:         Anesthesia Quick Evaluation

## 2015-12-18 NOTE — Transfer of Care (Signed)
Immediate Anesthesia Transfer of Care Note  Patient: Carolyn Graham  Procedure(s) Performed: Procedure(s): CARPAL TUNNEL RELEASE (Left)  Patient Location: PACU  Anesthesia Type:General  Level of Consciousness: awake, alert , oriented and patient cooperative  Airway & Oxygen Therapy: Patient Spontanous Breathing and Patient connected to nasal cannula oxygen  Post-op Assessment: Report given to RN and Post -op Vital signs reviewed and stable  Post vital signs: Reviewed and stable  Last Vitals:  Vitals:   12/18/15 0841  BP: 118/64  Pulse: 84  Resp: 18  Temp: 36.9 C    Last Pain:  Vitals:   12/18/15 0841  TempSrc: Oral  PainSc: 3          Complications: No apparent anesthesia complications

## 2015-12-18 NOTE — Anesthesia Procedure Notes (Signed)
Procedure Name: LMA Insertion Date/Time: 12/18/2015 10:04 AM Performed by: Rosaria Ferries, Trong Gosling Pre-anesthesia Checklist: Patient identified, Emergency Drugs available, Suction available and Patient being monitored Patient Re-evaluated:Patient Re-evaluated prior to inductionOxygen Delivery Method: Circle system utilized Preoxygenation: Pre-oxygenation with 100% oxygen Intubation Type: IV induction LMA Size: 4.0 Placement Confirmation: breath sounds checked- equal and bilateral Tube secured with: Tape Dental Injury: Teeth and Oropharynx as per pre-operative assessment

## 2015-12-18 NOTE — H&P (Signed)
  H and P reviewed. No changes. Uploaded at later date. 

## 2015-12-18 NOTE — Anesthesia Postprocedure Evaluation (Signed)
Anesthesia Post Note  Patient: Carolyn Graham  Procedure(s) Performed: Procedure(s) (LRB): CARPAL TUNNEL RELEASE (Left)  Patient location during evaluation: PACU Anesthesia Type: General Level of consciousness: awake and alert Pain management: pain level controlled Vital Signs Assessment: post-procedure vital signs reviewed and stable Respiratory status: spontaneous breathing, nonlabored ventilation, respiratory function stable and patient connected to nasal cannula oxygen Cardiovascular status: blood pressure returned to baseline and stable Postop Assessment: no signs of nausea or vomiting Anesthetic complications: no    Last Vitals:  Vitals:   12/18/15 1125 12/18/15 1140  BP: 130/74 128/78  Pulse: 76 79  Resp: (!) 9 14  Temp:  36.4 C    Last Pain:  Vitals:   12/18/15 1055  TempSrc:   PainSc: Asleep                 Martha Clan

## 2015-12-18 NOTE — Op Note (Signed)
DATE OF SURGERY:  12/18/2015  PATIENT NAME:  Carolyn Graham   DOB: 10-12-1955  MRN: [id number]  PRE-OPERATIVE DIAGNOSIS: Right carpal tunnel syndrome  POST-OPERATIVE DIAGNOSIS:  Same  PROCEDURE: Right carpal tunnel release  SURGEON: Dr. Leanor Kail, Brooke Bonito. M.D.  ANESTHESIA: Gen.   INDICATIONS FOR SURGERY: Carolyn Graham is a 60 y.o. year old female with a long history of numbness and paresthesias in the right hand. Nerve conduction studies demonstrated findings consistent with severe right  median nerve compression.The patient had not seen any significant improvement despite conservative nonsurgical intervention. After discussion of the risks and benefits of surgical intervention, the patient expressed understanding of the risks benefits and agreed with plans for carpal tunnel release.   PROCEDURE IN DETAIL: The patient was taken the operating room where satisfactory general anesthesia was achieved. A tourniquet was placed on the patient's right upper arm.The right hand and arm were prepped  and draped in the usual sterile fashion. A "time-out" was performed as per usual protocol. The hand and forearm were exsanguinated using an Esmarch and the tourniquet was inflated to 250 mmHg.  An incision was made just ulnar to the thenar palmar crease. Dissection was carried down through the palmar fascia to the transverse carpal ligament. The transverse carpal ligament was sharply incised, taking care to protect the underlying structures within the carpal tunnel. Complete release of the transverse carpal ligament was achieved. There was no evidence of a mass or proliferative synovitis within the carpal tunnel. The median nerve did appear to be slightly flattened. The wound was irrigated with saline. The tourniquet was released at this time. It had been up for about 12 minutes. Bleeding was controlled with digital pressure and coagulation cautery. I did inject the subcutaneous tissue of the wound with about 5  cc of 0.5% Marcaine without epinephrine. The skin was then re-approximated with interrupted sutures of #4-0 nylon. A sterile dressing was applied followed by application of a volar splint.  The patient was awakened and transferred to a stretcher bed.  The patient tolerated the procedure well and was transported to the PACU in stable condition. Blood loss was negligible.  Dr. Mariel Graham. M.D.

## 2015-12-20 ENCOUNTER — Encounter: Payer: Self-pay | Admitting: Unknown Physician Specialty

## 2016-02-24 HISTORY — PX: CARPAL TUNNEL RELEASE: SHX101

## 2016-02-24 HISTORY — PX: LUMBAR SPINE SURGERY: SHX701

## 2016-02-25 ENCOUNTER — Emergency Department
Admission: EM | Admit: 2016-02-25 | Discharge: 2016-02-25 | Disposition: A | Discharge status: Home / Self Care | Payer: Managed Care, Other (non HMO) | Attending: Emergency Medicine | Admitting: Emergency Medicine

## 2016-02-25 ENCOUNTER — Emergency Department: Payer: Managed Care, Other (non HMO)

## 2016-02-25 DIAGNOSIS — Z7982 Long term (current) use of aspirin: Secondary | ICD-10-CM | POA: Diagnosis not present

## 2016-02-25 DIAGNOSIS — I10 Essential (primary) hypertension: Secondary | ICD-10-CM | POA: Diagnosis not present

## 2016-02-25 DIAGNOSIS — M7662 Achilles tendinitis, left leg: Secondary | ICD-10-CM | POA: Diagnosis not present

## 2016-02-25 DIAGNOSIS — M79662 Pain in left lower leg: Secondary | ICD-10-CM | POA: Diagnosis present

## 2016-02-25 MED ORDER — TRAMADOL HCL 50 MG PO TABS: 50.0000 mg | ORAL_TABLET | Freq: Four times a day (QID) | ORAL | 0 refills | Status: DC | PRN

## 2016-02-25 MED ORDER — NAPROXEN 500 MG PO TABS
500.0000 mg | ORAL_TABLET | Freq: Once | ORAL | Status: AC
  Administered 2016-02-25: 500 mg via ORAL
  Filled 2016-02-25: qty 1

## 2016-02-25 MED ORDER — NAPROXEN 500 MG PO TABS: 500.0000 mg | ORAL_TABLET | Freq: Two times a day (BID) | ORAL | 0 refills | Status: DC

## 2016-02-25 MED ORDER — TRAMADOL HCL 50 MG PO TABS
50.0000 mg | ORAL_TABLET | Freq: Once | ORAL | Status: AC
  Administered 2016-02-25: 50 mg via ORAL
  Filled 2016-02-25: qty 1

## 2016-02-25 NOTE — Discharge Instructions (Signed)
Wear walking boot for 7-10 days as needed. Follow-up orthopedic clinic if no improvement in 10 days. Return by ER if condition worsens.

## 2016-02-25 NOTE — ED Provider Notes (Signed)
[facility] Emergency Department Provider Note   ____________________________________________   First MD Initiated Contact with Patient 02/25/16 [phone]     (approximate)  I have reviewed the triage vital signs and the nursing notes.   HISTORY  Chief Complaint Leg Pain    HPI Carolyn Graham is a 61 y.o. female patient complain of pain to the left lower calf secondary to a "popping" sensation while walking. Patient states pain increases with dorsiflexion of the foot. No palliative measures taken for this complaint.Patient rates the pain as a 5/10 which increases to a 10 over 10 with dorsiflexion.   Past Medical History:  Diagnosis Date  . Arthritis   . Broken ankle 11/2013   right ankle  . Cataract    Bil/ surgery on right eye  . Fatty liver   . Fibromyalgia 2005  . GERD (gastroesophageal reflux disease)    uses prilosec  . Headache(784.0)    not as often as before  . Hypercholesteremia   . Hypertension   . Hyperthyroidism   . OSA (obstructive sleep apnea)    CPAP  . Post-operative nausea and vomiting   . Restless leg syndrome   . Schatzki's ring   . Stroke [facility]) 2015   TIA.  unable to determine if this is true.  . Thyroiditis   . Vertigo     Patient Active Problem List   Diagnosis Date Noted  . Closed fracture of fibula 01/09/2014  . OSA (obstructive sleep apnea) 01/06/2011  . Restless legs syndrome 01/06/2011  . Insomnia 01/06/2011  . Fibromyalgia 01/06/2011    Past Surgical History:  Procedure Laterality Date  . CARPAL TUNNEL RELEASE Left 12/18/2015   Procedure: CARPAL TUNNEL RELEASE;  Surgeon: Leanor Kail, MD;  Location: [facility] ORS;  Service: Orthopedics;  Laterality: Left;  . CATARACT EXTRACTION W/PHACO Right 11/13/2015   Procedure: CATARACT EXTRACTION PHACO AND INTRAOCULAR LENS PLACEMENT (IOC);  Surgeon: Estill Cotta, MD;  Location: [facility] ORS;  Service: Ophthalmology;  Laterality: Right;  [place] 01:08AP% 21.6CDE 26.99Fluid  pack lot # [id number]  . CATARACT EXTRACTION W/PHACO Left 12/04/2015   Procedure: CATARACT EXTRACTION PHACO AND INTRAOCULAR LENS PLACEMENT (IOC);  Surgeon: Estill Cotta, MD;  Location: [facility] ORS;  Service: Ophthalmology;  Laterality: Left;  [place] 59.2 AP% 24.4 CDE 29.2 Fluid Pack lot # [id number]  . CERVICAL DISC SURGERY     cervical decompression c4-5 and c5-6  . CTR     right hand  . SHOULDER SURGERY     right shoulder  . TRIGGER FINGER RELEASE     right finger    Prior to Admission medications   Medication Sig Start Date End Date Taking? Authorizing Provider  alendronate (FOSAMAX) 70 MG tablet Take 70 mg by mouth once a week. Take with a full glass of water on an empty stomach.    Historical Provider, MD  aspirin 81 MG tablet Take 81 mg by mouth daily.    Historical Provider, MD  bisoprolol-hydrochlorothiazide [facility]) 2.5-6.25 MG per tablet daily.  03/17/14   Historical Provider, MD  Calcium Carbonate-Vitamin D (CALCIUM 600+D PO) Take by mouth daily.    Historical Provider, MD  Cholecalciferol (VITAMIN D) 2000 units CAPS Take by mouth daily.    Historical Provider, MD  CRESTOR 10 MG tablet 10 mg at bedtime.  05/01/13   Historical Provider, MD  CYMBALTA 30 MG capsule 30 mg daily.  12/01/10   Historical Provider, MD  Difluprednate (DUREZOL) 0.05 % EMUL Apply to eye daily. One  drop right eye daily    Historical Provider, MD  MAGNESIUM ASPARTATE PO Take 600 mg by mouth daily.     Historical Provider, MD  meloxicam (MOBIC) 15 MG tablet Take 15 mg by mouth daily.    Historical Provider, MD  moxifloxacin (VIGAMOX) 0.5 % ophthalmic solution 1 drop as directed.    Historical Provider, MD  naproxen (NAPROSYN) 500 MG tablet Take 1 tablet (500 mg total) by mouth 2 (two) times daily with a meal. 02/25/16   Sable Feil, PA-C  Nepafenac (ILEVRO OP) Apply to eye 2 (two) times daily. Ilevro 3 % eye drops-One drop right eye bid    Historical Provider, MD  omeprazole (PRILOSEC) 20 MG capsule 20 mg daily.   10/10/10   Historical Provider, MD  pramipexole (MIRAPEX) 1 MG tablet Take 6 mg by mouth at bedtime.    Historical Provider, MD  rosuvastatin (CRESTOR) 10 MG tablet Take 10 mg by mouth daily. Duplicate..I have attempted to contact this patient by telephone, but there is no answer repeatedly. I will continue to try later. NOT TAKING    Historical Provider, MD  SUMAtriptan (IMITREX) 100 MG tablet Take 100 mg by mouth every 2 (two) hours as needed.      Historical Provider, MD  tiZANidine (ZANAFLEX) 4 MG tablet Take 4 mg by mouth at bedtime.  06/01/12   Historical Provider, MD  traMADol (ULTRAM) 50 MG tablet Take 1 tablet (50 mg total) by mouth every 6 (six) hours as needed. 02/25/16 02/24/17  Sable Feil, PA-C  valACYclovir (VALTREX) 1000 MG tablet Take 1,000 mg by mouth as needed.      Historical Provider, MD  vitamin B-12 (CYANOCOBALAMIN) 1000 MCG tablet Take 1,000 mcg by mouth daily.    Historical Provider, MD  zolpidem (AMBIEN) 10 MG tablet Take 10 mg by mouth at bedtime.    Historical Provider, MD    Allergies   Family History  Problem Relation Age of Onset  . Colon cancer Mother 52  . Lung cancer Father 92  . Colon cancer Maternal Grandfather   . Breast cancer Neg Hx     Social History Social History  Substance Use Topics  . Smoking status: Never Smoker  . Smokeless tobacco: Never Used  . Alcohol use Yes     Comment: OCCAS    Review of Systems Constitutional: No fever/chills Eyes: No visual changes. ENT: No sore throat. Cardiovascular: Denies chest pain. Respiratory: Denies shortness of breath. Gastrointestinal: No abdominal pain.  No nausea, no vomiting.  No diarrhea.  No constipation. Genitourinary: Negative for dysuria. Musculoskeletal: Left calf pain Skin: Negative for rash. Neurological: Negative for headaches, focal weakness or numbness. Endocrine:Hyperlipidemia, hypertension, and hypothyroidism, ____________________________________________   PHYSICAL  EXAM:  VITAL SIGNS: ED Triage Vitals  Enc Vitals Group     BP 02/25/16 0746 119/73     Pulse Rate 02/25/16 0746 80     Resp 02/25/16 0746 16     Temp 02/25/16 0746 97.7 F (36.5 C)     Temp Source 02/25/16 0746 Oral     SpO2 02/25/16 0746 97 %     Weight 02/25/16 0747 210 lb (95.3 kg)     Height 02/25/16 0747 5\' 1"  (1.549 m)     Head Circumference --      Peak Flow --      Pain Score --      Pain Loc --      Pain Edu? --      Excl.  in [place]? --     Constitutional: Alert and oriented. Well appearing and in no acute distress. Eyes: Conjunctivae are normal. PERRL. EOMI. Head: Atraumatic. Nose: No congestion/rhinnorhea. Mouth/Throat: Mucous membranes are moist.  Oropharynx non-erythematous. Neck: No stridor.  No cervical spine tenderness to palpation. Hematological/Lymphatic/Immunilogical: No cervical lymphadenopathy. Cardiovascular: Normal rate, regular rhythm. Grossly normal heart sounds.  Good peripheral circulation. Respiratory: Normal respiratory effort.  No retractions. Lungs CTAB. Gastrointestinal: Soft and nontender. No distention. No abdominal bruits. No CVA tenderness. Musculoskeletal: No obvious deformity. Moderate guarding palpation inferior calf in origin of the Achilles tendon. Aspect of the Achilles tendon. Decreased range of motion with dorsiflexion of the foot.  Neurologic:  Normal speech and language. No gross focal neurologic deficits are appreciated. No gait instability. Skin:  Skin is warm, dry and intact. No rash noted. Psychiatric: Mood and affect are normal. Speech and behavior are normal.  ____________________________________________   LABS (all labs ordered are listed, but only abnormal results are displayed)  Labs Reviewed - No data to display ____________________________________________  EKG   ____________________________________________  RADIOLOGY  No acute findings on ultrasound of the Achilles  tendon. ____________________________________________   PROCEDURES  Procedure(s) performed: None  Procedures  Critical Care performed: No  ____________________________________________   INITIAL IMPRESSION / ASSESSMENT AND PLAN / ED COURSE  Pertinent labs & imaging results that were available during my care of the patient were reviewed by me and considered in my medical decision making (see chart for details).  Achilles tendinitis. Patient given discharge care instructions. Patient advised to wear a walking boot and a take tramadol and naproxen as directed. Follow-up orthopedics no improvement in 10 days. Clinical Course    Patient presents with pain to the left foot and ankle with dorsal and plantar flexion. Incident status post popping sensation when walking. Patient has been increasing her exercise disc states the muscles in her leg states she might a overworked extremity. Discussed ultrasound shows no acute findings. Patient will wear a walking boot and follow orthopedics no improvement in 7-10 days.  ____________________________________________   FINAL CLINICAL IMPRESSION(S) / ED DIAGNOSES  Final diagnoses:  Achilles tendinitis, left leg      NEW MEDICATIONS STARTED DURING THIS VISIT:  New Prescriptions   NAPROXEN (NAPROSYN) 500 MG TABLET    Take 1 tablet (500 mg total) by mouth 2 (two) times daily with a meal.   TRAMADOL (ULTRAM) 50 MG TABLET    Take 1 tablet (50 mg total) by mouth every 6 (six) hours as needed.     Note:  This document was prepared using Dragon voice recognition software and may include unintentional dictation errors.    Sable Feil, PA-C 02/25/16 [place], MD 02/25/16 1048

## 2016-02-25 NOTE — ED Triage Notes (Signed)
States she was walking in hallway and felt a pop in her left calf, states she had a boot at home so she put in on, ambulatory to lobby

## 2016-02-25 NOTE — ED Notes (Signed)
See triage note   States she felt a pop to posterior left calf  Denies any fall   Positive pulses

## 2016-05-06 ENCOUNTER — Other Ambulatory Visit: Payer: Self-pay | Admitting: *Deleted

## 2016-05-06 ENCOUNTER — Ambulatory Visit (INDEPENDENT_AMBULATORY_CARE_PROVIDER_SITE_OTHER): Payer: Managed Care, Other (non HMO) | Admitting: Podiatry

## 2016-05-06 ENCOUNTER — Encounter: Payer: Self-pay | Admitting: Podiatry

## 2016-05-06 ENCOUNTER — Ambulatory Visit (INDEPENDENT_AMBULATORY_CARE_PROVIDER_SITE_OTHER): Payer: Managed Care, Other (non HMO)

## 2016-05-06 ENCOUNTER — Encounter: Payer: Self-pay | Admitting: *Deleted

## 2016-05-06 VITALS — BP 134/83 | HR 90 | Resp 16

## 2016-05-06 DIAGNOSIS — I10 Essential (primary) hypertension: Secondary | ICD-10-CM | POA: Insufficient documentation

## 2016-05-06 DIAGNOSIS — M722 Plantar fascial fibromatosis: Secondary | ICD-10-CM

## 2016-05-06 DIAGNOSIS — B002 Herpesviral gingivostomatitis and pharyngotonsillitis: Secondary | ICD-10-CM | POA: Insufficient documentation

## 2016-05-06 NOTE — Progress Notes (Signed)
She presents today chief complaint of plantar heel pain left. Has had a history of plantar fasciitis in the past and knows what it feels like. She states that this is only been present for the past couple of weeks and I would like to get it taken care of quickly. She states that she's been utilizing her night splint that she used for the right foot and it has helped to some degree. He is also utilizing meloxicam which she had for the right foot.  Objective: Vital signs are stable she's alert and oriented 3 pulses are palpable. Neurological systems intact. She is pain on palpation medially for continued tubercle of the left heel. Radiographs taken today confirm soft tissue increased density at the plantar fascia calcaneal insertion site. No open lesions or wounds are noted on cutaneous evaluation.  Assessment: Pain in limb secondary to plantar fasciitis left foot.  Plan: Injected the left heel today with Kenalog and local anesthetic she will continue her meloxicam. She will continue her plantar fascia brace and night splint. Discussed this with her in great detail today discussed proper shoe gear stretching exercise and ice therapy.

## 2016-05-06 NOTE — Patient Instructions (Signed)

## 2016-05-25 ENCOUNTER — Other Ambulatory Visit: Payer: Self-pay | Admitting: Rheumatology

## 2016-05-26 NOTE — Telephone Encounter (Signed)
Last Visit: 11/01/15 Next Visit was due March 2018. Message sent to front to schedule patient.   Okay to refill Ambien?

## 2016-05-28 ENCOUNTER — Telehealth: Payer: Self-pay | Admitting: Rheumatology

## 2016-05-28 NOTE — Telephone Encounter (Signed)
Patient is scheduled to see Mr. Carlyon Shadow on 06/16/16.  Thank you.

## 2016-05-28 NOTE — Telephone Encounter (Signed)
-----   Message from Carole Binning, LPN sent at 03/31/2033  9:34 AM EDT ----- Regarding: Please schedule patient a follow up visit Please schedule patient a follow up visit. Patient was due March 2018. Thanks!

## 2016-06-10 ENCOUNTER — Encounter: Payer: Self-pay | Admitting: Podiatry

## 2016-06-10 ENCOUNTER — Ambulatory Visit (INDEPENDENT_AMBULATORY_CARE_PROVIDER_SITE_OTHER): Payer: Managed Care, Other (non HMO) | Admitting: Podiatry

## 2016-06-10 DIAGNOSIS — M722 Plantar fascial fibromatosis: Secondary | ICD-10-CM

## 2016-06-10 NOTE — Progress Notes (Signed)
She presents today chief complaint of pain to the bilateral foot. Status of the left foot is some better with the right foot is painful.  Objective: Vital signs are stable she is alert and oriented 3 she has pain on palpation medial calcaneal tubercles bilateral.  Assessment: Fasciitis bilateral.  Plan: Continue conservative therapies injected bilateral heels today follow-up with her.

## 2016-06-12 NOTE — Progress Notes (Addendum)
Office Visit Note  Patient: Carolyn Graham             Date of Birth: Oct 16, 1955           MRN: [id number]             PCP: Juluis Pitch, MD Referring: Juluis Pitch, MD Visit Date: 06/16/2016 Occupation: @GUAROCC @    Subjective:  Follow-up (Need labs, wants Tramadol RF)   History of Present Illness: Carolyn Graham is a 61 y.o. female    Last seen 11/01/2015.  Patient has had a very difficult winter as a result of fibromyalgia flaring.  She is also having left knee pain and she states that she saw another doctor to get a left knee injection We gave her cortisone about February 2017. Patient states that over the last 4-5 months ago she got another cortisone injection at that time. We had discussed Visco supplementation at the last visit and patient woullike to move forward with that.  She continues to take her medication as prescribed. She is tolerating the medication well and they are effective for her.  She continues to try to exercise for her fibromyalgia.    Activities of Daily Living:  Patient reports morning stiffness for 15 minutes.   Patient Denies nocturnal pain.  Difficulty dressing/grooming: Denies Difficulty climbing stairs: Denies Difficulty getting out of chair: Denies Difficulty using hands for taps, buttons, cutlery, and/or writing: Denies   Review of Systems  Constitutional: Negative for fatigue.  HENT: Negative for mouth sores and mouth dryness.   Eyes: Negative for dryness.  Respiratory: Negative for shortness of breath.   Gastrointestinal: Negative for constipation and diarrhea.  Musculoskeletal: Negative for myalgias and myalgias.  Skin: Negative for sensitivity to sunlight.  Psychiatric/Behavioral: Negative for decreased concentration and sleep disturbance.    PMFS History:  Patient Active Problem List   Diagnosis Date Noted  . Benign essential hypertension 05/06/2016  . Oral herpes 05/06/2016  . Carpal tunnel syndrome of left wrist  11/08/2015  . Hyperlipidemia, unspecified 05/10/2015  . Osteopenia 11/05/2014  . Closed fracture of fibula 01/09/2014  . Sleep apnea 01/06/2011  . Restless leg syndrome 01/06/2011  . Insomnia 01/06/2011  . Fibromyalgia 01/06/2011  . GERD (gastroesophageal reflux disease) 12/15/2007    Past Medical History:  Diagnosis Date  . Arthritis   . Broken ankle 11/2013   right ankle  . Cataract    Bil/ surgery on right eye  . Fatty liver   . Fibromyalgia 2005  . GERD (gastroesophageal reflux disease)    uses prilosec  . Headache(784.0)    not as often as before  . Hypercholesteremia   . Hypertension   . Hyperthyroidism   . OSA (obstructive sleep apnea)    CPAP  . Plantar fasciitis   . Post-operative nausea and vomiting   . Restless leg syndrome   . Schatzki's ring   . Stroke [facility]) 2015   TIA.  unable to determine if this is true.  . Thyroiditis   . Vertigo     Family History  Problem Relation Age of Onset  . Colon cancer Mother 86  . Lung cancer Father 40  . Colon cancer Maternal Grandfather   . Breast cancer Neg Hx    Past Surgical History:  Procedure Laterality Date  . CARPAL TUNNEL RELEASE Left 12/18/2015   Procedure: CARPAL TUNNEL RELEASE;  Surgeon: Leanor Kail, MD;  Location: [facility] ORS;  Service: Orthopedics;  Laterality: Left;  . CATARACT EXTRACTION W/PHACO Right  11/13/2015   Procedure: CATARACT EXTRACTION PHACO AND INTRAOCULAR LENS PLACEMENT (IOC);  Surgeon: Estill Cotta, MD;  Location: [facility] ORS;  Service: Ophthalmology;  Laterality: Right;  [place] 01:08AP% 21.6CDE 26.99Fluid pack lot # [id number]  . CATARACT EXTRACTION W/PHACO Left 12/04/2015   Procedure: CATARACT EXTRACTION PHACO AND INTRAOCULAR LENS PLACEMENT (IOC);  Surgeon: Estill Cotta, MD;  Location: [facility] ORS;  Service: Ophthalmology;  Laterality: Left;  [place] 59.2 AP% 24.4 CDE 29.2 Fluid Pack lot # [id number]  . CERVICAL DISC SURGERY     cervical decompression c4-5 and c5-6  . CTR     right hand  .  SHOULDER SURGERY     right shoulder  . TRIGGER FINGER RELEASE     right finger   Social History   Social History Narrative   Married to [place], no natural children   Right handed   Bachelor's degree   2-3 cups daily           Objective: Vital Signs: BP (!) 148/89 (BP Location: Left Arm, Patient Position: Sitting, Cuff Size: Normal)   Pulse 80   Resp 15   Ht 5\' 1"  (1.549 m)   Wt 211 lb (95.7 kg)   BMI 39.87 kg/m    Physical Exam  Constitutional: She is oriented to person, place, and time. She appears well-developed and well-nourished.  HENT:  Head: Normocephalic and atraumatic.  Eyes: EOM are normal. Pupils are equal, round, and reactive to light.  Cardiovascular: Normal rate, regular rhythm and normal heart sounds.  Exam reveals no gallop and no friction rub.   No murmur heard. Pulmonary/Chest: Effort normal and breath sounds normal. She has no wheezes. She has no rales.  Abdominal: Soft. Bowel sounds are normal. She exhibits no distension. There is no tenderness. There is no guarding. No hernia.  Musculoskeletal: Normal range of motion. She exhibits no edema, tenderness or deformity.  Lymphadenopathy:    She has no cervical adenopathy.  Neurological: She is alert and oriented to person, place, and time. Coordination normal.  Skin: Skin is warm and dry. Capillary refill takes less than 2 seconds. No rash noted.  Psychiatric: She has a normal mood and affect. Her behavior is normal.  Nursing note and vitals reviewed.    Musculoskeletal Exam:   full range of motion of all joints Grip strength is equal and strong bilaterally Fibromyalgia tender points are 18 out of 18 positive  CDAI Exam: CDAI Homunculus Exam:   Joint Counts:  CDAI Tender Joint count: 0 CDAI Swollen Joint count: 0  Global Assessments:  Patient Global Assessment: 7 Provider Global Assessment: 7  CDAI Calculated Score: 14  No synovitis on examination  Investigation: No additional  findings.  Labs 11/01/15: CMP- AST 55 H, ALT 74 H.   Lipid panel- Cholestreol 234 H, (LDL 149 H) HgbA1c- 5.7 H    Imaging: No results found.  Speciality Comments: No specialty comments available.    Procedures:  No procedures performed Allergies: Codeine; Codeine sulfate; and Darvocet [propoxyphene n-acetaminophen]   Assessment / Plan:     Visit Diagnoses: Fibromyalgia - Plan: CBC with Differential/Platelet, COMPLETE METABOLIC PANEL WITH GFR, Pain Mgmt, Profile 5 w/Conf, U  Insomnia, unspecified type - Ambien 10mg   Fatigue, unspecified type - Plan: CBC with Differential/Platelet, COMPLETE METABOLIC PANEL WITH GFR, Pain Mgmt, Profile 5 w/Conf, U  Chronic pain of left knee  Primary osteoarthritis of both knees  Pain management - Plan: Pain Mgmt, Profile 5 w/Conf, U  Sleep apnea, unspecified type  Carpal tunnel  syndrome of left wrist  Osteopenia, unspecified location  Restless leg syndrome - Zanaflex 1mg  po qhs.  Primary osteoarthritis of right knee - S/p cortisone injection 04/04/2015.Xray 03/2015 showed severe-moderate medial compartment narrowing bilaterally.   Plan: #1: Fibromyalgia syndrome She rates her discomfort as 7 on a scale of 0-10. She has 18 out of 18 tender points. Patient states "I had a very bad winter."  #2: Fatigue. Of 0-10. Patient is trying to exercise but her pain prevents her from doing regular exercise  #3: Left knee joint pain. She's been struggling with the left knee and she feels that she overused her left knee when she had her right ankle injury.  She has been struggling with the left knee since then. Right KNEE ALSO HURTS. We've given her cortisone injection approximately a year ago then she received a cortisone from another doctor about 6 months ago and patient is elected to go forward with Visco supplementation.  Today, she would like [place] to apply for Hyalgan 5 both knees; Euflexxa or Orthovisc acceptable also depending on the insurance  company  #4: Insomnia. Patient is using Ambien without any problems  #5: CBC with differential, CMP with GFR, urine drug screen today  #6: Patient wants her prescriptions printed Voltaren gel 3 tubes with 3 refills;  Zanaflex 4 mg 1 by mouth daily at bedtime ninety-day supply with refills Ambien 10 mg 1 by mouth daily at bedtimeninety-day supply ith 1 refill (New Rx:  Baclofen, 10mg ; 1 at 7 am and 1 at 2pm prn muscle spasm.; 90 day supply w/ 1 refill).    Orders: Orders Placed This Encounter  Procedures  . CBC with Differential/Platelet  . COMPLETE METABOLIC PANEL WITH GFR  . Pain Mgmt, Profile 5 w/Conf, U   Meds ordered this encounter  Medications  . diclofenac sodium (VOLTAREN) 1 % GEL    Sig: Apply 4 g topically 4 (four) times daily.    Dispense:  3 Tube    Refill:  3    Order Specific Question:   Supervising Provider    Answer:   Bo Merino [[id number]]  . tiZANidine (ZANAFLEX) 4 MG capsule    Sig: Take 1 capsule (4 mg total) by mouth at bedtime as needed for muscle spasms.    Dispense:  90 capsule    Refill:  1    Order Specific Question:   Supervising Provider    Answer:   Bo Merino [[id number]]  . zolpidem (AMBIEN) 10 MG tablet    Sig: Take 1 tablet (10 mg total) by mouth at bedtime.    Dispense:  90 tablet    Refill:  1    Order Specific Question:   Supervising Provider    Answer:   Bo Merino [[id number]]  . DISCONTD: baclofen (LIORESAL) 10 MG tablet    Sig: Take 1 tablet (10 mg total) by mouth 2 (two) times daily.    Dispense:  180 tablet    Refill:  1    Order Specific Question:   Supervising Provider    Answer:   Bo Merino [[id number]]  . baclofen (LIORESAL) 10 MG tablet    Sig: Take 1 tablet (10 mg total) by mouth 2 (two) times daily.    Dispense:  180 tablet    Refill:  1    Order Specific Question:   Supervising Provider    Answer:   Bo Merino [phone]    Face-to-face time spent with patient was 30 minutes. 50% of time was spent in  counseling and coordination of care.  Follow-Up Instructions: Return in about 6 months (around 12/16/2016).   Eliezer Lofts, PA-C  Note - This record has been created using [employer].  Chart creation errors have been sought, but may not always  have been located. Such creation errors do not reflect on  the standard of medical care.

## 2016-06-16 ENCOUNTER — Ambulatory Visit (INDEPENDENT_AMBULATORY_CARE_PROVIDER_SITE_OTHER): Payer: Managed Care, Other (non HMO) | Admitting: Rheumatology

## 2016-06-16 ENCOUNTER — Encounter: Payer: Self-pay | Admitting: Rheumatology

## 2016-06-16 VITALS — BP 148/89 | HR 80 | Resp 15 | Ht 61.0 in | Wt 211.0 lb

## 2016-06-16 DIAGNOSIS — G4730 Sleep apnea, unspecified: Secondary | ICD-10-CM

## 2016-06-16 DIAGNOSIS — G4700 Insomnia, unspecified: Secondary | ICD-10-CM | POA: Diagnosis not present

## 2016-06-16 DIAGNOSIS — G8929 Other chronic pain: Secondary | ICD-10-CM | POA: Diagnosis not present

## 2016-06-16 DIAGNOSIS — M25562 Pain in left knee: Secondary | ICD-10-CM | POA: Diagnosis not present

## 2016-06-16 DIAGNOSIS — M170 Bilateral primary osteoarthritis of knee: Secondary | ICD-10-CM

## 2016-06-16 DIAGNOSIS — R5383 Other fatigue: Secondary | ICD-10-CM

## 2016-06-16 DIAGNOSIS — M1711 Unilateral primary osteoarthritis, right knee: Secondary | ICD-10-CM

## 2016-06-16 DIAGNOSIS — G5602 Carpal tunnel syndrome, left upper limb: Secondary | ICD-10-CM

## 2016-06-16 DIAGNOSIS — R52 Pain, unspecified: Secondary | ICD-10-CM

## 2016-06-16 DIAGNOSIS — M797 Fibromyalgia: Secondary | ICD-10-CM

## 2016-06-16 DIAGNOSIS — G2581 Restless legs syndrome: Secondary | ICD-10-CM

## 2016-06-16 DIAGNOSIS — M8580 Other specified disorders of bone density and structure, unspecified site: Secondary | ICD-10-CM

## 2016-06-16 LAB — CBC WITH DIFFERENTIAL/PLATELET
BASOS ABS: 89 {cells}/uL (ref 0–200)
Basophils Relative: 1 %
EOS PCT: 1 %
Eosinophils Absolute: 89 cells/uL (ref 15–500)
HCT: 44.4 % (ref 35.0–45.0)
Hemoglobin: 14.6 g/dL (ref 11.7–15.5)
Lymphocytes Relative: 23 %
Lymphs Abs: 2047 cells/uL (ref 850–3900)
MCH: 28.1 pg (ref 27.0–33.0)
MCHC: 32.9 g/dL (ref 32.0–36.0)
MCV: 85.5 fL (ref 80.0–100.0)
MONOS PCT: 9 %
MPV: 9.6 fL (ref 7.5–12.5)
Monocytes Absolute: 801 cells/uL (ref 200–950)
Neutro Abs: 5874 cells/uL (ref 1500–7800)
Neutrophils Relative %: 66 %
PLATELETS: 255 10*3/uL (ref 140–400)
RBC: 5.19 MIL/uL — ABNORMAL HIGH (ref 3.80–5.10)
RDW: 15 % (ref 11.0–15.0)
WBC: 8.9 10*3/uL (ref 3.8–10.8)

## 2016-06-16 LAB — COMPLETE METABOLIC PANEL WITH GFR
ALT: 69 U/L — ABNORMAL HIGH (ref 6–29)
AST: 39 U/L — ABNORMAL HIGH (ref 10–35)
Albumin: 4.2 g/dL (ref 3.6–5.1)
Alkaline Phosphatase: 76 U/L (ref 33–130)
BUN: 19 mg/dL (ref 7–25)
CO2: 24 mmol/L (ref 20–31)
Calcium: 9.6 mg/dL (ref 8.6–10.4)
Chloride: 102 mmol/L (ref 98–110)
Creat: 0.89 mg/dL (ref 0.50–1.05)
GFR, EST NON AFRICAN AMERICAN: 71 mL/min (ref >=60)
GFR, Est African American: 82 mL/min (ref >=60)
GLUCOSE: 115 mg/dL — AB (ref 65–99)
POTASSIUM: 4.1 mmol/L (ref 3.5–5.3)
SODIUM: 139 mmol/L (ref 135–146)
TOTAL PROTEIN: 7.1 g/dL (ref 6.1–8.1)
Total Bilirubin: 0.4 mg/dL (ref 0.2–1.2)

## 2016-06-16 MED ORDER — ZOLPIDEM TARTRATE 10 MG PO TABS: 10.0000 mg | ORAL_TABLET | Freq: Every day | ORAL | 1 refills | Status: DC

## 2016-06-16 MED ORDER — BACLOFEN 10 MG PO TABS: 10.0000 mg | ORAL_TABLET | Freq: Two times a day (BID) | ORAL | 1 refills | Status: DC

## 2016-06-16 MED ORDER — BACLOFEN 10 MG PO TABS: 10.0000 mg | ORAL_TABLET | Freq: Two times a day (BID) | ORAL | 1 refills | Status: AC

## 2016-06-16 MED ORDER — TIZANIDINE HCL 4 MG PO CAPS: 4.0000 mg | ORAL_CAPSULE | Freq: Every evening | ORAL | 1 refills | Status: DC | PRN

## 2016-06-16 MED ORDER — DICLOFENAC SODIUM 1 % TD GEL: 4.0000 g | Freq: Four times a day (QID) | TRANSDERMAL | 3 refills | Status: DC

## 2016-06-16 NOTE — Patient Instructions (Signed)
Pt will have separate appt for knee injections once visco is approved  ==================   Knee Exercises Ask your health care provider which exercises are safe for you. Do exercises exactly as told by your health care provider and adjust them as directed. It is normal to feel mild stretching, pulling, tightness, or discomfort as you do these exercises, but you should stop right away if you feel sudden pain or your pain gets worse.Do not begin these exercises until told by your health care provider. STRETCHING AND RANGE OF MOTION EXERCISES  These exercises warm up your muscles and joints and improve the movement and flexibility of your knee. These exercises also help to relieve pain, numbness, and tingling. Exercise A: Knee Extension, Prone  1. Lie on your abdomen on a bed. 2. Place your left / right knee just beyond the edge of the surface so your knee is not on the bed. You can put a towel under your left / right thigh just above your knee for comfort. 3. Relax your leg muscles and allow gravity to straighten your knee. You should feel a stretch behind your left / right knee. 4. Hold this position for __________ seconds. 5. Scoot up so your knee is supported between repetitions. Repeat __________ times. Complete this stretch __________ times a day. Exercise B: Knee Flexion, Active   1. Lie on your back with both knees straight. If this causes back discomfort, bend your left / right knee so your foot is flat on the floor. 2. Slowly slide your left / right heel back toward your buttocks until you feel a gentle stretch in the front of your knee or thigh. 3. Hold this position for __________ seconds. 4. Slowly slide your left / right heel back to the starting position. Repeat __________ times. Complete this exercise __________ times a day. Exercise C: Quadriceps, Prone   1. Lie on your abdomen on a firm surface, such as a bed or padded floor. 2. Bend your left / right knee and hold your  ankle. If you cannot reach your ankle or pant leg, loop a belt around your foot and grab the belt instead. 3. Gently pull your heel toward your buttocks. Your knee should not slide out to the side. You should feel a stretch in the front of your thigh and knee. 4. Hold this position for __________ seconds. Repeat __________ times. Complete this stretch __________ times a day. Exercise D: Hamstring, Supine  1. Lie on your back. 2. Loop a belt or towel over the ball of your left / right foot. The ball of your foot is on the walking surface, right under your toes. 3. Straighten your left / right knee and slowly pull on the belt to raise your leg until you feel a gentle stretch behind your knee.  Do not let your left / right knee bend while you do this.  Keep your other leg flat on the floor. 4. Hold this position for __________ seconds. Repeat __________ times. Complete this stretch __________ times a day. STRENGTHENING EXERCISES  These exercises build strength and endurance in your knee. Endurance is the ability to use your muscles for a long time, even after they get tired. Exercise E: Quadriceps, Isometric   1. Lie on your back with your left / right leg extended and your other knee bent. Put a rolled towel or small pillow under your knee if told by your health care provider. 2. Slowly tense the muscles in the front of  your left / right thigh. You should see your kneecap slide up toward your hip or see increased dimpling just above the knee. This motion will push the back of the knee toward the floor. 3. For __________ seconds, keep the muscle as tight as you can without increasing your pain. 4. Relax the muscles slowly and completely. Repeat __________ times. Complete this exercise __________ times a day. Exercise F: Straight Leg Raises - Quadriceps  1. Lie on your back with your left / right leg extended and your other knee bent. 2. Tense the muscles in the front of your left / right thigh.  You should see your kneecap slide up or see increased dimpling just above the knee. Your thigh may even shake a bit. 3. Keep these muscles tight as you raise your leg 4-6 inches (10-15 cm) off the floor. Do not let your knee bend. 4. Hold this position for __________ seconds. 5. Keep these muscles tense as you lower your leg. 6. Relax your muscles slowly and completely after each repetition. Repeat __________ times. Complete this exercise __________ times a day. Exercise G: Hamstring, Isometric  1. Lie on your back on a firm surface. 2. Bend your left / right knee approximately __________ degrees. 3. Dig your left / right heel into the surface as if you are trying to pull it toward your buttocks. Tighten the muscles in the back of your thighs to dig as hard as you can without increasing any pain. 4. Hold this position for __________ seconds. 5. Release the tension gradually and allow your muscles to relax completely for __________ seconds after each repetition. Repeat __________ times. Complete this exercise __________ times a day. Exercise H: Hamstring Curls   If told by your health care provider, do this exercise while wearing ankle weights. Begin with __________ weights. Then increase the weight by 1 lb (0.5 kg) increments. Do not wear ankle weights that are more than __________. 1. Lie on your abdomen with your legs straight. 2. Bend your left / right knee as far as you can without feeling pain. Keep your hips flat against the floor. 3. Hold this position for __________ seconds. 4. Slowly lower your leg to the starting position. Repeat __________ times. Complete this exercise __________ times a day. Exercise I: Squats (Quadriceps)  1. Stand in front of a table, with your feet and knees pointing straight ahead. You may rest your hands on the table for balance but not for support. 2. Slowly bend your knees and lower your hips like you are going to sit in a chair.  Keep your weight over  your heels, not over your toes.  Keep your lower legs upright so they are parallel with the table legs.  Do not let your hips go lower than your knees.  Do not bend lower than told by your health care provider.  If your knee pain increases, do not bend as low. 3. Hold the squat position for __________ seconds. 4. Slowly push with your legs to return to standing. Do not use your hands to pull yourself to standing. Repeat __________ times. Complete this exercise __________ times a day. Exercise J: Wall Slides (Quadriceps)   1. Lean your back against a smooth wall or door while you walk your feet out 18-24 inches (46-61 cm) from it. 2. Place your feet hip-width apart. 3. Slowly slide down the wall or door until your knees Repeat __________ times. Complete this exercise __________ times a day. 4. Exercise K: Straight  Leg Raises - Hip Abductors  1. Lie on your side with your left / right leg in the top position. Lie so your head, shoulder, knee, and hip line up. You may bend your bottom knee to help you keep your balance. 2. Roll your hips slightly forward so your hips are stacked directly over each other and your left / right knee is facing forward. 3. Leading with your heel, lift your top leg 4-6 inches (10-15 cm). You should feel the muscles in your outer hip lifting.  Do not let your foot drift forward.  Do not let your knee roll toward the ceiling. 4. Hold this position for __________ seconds. 5. Slowly return your leg to the starting position. 6. Let your muscles relax completely after each repetition. Repeat __________ times. Complete this exercise __________ times a day. Exercise L: Straight Leg Raises - Hip Extensors  1. Lie on your abdomen on a firm surface. You can put a pillow under your hips if that is more comfortable. 2. Tense the muscles in your buttocks and lift your left / right leg about 4-6 inches (10-15 cm). Keep your knee straight as you lift your leg. 3. Hold this  position for __________ seconds. 4. Slowly lower your leg to the starting position. 5. Let your leg relax completely after each repetition. Repeat __________ times. Complete this exercise __________ times a day. This information is not intended to replace advice given to you by your health care provider. Make sure you discuss any questions you have with your health care provider. Document Released: 12/24/2004 Document Revised: 11/04/2015 Document Reviewed: 12/16/2014 Elsevier Interactive Patient Education  2017 [employer].

## 2016-06-19 LAB — PAIN MGMT, PROFILE 5 W/CONF, U
ALPHAHYDROXYMIDAZOLAM: NEGATIVE ng/mL (ref <50)
ALPHAHYDROXYTRIAZOLAM: NEGATIVE ng/mL (ref <50)
Alphahydroxyalprazolam: NEGATIVE ng/mL (ref <25)
Aminoclonazepam: NEGATIVE ng/mL (ref <25)
Amphetamines: NEGATIVE ng/mL (ref <500)
BARBITURATES: NEGATIVE ng/mL (ref <300)
Benzodiazepines: NEGATIVE ng/mL (ref <100)
Cocaine Metabolite: NEGATIVE ng/mL (ref <150)
Creatinine: 87.3 mg/dL (ref >=20.0)
Hydroxyethylflurazepam: NEGATIVE ng/mL (ref <50)
Lorazepam: NEGATIVE ng/mL (ref <50)
METHADONE METABOLITE: NEGATIVE ng/mL (ref <100)
Marijuana Metabolite: NEGATIVE ng/mL (ref <20)
NORDIAZEPAM: NEGATIVE ng/mL (ref <50)
OPIATES: NEGATIVE ng/mL (ref <100)
OXAZEPAM: NEGATIVE ng/mL (ref <50)
Oxidant: NEGATIVE ug/mL (ref <200)
Oxycodone: NEGATIVE ng/mL (ref <100)
Temazepam: NEGATIVE ng/mL (ref <50)
pH: 6.76 (ref 4.5–9.0)

## 2016-06-23 ENCOUNTER — Telehealth: Payer: Self-pay | Admitting: Radiology

## 2016-06-23 ENCOUNTER — Other Ambulatory Visit: Payer: Self-pay | Admitting: Radiology

## 2016-06-23 MED ORDER — TRAMADOL HCL 50 MG PO TABS: 50.0000 mg | ORAL_TABLET | Freq: Two times a day (BID) | ORAL | 1 refills | Status: DC | PRN

## 2016-06-23 NOTE — Telephone Encounter (Signed)
I spoke to patient about lab results she states she wants to ck status of the visco injections  I told her this process takes time as we are short staffed. She states if it is after June she will have a deductible.   Can you help her with this situation?  I printed this out for you, to you in epic as well.

## 2016-06-23 NOTE — Telephone Encounter (Addendum)
Called patient. She has requested a tramadol refill, states she requested in the office when you saw her but it did not get sent  I pended it for you, please advise

## 2016-06-23 NOTE — Telephone Encounter (Signed)
-----   Message from [place], [place] sent at 06/22/2016  4:54 PM EDT ----- Send copy of labs to PCP Tell patient #1: Urine drug screen is within normal limits  #2: CMP with GFR is within normal limits except AST is elevated at 39 and ALT is elevated at 69. We will have to monitor this. Patient should repeat CMP with GFR in 2 weeks. (Avoid Tylenol-containing products,, alcohol, other products that can adversely affect her AST and ALT levels.)  #3: CBC with differential is within normal limits

## 2016-07-02 ENCOUNTER — Telehealth: Payer: Self-pay | Admitting: Rheumatology

## 2016-07-02 NOTE — Telephone Encounter (Signed)
Patient left message inquiring about status of knee injection prior approval. Please call to advise.

## 2016-07-07 ENCOUNTER — Telehealth: Payer: Self-pay | Admitting: *Deleted

## 2016-07-07 NOTE — Telephone Encounter (Signed)
[facility]ty pharm called to check on prior authorization for viscosupplementation [phone].

## 2016-07-07 NOTE — Telephone Encounter (Signed)
Please check. Thank you. 

## 2016-07-08 ENCOUNTER — Ambulatory Visit (INDEPENDENT_AMBULATORY_CARE_PROVIDER_SITE_OTHER): Payer: Managed Care, Other (non HMO) | Admitting: Podiatry

## 2016-07-08 ENCOUNTER — Encounter: Payer: Self-pay | Admitting: Podiatry

## 2016-07-08 DIAGNOSIS — M722 Plantar fascial fibromatosis: Secondary | ICD-10-CM

## 2016-07-08 MED ORDER — CELECOXIB 200 MG PO CAPS: 200.0000 mg | ORAL_CAPSULE | Freq: Two times a day (BID) | ORAL | 1 refills | Status: DC

## 2016-07-08 NOTE — Patient Instructions (Signed)

## 2016-07-08 NOTE — Telephone Encounter (Signed)
IC patient and advised I have submitted for benefits for Orthovisc because [employer] does not list Hyalgan as a preferred drug.  I will followup on this one and call her once I know more.

## 2016-07-08 NOTE — Progress Notes (Signed)
She presents today for follow-up of plantar fasciitis to her left heel. She is also here to pick up her orthotics.  Objective: Vital signs are stable she is alert and oriented 3. Pulses are palpable. She's been on palpation medial calcaneal tubercles bilateral.  Assessment: Slowly healing on her fasciitis which seems to be worsening at this point  Plan: I injected the bilateral heels once again today and encouraged her to wear her new orthotics should this not alleviate her symptoms MRI and endoscopic fasciotomy will be necessary.

## 2016-07-10 NOTE — Telephone Encounter (Signed)
Per VOB, injection and admin covered at 100%, needs PA, form faxed to [facility] for PA. Will follow-up.

## 2016-07-14 ENCOUNTER — Telehealth: Payer: Self-pay

## 2016-07-14 NOTE — Telephone Encounter (Signed)
Need to call Janett Billow and advise yes. PA pending, I had made an error on PA and asked for 5 injections vs the protocol 4 injections.  IC Dr Gerilyn Nestle back to advise yes this is for 4 injections only.  Her number from her VM was [id number], if that was clarified she was going to be able to approve these.  Will call [facility] tomorrow.

## 2016-07-14 NOTE — Telephone Encounter (Signed)
Janett Billow called wanting to know if patient prior authorization has been started for patients hyalgan injections. Requested return call at [phone]

## 2016-07-16 NOTE — Telephone Encounter (Signed)
Please call and sched bilateral Orthovisc x 4 for this patient.  Buy and bill, is approved by insurance, see below.  Insurance covers at 100%.  Thanks-  Orthovisc is approved, auth # [id number], valid 07/10/16 through 09/18/16.

## 2016-07-22 ENCOUNTER — Telehealth: Payer: Self-pay | Admitting: Rheumatology

## 2016-07-22 NOTE — Telephone Encounter (Signed)
Patient called to get her Orthovisc injections scheduled.  However, Mr. Carolyn Graham schedule is out until the end of July beginning of August.  She is changing insurance at the end of June and really does not want to pay another deductible.  CB#[phone]

## 2016-07-23 NOTE — Telephone Encounter (Signed)
Approved Dates for injections will expire before patient can be scheduled. Appts are being scheduled for August at this point. Please advise.

## 2016-07-27 NOTE — Telephone Encounter (Signed)
Noted, will f/u

## 2016-07-27 NOTE — Telephone Encounter (Signed)
Can you help me with this one?  Can we do anything to get her in sooner?  I can call insurance to get auth dates amended, but can we do anything to get her in sooner to avoid the deductible/insurance issue?  Please advise, I will do what I can to help.Marland KitchenMarland Kitchen

## 2016-07-28 NOTE — Telephone Encounter (Signed)
Have given to [facility], she will let us know

## 2016-08-04 ENCOUNTER — Telehealth (INDEPENDENT_AMBULATORY_CARE_PROVIDER_SITE_OTHER): Payer: Self-pay | Admitting: *Deleted

## 2016-08-04 NOTE — Telephone Encounter (Signed)
Left message for patient to call to schedule Orthovisc inj x 4 with Gilmer Mor, buy and bill. Must be done ASAP due to insurance auth.

## 2016-08-11 ENCOUNTER — Telehealth: Payer: Self-pay | Admitting: *Deleted

## 2016-08-11 NOTE — Telephone Encounter (Signed)
I called patient again trying to schedule Orthovisc inj. Patient stated her insurance is changing the end of June and she will have to pay out of pocket for Orthovisc until new deductible is met.

## 2016-08-12 NOTE — Telephone Encounter (Signed)
Called patient this morning to schedule her Orthovisc injections, she stated to me that her insurance runs out on June 30th and they are changing to a [employer], so these injections will no longer be covered through her current insurance.  Patient was very disappointed that this did not get handled before June 30th.

## 2016-08-18 ENCOUNTER — Other Ambulatory Visit: Payer: Self-pay | Admitting: Rheumatology

## 2016-08-19 NOTE — Telephone Encounter (Signed)
Last Visit: 06/16/16 Next Visit: 12/17/16  Okay to refill per Dr. Estanislado Pandy

## 2016-08-24 ENCOUNTER — Ambulatory Visit (INDEPENDENT_AMBULATORY_CARE_PROVIDER_SITE_OTHER): Payer: No Typology Code available for payment source | Admitting: Podiatry

## 2016-08-24 ENCOUNTER — Encounter: Payer: Self-pay | Admitting: Podiatry

## 2016-08-24 DIAGNOSIS — M722 Plantar fascial fibromatosis: Secondary | ICD-10-CM

## 2016-08-24 NOTE — Progress Notes (Signed)
She presents today for a follow-up of her bilateral plantar fasciitis. She states that currently they feel almost 100% improved. She continues to wear her orthotics regularly.  Objective: Vital signs are stable alert and oriented 3. Pulses are palpable. No pain on palpation medial calcaneal tubercles bilateral.  Assessment: Pain in limb resolving from plantar fasciitis nearly 100%.  Plan: Discussed appropriate shoe gear and the use of new shoes. Also discussed continued use of orthotics. Follow up with me as needed

## 2016-12-01 ENCOUNTER — Other Ambulatory Visit: Payer: Self-pay | Admitting: Neurological Surgery

## 2016-12-01 DIAGNOSIS — M4316 Spondylolisthesis, lumbar region: Secondary | ICD-10-CM

## 2016-12-04 ENCOUNTER — Ambulatory Visit
Admission: RE | Admit: 2016-12-04 | Discharge: 2016-12-04 | Disposition: A | Discharge status: Home / Self Care | Payer: PRIVATE HEALTH INSURANCE | Source: Ambulatory Visit | Attending: Neurological Surgery | Admitting: Neurological Surgery

## 2016-12-04 DIAGNOSIS — M48061 Spinal stenosis, lumbar region without neurogenic claudication: Secondary | ICD-10-CM | POA: Insufficient documentation

## 2016-12-04 DIAGNOSIS — M4316 Spondylolisthesis, lumbar region: Secondary | ICD-10-CM | POA: Diagnosis present

## 2016-12-05 NOTE — Progress Notes (Signed)
Office Visit Note  Patient: Carolyn Graham             Date of Birth: 04-20-1955           MRN: [id number]             PCP: Juluis Pitch, MD Referring: Juluis Pitch, MD Visit Date: 12/17/2016 Occupation: @GUAROCC @    Subjective:  Pain in multiple joints.   History of Present Illness: Carolyn Graham is a 61 y.o. female with history of fibromyalgia osteoarthritis and disc disease. She states she's been having pain and discomfort in her shoulders elbows, left knee, neck and lower back. She is scheduled to have lumbar spine fusion next month.  Activities of Daily Living:  Patient reports morning stiffness for 30  minutes.   Patient Reports nocturnal pain.  Difficulty dressing/grooming: Denies Difficulty climbing stairs: Reports Difficulty getting out of chair: Reports Difficulty using hands for taps, buttons, cutlery, and/or writing: Denies   Review of Systems  Constitutional: Negative.  Negative for fatigue, night sweats, weight gain, weight loss and weakness.  HENT: Positive for mouth dryness. Negative for mouth sores, trouble swallowing, trouble swallowing and nose dryness.   Eyes: Positive for dryness. Negative for pain, redness and visual disturbance.  Respiratory: Negative.  Negative for cough, shortness of breath and difficulty breathing.   Cardiovascular: Negative.  Negative for chest pain, palpitations, hypertension, irregular heartbeat and swelling in legs/feet.  Gastrointestinal: Positive for constipation. Negative for blood in stool and diarrhea.  Endocrine: Negative.  Negative for increased urination.  Genitourinary: Negative.  Negative for nocturia and vaginal dryness.  Musculoskeletal: Positive for arthralgias, joint pain, myalgias, morning stiffness and myalgias. Negative for joint swelling, muscle weakness and muscle tenderness.  Skin: Negative.  Negative for color change, rash, hair loss, skin tightness, ulcers and sensitivity to sunlight.    Allergic/Immunologic: Negative for susceptible to infections.  Neurological: Negative.  Negative for dizziness, headaches, memory loss and night sweats.  Hematological: Positive for swollen glands.  Psychiatric/Behavioral: Positive for depressed mood and sleep disturbance. The patient is nervous/anxious.     PMFS History:  Patient Active Problem List   Diagnosis Date Noted  . Benign essential hypertension 05/06/2016  . Oral herpes 05/06/2016  . Carpal tunnel syndrome of left wrist 11/08/2015  . Hyperlipidemia, unspecified 05/10/2015  . Osteopenia 11/05/2014  . Closed fracture of fibula 01/09/2014  . Sleep apnea 01/06/2011  . Restless leg syndrome 01/06/2011  . Insomnia 01/06/2011  . Fibromyalgia 01/06/2011  . GERD (gastroesophageal reflux disease) 12/15/2007    Past Medical History:  Diagnosis Date  . Arthritis   . Broken ankle 11/2013   right ankle  . Cataract    Bil/ surgery on right eye  . Fatty liver   . Fibromyalgia 2005  . GERD (gastroesophageal reflux disease)    uses prilosec  . Headache(784.0)    not as often as before  . Hypercholesteremia   . Hypertension   . Hyperthyroidism   . OSA (obstructive sleep apnea)    CPAP  . Plantar fasciitis   . Post-operative nausea and vomiting   . Restless leg syndrome   . Schatzki's ring   . Stroke [facility]) 2015   TIA.  unable to determine if this is true.  . Thyroiditis   . Vertigo     Family History  Problem Relation Age of Onset  . Colon cancer Mother 35  . Lung cancer Father 54  . Colon cancer Maternal Grandfather   . Breast cancer  Neg Hx    Past Surgical History:  Procedure Laterality Date  . CARPAL TUNNEL RELEASE Left 12/18/2015   Procedure: CARPAL TUNNEL RELEASE;  Surgeon: Leanor Kail, MD;  Location: [facility] ORS;  Service: Orthopedics;  Laterality: Left;  . CATARACT EXTRACTION W/PHACO Right 11/13/2015   Procedure: CATARACT EXTRACTION PHACO AND INTRAOCULAR LENS PLACEMENT (IOC);  Surgeon: Estill Cotta,  MD;  Location: [facility] ORS;  Service: Ophthalmology;  Laterality: Right;  [place] 01:08AP% 21.6CDE 26.99Fluid pack lot # [id number]  . CATARACT EXTRACTION W/PHACO Left 12/04/2015   Procedure: CATARACT EXTRACTION PHACO AND INTRAOCULAR LENS PLACEMENT (IOC);  Surgeon: Estill Cotta, MD;  Location: [facility] ORS;  Service: Ophthalmology;  Laterality: Left;  [place] 59.2 AP% 24.4 CDE 29.2 Fluid Pack lot # [id number]  . CERVICAL DISC SURGERY     cervical decompression c4-5 and c5-6  . CTR     right hand  . SHOULDER SURGERY     right shoulder  . TRIGGER FINGER RELEASE     right finger   Social History   Social History Narrative   Married to [place], no natural children   Right handed   Bachelor's degree   2-3 cups daily           Objective: Vital Signs: BP 132/80   Pulse 88   Resp 12   Ht 5\' 1"  (1.549 m)   Wt 213 lb (96.6 kg)   BMI 40.25 kg/m    Physical Exam  Constitutional: She is oriented to person, place, and time. She appears well-developed and well-nourished.  HENT:  Head: Normocephalic and atraumatic.  Eyes: Conjunctivae and EOM are normal.  Neck: Normal range of motion.  Cardiovascular: Normal rate, regular rhythm, normal heart sounds and intact distal pulses.   Pulmonary/Chest: Effort normal and breath sounds normal.  Abdominal: Soft. Bowel sounds are normal.  Lymphadenopathy:    She has no cervical adenopathy.  Neurological: She is alert and oriented to person, place, and time.  Skin: Skin is warm and dry. Capillary refill takes less than 2 seconds.  Psychiatric: She has a normal mood and affect. Her behavior is normal.  Nursing note and vitals reviewed.    Musculoskeletal Exam: C-spine and lumbar spine limited range of motion. Shoulder joints elbow joints wrist joints are good range of motion. She is some DIP PIP thickening in her hands and feet consistent with osteoarthritis. She crepitus in her knee joints without any warmth swelling or effusion. She has some generalized  hyperalgesia and few tender points.  CDAI Exam: No CDAI exam completed.    Investigation: No additional findings. UDS: 05/2016 CBC Latest Ref Rng & Units 06/16/2016 06/16/2013 04/27/2013  WBC 3.8 - 10.8 K/uL 8.9 7.1 7.0  Hemoglobin 11.7 - 15.5 g/dL 14.6 15.7 14.0  Hematocrit 35.0 - 45.0 % 44.4 47.6(H) 40.9  Platelets 140 - 400 K/uL 255 257 222   CMP Latest Ref Rng & Units 06/16/2016 12/09/2015 06/16/2013  Glucose 65 - 99 mg/dL 115(H) - 103  BUN 7 - 25 mg/dL 19 - 17.2  Creatinine 0.50 - 1.05 mg/dL 0.89 - 0.9  Sodium 135 - 146 mmol/L 139 - 142  Potassium 3.5 - 5.3 mmol/L 4.1 3.7 3.5  Chloride 98 - 110 mmol/L 102 - -  CO2 20 - 31 mmol/L 24 - 27  Calcium 8.6 - 10.4 mg/dL 9.6 - 10.1  Total Protein 6.1 - 8.1 g/dL 7.1 - 7.9  Total Bilirubin 0.2 - 1.2 mg/dL 0.4 - 0.48  Alkaline Phos 33 - 130 U/L 76 -  99  AST 10 - 35 U/L 39(H) - 22  ALT 6 - 29 U/L 69(H) - 31    Imaging: Mr Lumbar Spine Wo Contrast  Result Date: 12/04/2016 CLINICAL DATA:  61 year old female status post fall down stairs 1 month ago. Lumbar back pain radiating to both legs with numbness. EXAM: MRI LUMBAR SPINE WITHOUT CONTRAST TECHNIQUE: Multiplanar, multisequence MR imaging of the lumbar spine was performed. No intravenous contrast was administered. COMPARISON:  Lumbar radiographs 11/25/2016.  Lumbar MRI 12/04/2014. FINDINGS: Segmentation: Normal as demonstrated on the comparison radiographs, and this is the same numbering system used on the 2016 MRI. Alignment: Significant progression of anterolisthesis at L3-L4 since 2016 measuring 5-6 mm. Significant progression of disc and endplate degeneration also at that level. Underlying chronic straightening of lumbar lordosis. Mild levoconvex lumbar curvature. Vertebrae: Degenerative marrow edema at the anterior and left lateral L3-L4 endplates. Underlying bone marrow signal is normal. No other marrow edema or acute osseous abnormality. Intact visible sacrum and SI joints. Conus medullaris:  Extends to the L1-L2 level and appears normal. Paraspinal and other soft tissues: Negative. Disc levels: T11-T12:  Negative. T12-L1:  Negative. L1-L2: Minimal disc bulge. Mild epidural lipomatosis. No stenosis. L2-L3: Chronic disc desiccation and chronic but increased broad-based posterior disc protrusion. Chronic epidural lipomatosis. Moderate facet and ligament flavum hypertrophy is mildly progressed. Progressed moderate to severe spinal stenosis (series 5, image 17). No foraminal stenosis. L3-L4: Progressed and now severe disc space loss. Increased circumferential disc bulge/ pseudo disc and endplate spurring. Severe facet and ligament flavum hypertrophy is mildly increased. Increased epidural lipomatosis. Increased and now severe spinal and left lateral recess stenosis. Mild bilateral L3 foraminal stenosis. L4-L5: Mildly progressed chronic right eccentric circumferential disc bulge and endplate spurring. Stable mild to moderate facet and ligament flavum hypertrophy and mild epidural lipomatosis. Stable borderline to mild spinal stenosis. Up to moderate right L4 foraminal stenosis has not significantly changed. L5-S1:  Chronic epidural lipomatosis.  Otherwise negative. IMPRESSION: 1. Grade 1 spondylolisthesis at L3-L4 along with disc and endplate degeneration has significantly progressed since 2016. Increased and now severe spinal and left lateral recess stenosis at that level. Acute endplate marrow edema. 2. Progressed chronic multifactorial spinal stenosis also at L2-L3, moderate to severe. 3. Lesser degenerative changes at L4-L5 are stable with up to mild spinal and moderate right foraminal stenosis. Electronically Signed   By: Genevie Ann M.D.   On: 12/04/2016 19:29    Speciality Comments: No specialty comments available.    Procedures:  No procedures performed Allergies: Codeine; Codeine sulfate; and Darvocet [propoxyphene n-acetaminophen]   Assessment / Plan:     Visit Diagnoses: Fibromyalgia - she  continues to have generalized pain and discomfort and positive tender points. She states she does quite well on the current combination of medications. She's taking Cymbalta, Tizanidine, Mobic to relieve pain. Per her request prescriptions will be refilled today. As she is taking long-term available check her labs today.  Primary osteoarthritis of both hands: Joint protection and muscle strengthening discussed.  Primary osteoarthritis of both knees: Weight loss and knee joint exercises were discussed.  DDD (degenerative disc disease), cervical - S/P fusion: Limited range of motion.  DDD (degenerative disc disease), lumbar: She's been having increased lower back pain and will be getting lumbar spine fusion next month.  Fatty liver  History of insomnia - on Ambien which helps her to sleep at night.  Pain management - UDS: 05/2016, Tramadol 50 mg 1-2 tabs po bid prn. Patient states she's unable  to function without tramadol. Prescription refill was given today.  History of migraine  History of hypertension: Her blood pressure is fairly well controlled.  Other medical problems are listed as follows:  History of gastroesophageal reflux (GERD)  RLS (restless legs syndrome)  History of sleep apnea - CPAP  History of vitamin D deficiency  History of TIA (transient ischemic attack) - questionable, 2015.  History of shoulder surgery - right   Medication monitoring encounter - Plan: CBC with Differential/Platelet, COMPLETE METABOLIC PANEL WITH GFR    Orders: Orders Placed This Encounter  Procedures  . CBC with Differential/Platelet  . COMPLETE METABOLIC PANEL WITH GFR   Meds ordered this encounter  Medications  . zolpidem (AMBIEN) 10 MG tablet    Sig: Take 1 tablet (10 mg total) by mouth at bedtime.    Dispense:  90 tablet    Refill:  1  . traMADol (ULTRAM) 50 MG tablet    Sig: Take 1-2 tablets (50-100 mg total) by mouth 2 (two) times daily as needed.    Dispense:  120 tablet     Refill:  1  . tiZANidine (ZANAFLEX) 4 MG capsule    Sig: Take 1 capsule (4 mg total) by mouth at bedtime as needed for muscle spasms.    Dispense:  90 capsule    Refill:  1  . meloxicam (MOBIC) 15 MG tablet    Sig: TAKE 1 TABLET BY MOUTH EVERY DAY AS NEEDED FOR PAIN    Dispense:  90 tablet    Refill:  1  . DULoxetine (CYMBALTA) 30 MG capsule    Sig: Take 1 capsule (30 mg total) by mouth daily.    Dispense:  90 capsule    Refill:  1    Face-to-face time spent with patient was 30 minutes. 50% of time was spent in counseling and coordination of care.  Follow-Up Instructions: Return in about 6 months (around 06/17/2017) for FMS OA DDD.   Bo Merino, MD  Note - This record has been created using Editor, commissioning.  Chart creation errors have been sought, but may not always  have been located. Such creation errors do not reflect on  the standard of medical care.

## 2016-12-10 ENCOUNTER — Other Ambulatory Visit: Payer: Self-pay | Admitting: Neurological Surgery

## 2016-12-10 ENCOUNTER — Other Ambulatory Visit: Payer: Self-pay | Admitting: Family Medicine

## 2016-12-10 DIAGNOSIS — Z1231 Encounter for screening mammogram for malignant neoplasm of breast: Secondary | ICD-10-CM

## 2016-12-17 ENCOUNTER — Ambulatory Visit: Payer: Managed Care, Other (non HMO) | Admitting: Rheumatology

## 2016-12-17 ENCOUNTER — Encounter: Payer: Self-pay | Admitting: Rheumatology

## 2016-12-17 ENCOUNTER — Ambulatory Visit (INDEPENDENT_AMBULATORY_CARE_PROVIDER_SITE_OTHER): Payer: No Typology Code available for payment source | Admitting: Rheumatology

## 2016-12-17 VITALS — BP 132/80 | HR 88 | Resp 12 | Ht 61.0 in | Wt 213.0 lb

## 2016-12-17 DIAGNOSIS — G2581 Restless legs syndrome: Secondary | ICD-10-CM | POA: Diagnosis not present

## 2016-12-17 DIAGNOSIS — R52 Pain, unspecified: Secondary | ICD-10-CM

## 2016-12-17 DIAGNOSIS — Z8719 Personal history of other diseases of the digestive system: Secondary | ICD-10-CM

## 2016-12-17 DIAGNOSIS — Z8673 Personal history of transient ischemic attack (TIA), and cerebral infarction without residual deficits: Secondary | ICD-10-CM

## 2016-12-17 DIAGNOSIS — Z8639 Personal history of other endocrine, nutritional and metabolic disease: Secondary | ICD-10-CM

## 2016-12-17 DIAGNOSIS — Z8669 Personal history of other diseases of the nervous system and sense organs: Secondary | ICD-10-CM | POA: Diagnosis not present

## 2016-12-17 DIAGNOSIS — Z9889 Other specified postprocedural states: Secondary | ICD-10-CM

## 2016-12-17 DIAGNOSIS — M5030 Other cervical disc degeneration, unspecified cervical region: Secondary | ICD-10-CM | POA: Diagnosis not present

## 2016-12-17 DIAGNOSIS — Z5181 Encounter for therapeutic drug level monitoring: Secondary | ICD-10-CM

## 2016-12-17 DIAGNOSIS — M170 Bilateral primary osteoarthritis of knee: Secondary | ICD-10-CM | POA: Diagnosis not present

## 2016-12-17 DIAGNOSIS — Z87898 Personal history of other specified conditions: Secondary | ICD-10-CM | POA: Diagnosis not present

## 2016-12-17 DIAGNOSIS — M19042 Primary osteoarthritis, left hand: Secondary | ICD-10-CM

## 2016-12-17 DIAGNOSIS — M19041 Primary osteoarthritis, right hand: Secondary | ICD-10-CM | POA: Diagnosis not present

## 2016-12-17 DIAGNOSIS — M797 Fibromyalgia: Secondary | ICD-10-CM

## 2016-12-17 DIAGNOSIS — K760 Fatty (change of) liver, not elsewhere classified: Secondary | ICD-10-CM

## 2016-12-17 DIAGNOSIS — M5136 Other intervertebral disc degeneration, lumbar region: Secondary | ICD-10-CM

## 2016-12-17 DIAGNOSIS — Z8679 Personal history of other diseases of the circulatory system: Secondary | ICD-10-CM

## 2016-12-17 DIAGNOSIS — M51369 Other intervertebral disc degeneration, lumbar region without mention of lumbar back pain or lower extremity pain: Secondary | ICD-10-CM

## 2016-12-17 LAB — COMPLETE METABOLIC PANEL WITH GFR
AG RATIO: 1.5 (calc) (ref 1.0–2.5)
ALBUMIN MSPROF: 4.1 g/dL (ref 3.6–5.1)
ALKALINE PHOSPHATASE (APISO): 61 U/L (ref 33–130)
ALT: 38 U/L — ABNORMAL HIGH (ref 6–29)
AST: 29 U/L (ref 10–35)
BILIRUBIN TOTAL: 0.4 mg/dL (ref 0.2–1.2)
BUN: 16 mg/dL (ref 7–25)
CHLORIDE: 104 mmol/L (ref 98–110)
CO2: 27 mmol/L (ref 20–32)
Calcium: 9 mg/dL (ref 8.6–10.4)
Creat: 0.82 mg/dL (ref 0.50–1.05)
GFR, EST AFRICAN AMERICAN: 91 mL/min/{1.73_m2} (ref >=60)
GFR, Est Non African American: 78 mL/min/{1.73_m2} (ref >=60)
GLOBULIN: 2.7 g/dL (ref 1.9–3.7)
GLUCOSE: 92 mg/dL (ref 65–99)
Potassium: 4.5 mmol/L (ref 3.5–5.3)
SODIUM: 140 mmol/L (ref 135–146)
Total Protein: 6.8 g/dL (ref 6.1–8.1)

## 2016-12-17 LAB — CBC WITH DIFFERENTIAL/PLATELET
BASOS ABS: 113 {cells}/uL (ref 0–200)
Basophils Relative: 1.8 %
EOS ABS: 233 {cells}/uL (ref 15–500)
Eosinophils Relative: 3.7 %
HCT: 42.2 % (ref 35.0–45.0)
HEMOGLOBIN: 14.1 g/dL (ref 11.7–15.5)
Lymphs Abs: 1802 cells/uL (ref 850–3900)
MCH: 28.6 pg (ref 27.0–33.0)
MCHC: 33.4 g/dL (ref 32.0–36.0)
MCV: 85.6 fL (ref 80.0–100.0)
MONOS PCT: 11.2 %
MPV: 9.9 fL (ref 7.5–12.5)
NEUTROS ABS: 3446 {cells}/uL (ref 1500–7800)
Neutrophils Relative %: 54.7 %
Platelets: 224 10*3/uL (ref 140–400)
RBC: 4.93 10*6/uL (ref 3.80–5.10)
RDW: 13.3 % (ref 11.0–15.0)
Total Lymphocyte: 28.6 %
WBC: 6.3 10*3/uL (ref 3.8–10.8)
WBCMIX: 706 {cells}/uL (ref 200–950)

## 2016-12-17 MED ORDER — TIZANIDINE HCL 4 MG PO CAPS: 4.0000 mg | ORAL_CAPSULE | Freq: Every evening | ORAL | 1 refills | Status: DC | PRN

## 2016-12-17 MED ORDER — TRAMADOL HCL 50 MG PO TABS: 50.0000 mg | ORAL_TABLET | Freq: Two times a day (BID) | ORAL | 1 refills | Status: DC | PRN

## 2016-12-17 MED ORDER — ZOLPIDEM TARTRATE 10 MG PO TABS: 10.0000 mg | ORAL_TABLET | Freq: Every day | ORAL | 1 refills | Status: DC

## 2016-12-17 MED ORDER — DULOXETINE HCL 30 MG PO CPEP: 30.0000 mg | ORAL_CAPSULE | Freq: Every day | ORAL | 1 refills | Status: DC

## 2016-12-17 MED ORDER — MELOXICAM 15 MG PO TABS: ORAL_TABLET | ORAL | 1 refills | Status: DC

## 2016-12-18 NOTE — Progress Notes (Signed)
Her LFTs are mildly elevated most likely related to Mobic use. She may reduce the tablet to half tablet by mouth daily.

## 2016-12-24 ENCOUNTER — Telehealth: Payer: Self-pay

## 2016-12-24 NOTE — Telephone Encounter (Signed)
Received a confirmation from  regarding a prior authorization approval for Tramadol from 12/24/2016 to 06/23/2017.   Reference number: [id number] Phone number: [phone]  Will send document to scan center.  Called patient to update. She voices understanding. She mentioned that her Zolpidem Rx was sent to the pharmacy on the same day a tramadol but was filled incorrectly. She states that she normally gets a 3 month supply. The Rx was filled as a one month supply with one refill. I verified that the Rx was submitted for 3 months with 1 refill. Her insurance did not allow for a 90 day supply at a time. She plans to contact her insurance company and the pharmacy for clarification.    Sakai Wolford, [place], CPhT 1:42 PM

## 2016-12-24 NOTE — Telephone Encounter (Signed)
Received a prior authorization request via cover my meds from [facility]. Authorization has been submitted. Will update once we receive a response.   Ermin Parisien, [place], CPhT 12:18 PM

## 2016-12-29 ENCOUNTER — Ambulatory Visit
Admission: RE | Admit: 2016-12-29 | Discharge: 2016-12-29 | Disposition: A | Discharge status: Home / Self Care | Payer: PRIVATE HEALTH INSURANCE | Source: Ambulatory Visit | Attending: Family Medicine | Admitting: Family Medicine

## 2016-12-29 DIAGNOSIS — Z1231 Encounter for screening mammogram for malignant neoplasm of breast: Secondary | ICD-10-CM | POA: Diagnosis not present

## 2017-01-06 NOTE — Pre-Procedure Instructions (Signed)
Carolyn Graham  01/06/2017      [id number] [place], [place] AT [address] [place] [address] Phone: [phone] Fax: [phone]  [facility] [place], [place] - [address] AT [address] [place] [address] Phone: [phone] Fax: [phone]    Your procedure is scheduled on Nov 20  Report to [place] at 530 A.M.  Call this number if you have problems the morning of surgery:  [phone]   Remember:  Do not eat food or drink liquids after midnight.  Take these medicines the morning of surgery with A SIP OF WATER Duloxetine (Cymbalta), Omeprazole (Prilosec), Imitrex if needed, Tramadol (ultram) if needed, Bisoprolol- Hydrochlorothiazide (Ziac)   Stop taking  BC's, Goody's, Herbal medications, Fish Oil, Ibuprofen, Advil, Motrin, Vitamins, Meloxicam (Mobic)  Stop taking aspirin as directed by your Dr.    Lazaro Arms not wear jewelry, make-up or nail polish.  Do not wear lotions, powders, or perfumes, or deoderant.  Do not shave 48 hours prior to surgery.  Men may shave face and neck.  Do not bring valuables to the hospital.  [facility] is not responsible for any belongings or valuables.  Contacts, dentures or bridgework may not be worn into surgery.  Leave your suitcase in the car.  After surgery it may be brought to your room.  For patients admitted to the hospital, discharge time will be determined by your treatment team.  Patients discharged the day of surgery will not be allowed to drive home.   Special instructions:  [place] - Preparing for Surgery  Before surgery, you can play an important role.  Because skin is not sterile, your skin needs to be as free of germs as possible.  You can reduce the number of germs on you skin by washing with CHG (chlorahexidine gluconate) soap before surgery.  CHG is an antiseptic  cleaner which kills germs and bonds with the skin to continue killing germs even after washing.  Please DO NOT use if you have an allergy to CHG or antibacterial soaps.  If your skin becomes reddened/irritated stop using the CHG and inform your nurse when you arrive at Short Stay.  Do not shave (including legs and underarms) for at least 48 hours prior to the first CHG shower.  You may shave your face.  Please follow these instructions carefully:   1.  Shower with CHG Soap the night before surgery and the   morning of Surgery.  2.  If you choose to wash your hair, wash your hair first as usual with your  normal shampoo.  3.  After you shampoo, rinse your hair and body thoroughly to remove the  Shampoo.  4.  Use CHG as you would any other liquid soap.  You can apply chg directly  to the skin and wash gently with scrungie or a clean washcloth.  5.  Apply the CHG Soap to your body ONLY FROM THE NECK DOWN.     Do not use on open wounds or open sores.  Avoid contact with your eyes,  ears, mouth and genitals (private parts).  Wash genitals (private parts)   with your normal soap.  6.  Wash thoroughly, paying special attention to the area where your surgery  will be performed.  7.  Thoroughly rinse your  body with warm water from the neck down.  8.  DO NOT shower/wash with your normal soap after using and rinsing off the CHG Soap.  9.  Pat yourself dry with a clean towel.            10.  Wear clean pajamas.            11.  Place clean sheets on your bed the night of your first shower and do not  sleep with pets.  Day of Surgery  Do not apply any lotions/deoderants the morning of surgery.  Please wear clean clothes to the hospital/surgery center.     Please read over the following fact sheets that you were given. Pain Booklet, Coughing and Deep Breathing, MRSA Information and Surgical Site Infection Prevention

## 2017-01-07 ENCOUNTER — Encounter (HOSPITAL_COMMUNITY): Payer: Self-pay

## 2017-01-07 ENCOUNTER — Other Ambulatory Visit: Payer: Self-pay

## 2017-01-07 ENCOUNTER — Encounter (HOSPITAL_COMMUNITY)
Admission: RE | Admit: 2017-01-07 | Discharge: 2017-01-07 | Disposition: A | Discharge status: Home / Self Care | Payer: PRIVATE HEALTH INSURANCE | Source: Ambulatory Visit | Attending: Neurological Surgery | Admitting: Neurological Surgery

## 2017-01-07 DIAGNOSIS — Z01810 Encounter for preprocedural cardiovascular examination: Secondary | ICD-10-CM | POA: Insufficient documentation

## 2017-01-07 DIAGNOSIS — Z01812 Encounter for preprocedural laboratory examination: Secondary | ICD-10-CM | POA: Diagnosis present

## 2017-01-07 HISTORY — DX: Spondylolisthesis, lumbar region: M43.16

## 2017-01-07 HISTORY — DX: Stress incontinence (female) (male): N39.3

## 2017-01-07 LAB — ABO/RH: ABO/RH(D): A NEG

## 2017-01-07 LAB — TYPE AND SCREEN
ABO/RH(D): A NEG
Antibody Screen: NEGATIVE

## 2017-01-07 LAB — CBC
HCT: 45 % (ref 36.0–46.0)
Hemoglobin: 15 g/dL (ref 12.0–15.0)
MCH: 29.2 pg (ref 26.0–34.0)
MCHC: 33.3 g/dL (ref 30.0–36.0)
MCV: 87.7 fL (ref 78.0–100.0)
PLATELETS: 245 10*3/uL (ref 150–400)
RBC: 5.13 MIL/uL — ABNORMAL HIGH (ref 3.87–5.11)
RDW: 14.4 % (ref 11.5–15.5)
WBC: 8 10*3/uL (ref 4.0–10.5)

## 2017-01-07 LAB — COMPREHENSIVE METABOLIC PANEL
ALK PHOS: 70 U/L (ref 38–126)
ALT: 45 U/L (ref 14–54)
AST: 37 U/L (ref 15–41)
Albumin: 4 g/dL (ref 3.5–5.0)
Anion gap: 7 (ref 5–15)
BUN: 15 mg/dL (ref 6–20)
CALCIUM: 9.4 mg/dL (ref 8.9–10.3)
CHLORIDE: 106 mmol/L (ref 101–111)
CO2: 28 mmol/L (ref 22–32)
CREATININE: 0.91 mg/dL (ref 0.44–1.00)
Glucose, Bld: 100 mg/dL — ABNORMAL HIGH (ref 65–99)
Potassium: 4 mmol/L (ref 3.5–5.1)
SODIUM: 141 mmol/L (ref 135–145)
Total Bilirubin: 0.6 mg/dL (ref 0.3–1.2)
Total Protein: 7.3 g/dL (ref 6.5–8.1)

## 2017-01-07 LAB — SURGICAL PCR SCREEN
MRSA, PCR: NEGATIVE
Staphylococcus aureus: POSITIVE — AB

## 2017-01-07 NOTE — Pre-Procedure Instructions (Signed)
ARITHA HUCKEBA  01/07/2017      [facility] [place], [place] AT [address] & [place] [address] Phone: [phone] Fax: [phone]  [facility] [place], [place] - [address] AT [address] [place] [address] Phone: [phone] Fax: [phone]    Your procedure is scheduled on Tuesday, January 12, 2017  Report to [facility] Admitting at 5:30 A.M.  Call this number if you have problems the morning of surgery:  [phone]   Remember:  Do not eat food or drink liquids after midnight Monday, Nov. 19  Take these medicines the morning of surgery with A SIP OF WATER : bisoprolol-hydrochlorothiazide (ZIAC), DULoxetine (CYMBALTA), omeprazole (PRILOSEC), if needed: SUMAtriptan (IMITREX) for headache. traMADol (ULTRAM) for pain, valACYclovir (VALTREX) for fever blisters Stop taking Aspirin, vitamins, fish oil and herbal medications. Do not take any NSAIDs ie: Ibuprofen, Advil, Naproxen (Aleve), Motrin, meloxicam (MOBIC),diclofenac sodium (VOLTAREN) 1 % GEL  or any medication containing Aspirin; stop now.   Do not wear jewelry, make-up or nail polish.  Do not wear lotions, powders, or perfumes, or deodorant.  Do not shave 48 hours prior to surgery.    Do not bring valuables to the hospital.  [facility] is not responsible for any belongings or valuables.  Contacts, dentures or bridgework may not be worn into surgery.  Leave your suitcase in the car.  After surgery it may be brought to your room. For patients admitted to the hospital, discharge time will be determined by your treatment team. Patients discharged the day of surgery will not be allowed to drive home.  Special instructions: [place] - Preparing for Surgery  Before surgery, you can play an important role.  Because skin is not sterile, your skin needs to be as free of germs as  possible.  You can reduce the number of germs on you skin by washing with CHG (chlorahexidine gluconate) soap before surgery.  CHG is an antiseptic cleaner which kills germs and bonds with the skin to continue killing germs even after washing.  Please DO NOT use if you have an allergy to CHG or antibacterial soaps.  If your skin becomes reddened/irritated stop using the CHG and inform your nurse when you arrive at Short Stay.  Do not shave (including legs and underarms) for at least 48 hours prior to the first CHG shower.  You may shave your face.  Please follow these instructions carefully:   1.  Shower with CHG Soap the night before surgery and the morning of Surgery.  2.  If you choose to wash your hair, wash your hair first as usual with your normal shampoo.  3.  After you shampoo, rinse your hair and body thoroughly to remove the Shampoo.  4.  Use CHG as you would any other liquid soap.  You can apply chg directly  to the skin and wash gently with scrungie or a clean washcloth.  5.  Apply the CHG Soap to your body ONLY FROM THE NECK DOWN.  Do not use on open wounds or open sores.  Avoid contact with your eyes, ears, mouth and genitals (private parts).  Wash genitals (private parts) with your normal soap.  6.  Wash thoroughly, paying special attention to the area where your surgery will be performed.  7.  Thoroughly rinse  your body with warm water from the neck down.  8.  DO NOT shower/wash with your normal soap after using and rinsing off the CHG Soap.  9.  Pat yourself dry with a clean towel.            10.  Wear clean pajamas.            11.  Place clean sheets on your bed the night of your first shower and do not sleep with pets.  Day of Surgery  Do not apply any lotions/deodorants the morning of surgery.  Please wear clean clothes to the hospital/surgery center.  Please read over the following fact sheets that you were given. Pain Booklet, Coughing and Deep Breathing, Blood  Transfusion Information, MRSA Information and Surgical Site Infection Prevention

## 2017-01-07 NOTE — Progress Notes (Signed)
Pt denies SOB, chest pain, and being under the care of a cardiologist. Pt denies having a stress test, echo and cardiac cath. Pt denies having an EKG and chest x ray within the last year. Pt denies recent labs.  

## 2017-01-12 ENCOUNTER — Inpatient Hospital Stay (HOSPITAL_COMMUNITY): Payer: PRIVATE HEALTH INSURANCE

## 2017-01-12 ENCOUNTER — Encounter (HOSPITAL_COMMUNITY)
Admission: RE | Disposition: A | Discharge status: Home / Self Care | Payer: Self-pay | Source: Ambulatory Visit | Attending: Neurological Surgery

## 2017-01-12 ENCOUNTER — Inpatient Hospital Stay (HOSPITAL_COMMUNITY): Payer: PRIVATE HEALTH INSURANCE | Admitting: Anesthesiology

## 2017-01-12 ENCOUNTER — Other Ambulatory Visit: Payer: Self-pay

## 2017-01-12 ENCOUNTER — Inpatient Hospital Stay (HOSPITAL_COMMUNITY)
Admission: RE | Admit: 2017-01-12 | Discharge: 2017-01-14 | DRG: 454 | Disposition: A | Discharge status: Home / Self Care | Payer: PRIVATE HEALTH INSURANCE | Source: Ambulatory Visit | Attending: Neurological Surgery | Admitting: Neurological Surgery

## 2017-01-12 ENCOUNTER — Encounter (HOSPITAL_COMMUNITY): Payer: Self-pay | Admitting: *Deleted

## 2017-01-12 DIAGNOSIS — G8929 Other chronic pain: Secondary | ICD-10-CM | POA: Diagnosis present

## 2017-01-12 DIAGNOSIS — M5116 Intervertebral disc disorders with radiculopathy, lumbar region: Secondary | ICD-10-CM | POA: Diagnosis present

## 2017-01-12 DIAGNOSIS — M48061 Spinal stenosis, lumbar region without neurogenic claudication: Secondary | ICD-10-CM | POA: Diagnosis present

## 2017-01-12 DIAGNOSIS — E0590 Thyrotoxicosis, unspecified without thyrotoxic crisis or storm: Secondary | ICD-10-CM | POA: Diagnosis present

## 2017-01-12 DIAGNOSIS — M797 Fibromyalgia: Secondary | ICD-10-CM | POA: Diagnosis present

## 2017-01-12 DIAGNOSIS — M4316 Spondylolisthesis, lumbar region: Secondary | ICD-10-CM | POA: Diagnosis present

## 2017-01-12 DIAGNOSIS — G4730 Sleep apnea, unspecified: Secondary | ICD-10-CM | POA: Diagnosis present

## 2017-01-12 DIAGNOSIS — Z419 Encounter for procedure for purposes other than remedying health state, unspecified: Secondary | ICD-10-CM

## 2017-01-12 DIAGNOSIS — Z6839 Body mass index (BMI) 39.0-39.9, adult: Secondary | ICD-10-CM | POA: Diagnosis not present

## 2017-01-12 DIAGNOSIS — K219 Gastro-esophageal reflux disease without esophagitis: Secondary | ICD-10-CM | POA: Diagnosis present

## 2017-01-12 DIAGNOSIS — I10 Essential (primary) hypertension: Secondary | ICD-10-CM | POA: Diagnosis present

## 2017-01-12 DIAGNOSIS — D62 Acute posthemorrhagic anemia: Secondary | ICD-10-CM | POA: Diagnosis not present

## 2017-01-12 SURGERY — POSTERIOR LUMBAR FUSION 2 LEVEL
Anesthesia: General | Site: Spine Lumbar

## 2017-01-12 MED ORDER — MIDAZOLAM HCL 2 MG/2ML IJ SOLN
INTRAMUSCULAR | Status: AC
  Filled 2017-01-12: qty 2

## 2017-01-12 MED ORDER — ROSUVASTATIN CALCIUM 10 MG PO TABS
10.0000 mg | ORAL_TABLET | Freq: Every day | ORAL | Status: DC
  Administered 2017-01-12 – 2017-01-13 (×2): 10 mg via ORAL
  Filled 2017-01-12 (×2): qty 1

## 2017-01-12 MED ORDER — THROMBIN (RECOMBINANT) 5000 UNITS EX SOLR
CUTANEOUS | Status: DC | PRN
  Administered 2017-01-12 (×2): 5000 [IU] via TOPICAL

## 2017-01-12 MED ORDER — CHLORHEXIDINE GLUCONATE CLOTH 2 % EX PADS: 6.0000 | MEDICATED_PAD | Freq: Once | CUTANEOUS | Status: DC

## 2017-01-12 MED ORDER — PHENOL 1.4 % MT LIQD: 1.0000 | OROMUCOSAL | Status: DC | PRN

## 2017-01-12 MED ORDER — DOCUSATE SODIUM 100 MG PO CAPS
100.0000 mg | ORAL_CAPSULE | Freq: Two times a day (BID) | ORAL | Status: DC
  Administered 2017-01-12 – 2017-01-14 (×5): 100 mg via ORAL
  Filled 2017-01-12 (×5): qty 1

## 2017-01-12 MED ORDER — LIDOCAINE 2% (20 MG/ML) 5 ML SYRINGE
INTRAMUSCULAR | Status: DC | PRN
  Administered 2017-01-12: 80 mg via INTRAVENOUS

## 2017-01-12 MED ORDER — METHOCARBAMOL 500 MG PO TABS
500.0000 mg | ORAL_TABLET | Freq: Four times a day (QID) | ORAL | Status: DC | PRN
  Administered 2017-01-12 – 2017-01-14 (×7): 500 mg via ORAL
  Filled 2017-01-12 (×6): qty 1

## 2017-01-12 MED ORDER — ONDANSETRON HCL 4 MG/2ML IJ SOLN: 4.0000 mg | Freq: Four times a day (QID) | INTRAMUSCULAR | Status: DC | PRN

## 2017-01-12 MED ORDER — ROCURONIUM BROMIDE 10 MG/ML (PF) SYRINGE
PREFILLED_SYRINGE | INTRAVENOUS | Status: DC | PRN
  Administered 2017-01-12: 10 mg via INTRAVENOUS
  Administered 2017-01-12: 50 mg via INTRAVENOUS
  Administered 2017-01-12 (×4): 10 mg via INTRAVENOUS

## 2017-01-12 MED ORDER — DULOXETINE HCL 30 MG PO CPEP
30.0000 mg | ORAL_CAPSULE | Freq: Every day | ORAL | Status: DC
  Administered 2017-01-13 – 2017-01-14 (×2): 30 mg via ORAL
  Filled 2017-01-12 (×2): qty 1

## 2017-01-12 MED ORDER — BUPIVACAINE HCL (PF) 0.5 % IJ SOLN
INTRAMUSCULAR | Status: DC | PRN
  Administered 2017-01-12: 20 mL
  Administered 2017-01-12: 5 mL

## 2017-01-12 MED ORDER — CEFAZOLIN SODIUM 1 G IJ SOLR
INTRAMUSCULAR | Status: AC
  Filled 2017-01-12: qty 20

## 2017-01-12 MED ORDER — ONDANSETRON HCL 4 MG PO TABS: 4.0000 mg | ORAL_TABLET | Freq: Four times a day (QID) | ORAL | Status: DC | PRN

## 2017-01-12 MED ORDER — SODIUM CHLORIDE 0.9% FLUSH: 3.0000 mL | INTRAVENOUS | Status: DC | PRN

## 2017-01-12 MED ORDER — EPHEDRINE SULFATE-NACL 50-0.9 MG/10ML-% IV SOSY
PREFILLED_SYRINGE | INTRAVENOUS | Status: DC | PRN
  Administered 2017-01-12 (×3): 10 mg via INTRAVENOUS

## 2017-01-12 MED ORDER — SCOPOLAMINE 1 MG/3DAYS TD PT72
1.0000 | MEDICATED_PATCH | TRANSDERMAL | Status: DC
  Administered 2017-01-12: 1.5 mg via TRANSDERMAL
  Filled 2017-01-12: qty 1

## 2017-01-12 MED ORDER — PHENYLEPHRINE HCL 10 MG/ML IJ SOLN
INTRAVENOUS | Status: DC | PRN
  Administered 2017-01-12: 20 ug/min via INTRAVENOUS

## 2017-01-12 MED ORDER — MIDAZOLAM HCL 5 MG/5ML IJ SOLN
INTRAMUSCULAR | Status: DC | PRN
  Administered 2017-01-12: 2 mg via INTRAVENOUS

## 2017-01-12 MED ORDER — THROMBIN (RECOMBINANT) 20000 UNITS EX SOLR
CUTANEOUS | Status: DC | PRN
  Administered 2017-01-12: 20000 [IU] via TOPICAL

## 2017-01-12 MED ORDER — PHENYLEPHRINE 40 MCG/ML (10ML) SYRINGE FOR IV PUSH (FOR BLOOD PRESSURE SUPPORT)
PREFILLED_SYRINGE | INTRAVENOUS | Status: AC
  Filled 2017-01-12: qty 10

## 2017-01-12 MED ORDER — ONDANSETRON HCL 4 MG/2ML IJ SOLN
INTRAMUSCULAR | Status: DC | PRN
  Administered 2017-01-12: 4 mg via INTRAVENOUS

## 2017-01-12 MED ORDER — HEMOSTATIC AGENTS (NO CHARGE) OPTIME
TOPICAL | Status: DC | PRN
  Administered 2017-01-12: 1 via TOPICAL

## 2017-01-12 MED ORDER — OXYCODONE HCL 5 MG PO TABS
ORAL_TABLET | ORAL | Status: AC
  Filled 2017-01-12: qty 2

## 2017-01-12 MED ORDER — FLEET ENEMA 7-19 GM/118ML RE ENEM: 1.0000 | ENEMA | Freq: Once | RECTAL | Status: DC | PRN

## 2017-01-12 MED ORDER — LIDOCAINE-EPINEPHRINE 1 %-1:100000 IJ SOLN
INTRAMUSCULAR | Status: DC | PRN
  Administered 2017-01-12: 5 mL

## 2017-01-12 MED ORDER — KETOROLAC TROMETHAMINE 15 MG/ML IJ SOLN
INTRAMUSCULAR | Status: AC
  Filled 2017-01-12: qty 1

## 2017-01-12 MED ORDER — CEFAZOLIN SODIUM-DEXTROSE 2-4 GM/100ML-% IV SOLN
2.0000 g | INTRAVENOUS | Status: AC
  Administered 2017-01-12 (×2): 2 g via INTRAVENOUS

## 2017-01-12 MED ORDER — CEFAZOLIN SODIUM-DEXTROSE 2-4 GM/100ML-% IV SOLN
2.0000 g | Freq: Three times a day (TID) | INTRAVENOUS | Status: AC
  Administered 2017-01-12 – 2017-01-13 (×2): 2 g via INTRAVENOUS
  Filled 2017-01-12 (×2): qty 100

## 2017-01-12 MED ORDER — THROMBIN (RECOMBINANT) 5000 UNITS EX SOLR
CUTANEOUS | Status: AC
  Filled 2017-01-12: qty 5000

## 2017-01-12 MED ORDER — THROMBIN (RECOMBINANT) 20000 UNITS EX SOLR
CUTANEOUS | Status: AC
  Filled 2017-01-12: qty 20000

## 2017-01-12 MED ORDER — DEXAMETHASONE SODIUM PHOSPHATE 10 MG/ML IJ SOLN
INTRAMUSCULAR | Status: DC | PRN
  Administered 2017-01-12: 10 mg via INTRAVENOUS

## 2017-01-12 MED ORDER — ZOLPIDEM TARTRATE 5 MG PO TABS
5.0000 mg | ORAL_TABLET | Freq: Every day | ORAL | Status: DC
  Administered 2017-01-12 – 2017-01-13 (×2): 5 mg via ORAL
  Filled 2017-01-12 (×2): qty 1

## 2017-01-12 MED ORDER — LIDOCAINE-EPINEPHRINE 1 %-1:100000 IJ SOLN
INTRAMUSCULAR | Status: AC
  Filled 2017-01-12: qty 1

## 2017-01-12 MED ORDER — HYDROCODONE-ACETAMINOPHEN 5-325 MG PO TABS
1.0000 | ORAL_TABLET | ORAL | Status: DC | PRN
  Administered 2017-01-13 – 2017-01-14 (×2): 1 via ORAL
  Filled 2017-01-12 (×2): qty 1

## 2017-01-12 MED ORDER — POLYETHYLENE GLYCOL 3350 17 G PO PACK: 17.0000 g | PACK | Freq: Every day | ORAL | Status: DC | PRN

## 2017-01-12 MED ORDER — SUMATRIPTAN SUCCINATE 100 MG PO TABS
100.0000 mg | ORAL_TABLET | ORAL | Status: DC | PRN
  Filled 2017-01-12: qty 1

## 2017-01-12 MED ORDER — 0.9 % SODIUM CHLORIDE (POUR BTL) OPTIME
TOPICAL | Status: DC | PRN
  Administered 2017-01-12: 1000 mL

## 2017-01-12 MED ORDER — LACTATED RINGERS IV SOLN
INTRAVENOUS | Status: DC
  Administered 2017-01-12: 21:00:00 via INTRAVENOUS

## 2017-01-12 MED ORDER — KETOROLAC TROMETHAMINE 15 MG/ML IJ SOLN
15.0000 mg | Freq: Four times a day (QID) | INTRAMUSCULAR | Status: AC
  Administered 2017-01-12 – 2017-01-13 (×4): 15 mg via INTRAVENOUS
  Filled 2017-01-12 (×3): qty 1

## 2017-01-12 MED ORDER — SODIUM CHLORIDE 0.9 % IV SOLN: 250.0000 mL | INTRAVENOUS | Status: DC

## 2017-01-12 MED ORDER — SENNA 8.6 MG PO TABS
1.0000 | ORAL_TABLET | Freq: Two times a day (BID) | ORAL | Status: DC
  Administered 2017-01-12 – 2017-01-14 (×5): 8.6 mg via ORAL
  Filled 2017-01-12 (×5): qty 1

## 2017-01-12 MED ORDER — PRAMIPEXOLE DIHYDROCHLORIDE 1.5 MG PO TABS
1.5000 mg | ORAL_TABLET | Freq: Every day | ORAL | Status: DC
  Administered 2017-01-12 – 2017-01-13 (×2): 1.5 mg via ORAL
  Filled 2017-01-12 (×2): qty 1

## 2017-01-12 MED ORDER — PROMETHAZINE HCL 25 MG/ML IJ SOLN: 6.2500 mg | INTRAMUSCULAR | Status: DC | PRN

## 2017-01-12 MED ORDER — BUPIVACAINE HCL (PF) 0.5 % IJ SOLN
INTRAMUSCULAR | Status: AC
Start: 2017-01-12 — End: ?
  Filled 2017-01-12: qty 30

## 2017-01-12 MED ORDER — LACTATED RINGERS IV SOLN
INTRAVENOUS | Status: DC | PRN
  Administered 2017-01-12 (×3): via INTRAVENOUS

## 2017-01-12 MED ORDER — SUGAMMADEX SODIUM 200 MG/2ML IV SOLN
INTRAVENOUS | Status: AC
  Filled 2017-01-12: qty 2

## 2017-01-12 MED ORDER — ONDANSETRON HCL 4 MG/2ML IJ SOLN
INTRAMUSCULAR | Status: AC
  Filled 2017-01-12: qty 2

## 2017-01-12 MED ORDER — FENTANYL CITRATE (PF) 250 MCG/5ML IJ SOLN
INTRAMUSCULAR | Status: AC
  Filled 2017-01-12: qty 5

## 2017-01-12 MED ORDER — TIZANIDINE HCL 4 MG PO CAPS: 4.0000 mg | ORAL_CAPSULE | Freq: Every evening | ORAL | Status: DC | PRN

## 2017-01-12 MED ORDER — BISACODYL 10 MG RE SUPP: 10.0000 mg | Freq: Every day | RECTAL | Status: DC | PRN

## 2017-01-12 MED ORDER — VALACYCLOVIR HCL 500 MG PO TABS
1000.0000 mg | ORAL_TABLET | Freq: Every day | ORAL | Status: DC | PRN
  Filled 2017-01-12: qty 2

## 2017-01-12 MED ORDER — MORPHINE SULFATE (PF) 4 MG/ML IV SOLN: 2.0000 mg | INTRAVENOUS | Status: DC | PRN

## 2017-01-12 MED ORDER — ROCURONIUM BROMIDE 10 MG/ML (PF) SYRINGE
PREFILLED_SYRINGE | INTRAVENOUS | Status: AC
  Filled 2017-01-12: qty 5

## 2017-01-12 MED ORDER — DEXAMETHASONE SODIUM PHOSPHATE 10 MG/ML IJ SOLN
INTRAMUSCULAR | Status: AC
  Filled 2017-01-12: qty 1

## 2017-01-12 MED ORDER — PROPOFOL 10 MG/ML IV BOLUS
INTRAVENOUS | Status: DC | PRN
  Administered 2017-01-12: 150 mg via INTRAVENOUS

## 2017-01-12 MED ORDER — METHOCARBAMOL 1000 MG/10ML IJ SOLN
500.0000 mg | Freq: Four times a day (QID) | INTRAVENOUS | Status: DC | PRN
  Filled 2017-01-12: qty 5

## 2017-01-12 MED ORDER — LIDOCAINE 2% (20 MG/ML) 5 ML SYRINGE
INTRAMUSCULAR | Status: AC
  Filled 2017-01-12: qty 5

## 2017-01-12 MED ORDER — METHOCARBAMOL 500 MG PO TABS
ORAL_TABLET | ORAL | Status: AC
  Filled 2017-01-12: qty 1

## 2017-01-12 MED ORDER — HEMOSTATIC AGENTS (NO CHARGE) OPTIME
TOPICAL | Status: DC | PRN
  Administered 2017-01-12 (×2): 1 via TOPICAL

## 2017-01-12 MED ORDER — OXYCODONE HCL 5 MG PO TABS
10.0000 mg | ORAL_TABLET | ORAL | Status: DC | PRN
  Administered 2017-01-12 – 2017-01-13 (×7): 10 mg via ORAL
  Administered 2017-01-14: 5 mg via ORAL
  Administered 2017-01-14 (×2): 10 mg via ORAL
  Filled 2017-01-12 (×9): qty 2

## 2017-01-12 MED ORDER — CEFAZOLIN SODIUM-DEXTROSE 2-4 GM/100ML-% IV SOLN
INTRAVENOUS | Status: AC
Start: 2017-01-12 — End: 2017-01-12
  Filled 2017-01-12: qty 100

## 2017-01-12 MED ORDER — SODIUM CHLORIDE 0.9 % IR SOLN
Status: DC | PRN
  Administered 2017-01-12: 500 mL

## 2017-01-12 MED ORDER — HYDROMORPHONE HCL 1 MG/ML IJ SOLN: 0.2500 mg | INTRAMUSCULAR | Status: DC | PRN

## 2017-01-12 MED ORDER — FENTANYL CITRATE (PF) 100 MCG/2ML IJ SOLN
INTRAMUSCULAR | Status: DC | PRN
  Administered 2017-01-12 (×5): 50 ug via INTRAVENOUS
  Administered 2017-01-12: 150 ug via INTRAVENOUS

## 2017-01-12 MED ORDER — EPHEDRINE 5 MG/ML INJ
INTRAVENOUS | Status: AC
  Filled 2017-01-12: qty 10

## 2017-01-12 MED ORDER — MENTHOL 3 MG MT LOZG: 1.0000 | LOZENGE | OROMUCOSAL | Status: DC | PRN

## 2017-01-12 MED ORDER — SUGAMMADEX SODIUM 200 MG/2ML IV SOLN
INTRAVENOUS | Status: DC | PRN
  Administered 2017-01-12: 200 mg via INTRAVENOUS

## 2017-01-12 MED ORDER — ALBUMIN HUMAN 5 % IV SOLN
INTRAVENOUS | Status: DC | PRN
  Administered 2017-01-12 (×2): via INTRAVENOUS

## 2017-01-12 MED ORDER — PRAMIPEXOLE DIHYDROCHLORIDE 0.25 MG PO TABS
0.5000 mg | ORAL_TABLET | Freq: Three times a day (TID) | ORAL | Status: DC
  Filled 2017-01-12 (×2): qty 2

## 2017-01-12 MED ORDER — ACETAMINOPHEN 325 MG PO TABS
650.0000 mg | ORAL_TABLET | ORAL | Status: DC | PRN
  Administered 2017-01-14: 650 mg via ORAL
  Filled 2017-01-12: qty 2

## 2017-01-12 MED ORDER — PANTOPRAZOLE SODIUM 40 MG PO TBEC
40.0000 mg | DELAYED_RELEASE_TABLET | Freq: Every day | ORAL | Status: DC
  Administered 2017-01-13 – 2017-01-14 (×2): 40 mg via ORAL
  Filled 2017-01-12 (×2): qty 1

## 2017-01-12 MED ORDER — PROPOFOL 10 MG/ML IV BOLUS
INTRAVENOUS | Status: AC
  Filled 2017-01-12: qty 20

## 2017-01-12 MED ORDER — BISOPROLOL-HYDROCHLOROTHIAZIDE 2.5-6.25 MG PO TABS
1.0000 | ORAL_TABLET | Freq: Every day | ORAL | Status: DC
  Administered 2017-01-13 – 2017-01-14 (×2): 1 via ORAL
  Filled 2017-01-12 (×2): qty 1

## 2017-01-12 MED ORDER — ACETAMINOPHEN 650 MG RE SUPP: 650.0000 mg | RECTAL | Status: DC | PRN

## 2017-01-12 MED ORDER — PHENYLEPHRINE 40 MCG/ML (10ML) SYRINGE FOR IV PUSH (FOR BLOOD PRESSURE SUPPORT)
PREFILLED_SYRINGE | INTRAVENOUS | Status: DC | PRN
  Administered 2017-01-12: 120 ug via INTRAVENOUS
  Administered 2017-01-12: 80 ug via INTRAVENOUS

## 2017-01-12 MED ORDER — SODIUM CHLORIDE 0.9% FLUSH: 3.0000 mL | Freq: Two times a day (BID) | INTRAVENOUS | Status: DC

## 2017-01-12 SURGICAL SUPPLY — 75 items
BAG DECANTER FOR FLEXI CONT (MISCELLANEOUS) ×2 IMPLANT
BASKET BONE COLLECTION (BASKET) ×2 IMPLANT
BLADE CLIPPER SURG (BLADE) IMPLANT
BUR MATCHSTICK NEURO 3.0 LAGG (BURR) ×2 IMPLANT
CAGE MAS PLIF 9X9X23-8 LUMBAR (Cage) ×8 IMPLANT
CANISTER SUCT 3000ML PPV (MISCELLANEOUS) ×2 IMPLANT
CARTRIDGE OIL MAESTRO DRILL (MISCELLANEOUS) ×1 IMPLANT
CONNECTOR RELINE-O 35-45 5.5 (Connector) ×2 IMPLANT
CONT SPEC 4OZ CLIKSEAL STRL BL (MISCELLANEOUS) ×2 IMPLANT
COVER BACK TABLE 60X90IN (DRAPES) ×2 IMPLANT
DECANTER SPIKE VIAL GLASS SM (MISCELLANEOUS) ×4 IMPLANT
DERMABOND ADVANCED (GAUZE/BANDAGES/DRESSINGS) ×1
DERMABOND ADVANCED .7 DNX12 (GAUZE/BANDAGES/DRESSINGS) ×1 IMPLANT
DEVICE DISSECT PLASMABLAD 3.0S (MISCELLANEOUS) ×1 IMPLANT
DIFFUSER DRILL AIR PNEUMATIC (MISCELLANEOUS) ×2 IMPLANT
DRAPE C-ARM 42X72 X-RAY (DRAPES) ×4 IMPLANT
DRAPE HALF SHEET 40X57 (DRAPES) ×2 IMPLANT
DRAPE LAPAROTOMY 100X72X124 (DRAPES) ×2 IMPLANT
DRAPE POUCH INSTRU U-SHP 10X18 (DRAPES) ×2 IMPLANT
DRSG OPSITE POSTOP 4X8 (GAUZE/BANDAGES/DRESSINGS) ×2 IMPLANT
DURAPREP 26ML APPLICATOR (WOUND CARE) ×2 IMPLANT
DURASEAL APPLICATOR TIP (TIP) IMPLANT
DURASEAL SPINE SEALANT 3ML (MISCELLANEOUS) IMPLANT
ELECT REM PT RETURN 9FT ADLT (ELECTROSURGICAL) ×2
ELECTRODE REM PT RTRN 9FT ADLT (ELECTROSURGICAL) ×1 IMPLANT
GAUZE SPONGE 4X4 12PLY STRL (GAUZE/BANDAGES/DRESSINGS) IMPLANT
GAUZE SPONGE 4X4 16PLY XRAY LF (GAUZE/BANDAGES/DRESSINGS) ×2 IMPLANT
GLOVE BIO SURGEON STRL SZ7 (GLOVE) ×4 IMPLANT
GLOVE BIOGEL PI IND STRL 7.0 (GLOVE) ×1 IMPLANT
GLOVE BIOGEL PI IND STRL 7.5 (GLOVE) ×2 IMPLANT
GLOVE BIOGEL PI IND STRL 8.5 (GLOVE) ×2 IMPLANT
GLOVE BIOGEL PI INDICATOR 7.0 (GLOVE) ×1
GLOVE BIOGEL PI INDICATOR 7.5 (GLOVE) ×2
GLOVE BIOGEL PI INDICATOR 8.5 (GLOVE) ×2
GLOVE ECLIPSE 8.5 STRL (GLOVE) ×4 IMPLANT
GLOVE SS BIOGEL STRL SZ 7.5 (GLOVE) ×1 IMPLANT
GLOVE SUPERSENSE BIOGEL SZ 7.5 (GLOVE) ×1
GLOVE SURG SS PI 7.5 STRL IVOR (GLOVE) ×10 IMPLANT
GOWN STRL REUS W/ TWL LRG LVL3 (GOWN DISPOSABLE) ×3 IMPLANT
GOWN STRL REUS W/ TWL XL LVL3 (GOWN DISPOSABLE) IMPLANT
GOWN STRL REUS W/TWL 2XL LVL3 (GOWN DISPOSABLE) ×4 IMPLANT
GOWN STRL REUS W/TWL LRG LVL3 (GOWN DISPOSABLE) ×3
GOWN STRL REUS W/TWL XL LVL3 (GOWN DISPOSABLE)
HEMOSTAT POWDER KIT SURGIFOAM (HEMOSTASIS) ×4 IMPLANT
KIT BASIN OR (CUSTOM PROCEDURE TRAY) ×2 IMPLANT
KIT INFUSE MEDIUM (Orthopedic Implant) ×2 IMPLANT
KIT ROOM TURNOVER OR (KITS) ×2 IMPLANT
MILL MEDIUM DISP (BLADE) ×2 IMPLANT
MODULE POWER NUVASIVE (MISCELLANEOUS) ×1 IMPLANT
NEEDLE HYPO 22GX1.5 SAFETY (NEEDLE) ×2 IMPLANT
NEEDLE SPNL 18GX3.5 QUINCKE PK (NEEDLE) ×2 IMPLANT
NS IRRIG 1000ML POUR BTL (IV SOLUTION) ×2 IMPLANT
OIL CARTRIDGE MAESTRO DRILL (MISCELLANEOUS) ×2
PACK LAMINECTOMY NEURO (CUSTOM PROCEDURE TRAY) ×2 IMPLANT
PAD ARMBOARD 7.5X6 YLW CONV (MISCELLANEOUS) ×10 IMPLANT
PATTIES SURGICAL .5 X1 (DISPOSABLE) ×4 IMPLANT
PATTIES SURGICAL 1X1 (DISPOSABLE) ×2 IMPLANT
PLASMABLADE 3.0S (MISCELLANEOUS) ×2
POWER MODULE NUVASIVE (MISCELLANEOUS) ×2
ROD RELINE TI LORD 5.5X70 (Rod) ×4 IMPLANT
SCREW LOCK RELINE 5.5 TULIP (Screw) ×12 IMPLANT
SCREW RELINE-O POLY 6.5X45 (Screw) ×12 IMPLANT
SPONGE LAP 4X18 X RAY DECT (DISPOSABLE) IMPLANT
SPONGE SURGIFOAM ABS GEL 100 (HEMOSTASIS) ×2 IMPLANT
SUT PROLENE 6 0 BV (SUTURE) IMPLANT
SUT VIC AB 1 CT1 18XBRD ANBCTR (SUTURE) ×1 IMPLANT
SUT VIC AB 1 CT1 8-18 (SUTURE) ×1
SUT VIC AB 2-0 CP2 18 (SUTURE) ×2 IMPLANT
SUT VIC AB 3-0 SH 8-18 (SUTURE) ×4 IMPLANT
SYR 3ML LL SCALE MARK (SYRINGE) ×8 IMPLANT
SYR 5ML LL (SYRINGE) IMPLANT
TOWEL GREEN STERILE (TOWEL DISPOSABLE) ×2 IMPLANT
TOWEL GREEN STERILE FF (TOWEL DISPOSABLE) ×2 IMPLANT
TRAY FOLEY W/METER SILVER 16FR (SET/KITS/TRAYS/PACK) ×2 IMPLANT
WATER STERILE IRR 1000ML POUR (IV SOLUTION) ×2 IMPLANT

## 2017-01-12 NOTE — Anesthesia Postprocedure Evaluation (Signed)
Anesthesia Post Note  Patient: Carolyn Graham  Procedure(s) Performed: Lumbar TwoThree,Lumbar Three-Four Posterior lumbar interbody fusion (N/A Spine Lumbar)     Patient location during evaluation: PACU Anesthesia Type: General Level of consciousness: sedated Pain management: pain level controlled Vital Signs Assessment: post-procedure vital signs reviewed and stable Respiratory status: spontaneous breathing and respiratory function stable Cardiovascular status: stable Postop Assessment: no apparent nausea or vomiting Anesthetic complications: no    Last Vitals:  Vitals:   01/12/17 1405 01/12/17 1440  BP: 126/74   Pulse:  90  Resp: 16   Temp: 36.8 C   SpO2: 93% 98%    Last Pain:  Vitals:   01/12/17 1410  TempSrc:   PainSc: 0-No pain                 Jlyn Bracamonte DANIEL

## 2017-01-12 NOTE — Op Note (Signed)
Date of surgery: 01/12/2017 Preoperative diagnosis: Herniated nucleus pulposus L2-3, spondylolisthesis L3-4, lumbar radiculopathy. Postoperative diagnosis: same.   Procedure: Lumbar decompression vialaminectomy L2-L4, posterior lumbar interbody arthrodesis using peek spacers autograft and Infuse atL2-3 and L3-4, pedicle screw fixationL2-L4, posterior lateral arthrodesis with autograft and L2-L4  Surgeon: Kristeen Miss M.D. Assistant:Benjamin ditty M.D. Anesthesia: Gen. endotracheal Indications:patient is a 61 year old individual who's had significant back pain with a degenerative spondylolisthesis at L3-L4 adjacent level disc herniation at L2-L3. She is failed efforts at conservative management was advised regarding surgical decompression and stabilization from L2-L4.  Procedure: She was brought to the operating room. After the smooth induction of general endotracheal anesthesia the patient was carefully turned to the prone position padding the bony prominences appropriately. The back was prepped with alcohol and DuraPrep and draped in a sterile fashion. A midline incision was created and carried down to the lumbar dorsal fascia. Fascia was opened on either side of the midline to expose the spinous processes of the lower lumbar spine. These were identified asL2 and L3on the radiograph. The dissection was then completed to exposeL2-L3 and L4 out to and beyond the facets.Laminectomy was created moving the inferior marginal lamina of L2 including the entirety of the facet at L2-L3. The disc space was exposed. The and L2 nerve roots were decompressed as were the L3 nerve roots inferiorly.A complete laminectomy of L3 was performed removing the entire arch and the L3-L4 facets. Inferiorly the L4 nerve roots were isolated and decompressed with a 2 and 3 mm Kerrison punch. The disc space was isolated at L3-L4.  Next a complete discectomy was performed at L2-3 and L3-4. The endplates were curettaged completely  and the interspace was sized for appropriate size spacer. Is felt that an 8 mm tall 23 mm long spacer with a degrees of lordosis fit best at L2-3. At L3-4 on 9 mm tall 8 lordosis 23 mm long spacer fit best. At L3-4 6 mL of bone graft along with 2 strips of infuse and the 2 cages were placed at L to 3 6 mL of bone graft with 2 strips of infuse and the cages were placed.Marland Kitchen  Next the lateral gutters were decorticated and strips of infuse along with the remainder of the autograft was laid into the lateral gutters. Pedicle entry sites were then chosen fluoroscopically at L2 L3 L4 6.5 x 45 mm screws were placed into each of these areas.   0 mmRods were then contoured appropriately and placed into each lateral gutter connecting the screws together. The lateral gutters were packed with a mixture of autograft and allograft. The system was torqued into final locking position. Vital radiographs confirm good position of the hardware and stable alignment of the spine. Wound was irrigated copiously with antibiotic irrigating solution and then closed with #1 Vicryl in interrupted fashion 20 cc of half percent Marcaine was injected into the paraspinous fascia at the time of closure. Subcutaneous tissues was clipped were closed with 2-0 Vicryl and 3-0 Vicryl is used to close subcuticular skin. Blood loss was estimated at400 mL.

## 2017-01-12 NOTE — H&P (Signed)
Date of admission: 01/12/2017 Chief complaint: Back and bilateral leg pain History of present illness: Patient is a *61 year old individual who's had significantback pain for the past 2 years time. She been a patient of Dr. Harley Hallmark in the past and initially I advise conservative efforts to treat her back pain. She had known spondylitic disease at L to [phone] worse 2 levels of involvement included L2-3 where there is a large central disc protrusion L3-4 with a grade 1 spondylolisthesis. She seemed to tolerate things for the first year but in the past year she's had increasing difficulty with her back and legs to the point where she is becoming minimally ambulatory and has nearly chronic pain whether at rest or with activity. Certainly activity she notes aggravates the pain substantially. A recent MRI demonstrated that there is progression of the spondylolisthesis and the stenosis at L2-3 and L3-4. L4-5 she has some modest spondylitic changes but no overt nerve root entrapment. L5-S1 appears to be fairly stable. After careful consideration I advised two-level decompression and fusion from L2-L4. She is admitted for the surgery now.  Past medical history reveals patient has a history of hypertension. Her health is otherwise been good medications are listed elsewhere.Social history reveals that the patient is married. Review of systems is noted for the items in history of pleasant illness  Physical exam reveals that she is alert oriented. She stands straight and erect with some difficulty but tends to favor slight forward stoop. She also leans off to the right side a bit when she stands she moves forward her motor function appears to be good in iliopsoas and the quadriceps tibialis anterior and gastrocs all appear to have good strength deep tendon reflexes are 1+ in the patellae absent in the Achilles Babinskis are downgoing her cranial nerve examination is in tact upper extremity strength and reflexes are  normal. Gen. Physical exam reveals the HEENT exam is normal lungs are clear to auscultation heart has a regular rate and rhythm abdomen is soft bowel sounds are positive no masses are noted extremities feel no cyanosis clubbing or edema  Impression: The patient has evidence of severe spondylitic disease at L2-3 and L3-4. She has some milder changes at L4-5 is felt that the worst areas of involvement include an anterolateral and lateral listhesis at the L3-4 level and stenosis at L2-L3. She's been advised regarding surgical decompression and stabilization from L2-L4.

## 2017-01-12 NOTE — Transfer of Care (Signed)
Immediate Anesthesia Transfer of Care Note  Patient: Carolyn Graham  Procedure(s) Performed: Lumbar TwoThree,Lumbar Three-Four Posterior lumbar interbody fusion (N/A Spine Lumbar)  Patient Location: PACU  Anesthesia Type:General  Level of Consciousness: awake and alert   Airway & Oxygen Therapy: Patient Spontanous Breathing and Patient connected to face mask oxygen  Post-op Assessment: Report given to RN, Post -op Vital signs reviewed and stable and Patient moving all extremities X 4  Post vital signs: Reviewed and stable  Last Vitals:  Vitals:   01/12/17 0556  BP: (!) 105/52  Pulse: 70  Resp: 18  Temp: 36.8 C  SpO2: 94%    Last Pain:  Vitals:   01/12/17 0556  TempSrc: Oral      Patients Stated Pain Goal: 1 (76/81/15 7262)  Complications: No apparent anesthesia complications

## 2017-01-12 NOTE — Anesthesia Preprocedure Evaluation (Addendum)
Anesthesia Evaluation  Patient identified by MRN, date of birth, ID band Patient awake    Reviewed: Allergy & Precautions, Patient's Chart, lab work & pertinent test results  History of Anesthesia Complications (+) PONV and history of anesthetic complications  Airway Mallampati: III  TM Distance: >3 FB Neck ROM: Full    Dental  (+) Teeth Intact, Dental Advisory Given   Pulmonary neg pulmonary ROS, sleep apnea ,    Pulmonary exam normal        Cardiovascular hypertension, negative cardio ROS Normal cardiovascular exam     Neuro/Psych CVA, No Residual Symptoms negative psych ROS   GI/Hepatic negative GI ROS, Neg liver ROS, GERD  ,  Endo/Other  Hyperthyroidism Morbid obesity  Renal/GU negative Renal ROS     Musculoskeletal  (+) Arthritis , Fibromyalgia -  Abdominal   Peds  Hematology negative hematology ROS (+)   Anesthesia Other Findings Day of surgery medications reviewed with the patient.  Reproductive/Obstetrics                           Anesthesia Physical Anesthesia Plan  ASA: III  Anesthesia Plan: General   Post-op Pain Management:    Induction: Intravenous  PONV Risk Score and Plan: 4 or greater and Ondansetron, Dexamethasone, Midazolam and Scopolamine patch - Pre-op  Airway Management Planned: Oral ETT  Additional Equipment:   Intra-op Plan:   Post-operative Plan: Extubation in OR  Informed Consent: I have reviewed the patients History and Physical, chart, labs and discussed the procedure including the risks, benefits and alternatives for the proposed anesthesia with the patient or authorized representative who has indicated his/her understanding and acceptance.   Dental advisory given  Plan Discussed with: CRNA, Anesthesiologist and Surgeon  Anesthesia Plan Comments:        Anesthesia Quick Evaluation

## 2017-01-12 NOTE — Progress Notes (Signed)
Patient setup and place don CPAP Auto mode with no o2 bleed in at this time. No issues. Patient has home hose and mask was missing a piece, so she is using our mask until her mask is compete. No issues at this time.

## 2017-01-12 NOTE — Anesthesia Procedure Notes (Signed)
Procedure Name: Intubation Date/Time: 01/12/2017 7:43 AM Performed by: Harden Mo, CRNA Pre-anesthesia Checklist: Patient identified, Emergency Drugs available, Suction available and Patient being monitored Patient Re-evaluated:Patient Re-evaluated prior to induction Oxygen Delivery Method: Circle System Utilized Preoxygenation: Pre-oxygenation with 100% oxygen Induction Type: IV induction Ventilation: Mask ventilation without difficulty and Oral airway inserted - appropriate to patient size Laryngoscope Size: Sabra Heck and 2 Grade View: Grade II Tube type: Oral Tube size: 7.5 mm Number of attempts: 1 Airway Equipment and Method: Stylet and Oral airway Placement Confirmation: ETT inserted through vocal cords under direct vision,  positive ETCO2 and breath sounds checked- equal and bilateral Secured at: 22 cm Tube secured with: Tape Dental Injury: Teeth and Oropharynx as per pre-operative assessment

## 2017-01-13 LAB — BASIC METABOLIC PANEL
Anion gap: 7 (ref 5–15)
BUN: 11 mg/dL (ref 6–20)
CALCIUM: 8.4 mg/dL — AB (ref 8.9–10.3)
CO2: 25 mmol/L (ref 22–32)
CREATININE: 0.91 mg/dL (ref 0.44–1.00)
Chloride: 106 mmol/L (ref 101–111)
GFR calc non Af Amer: >60 mL/min (ref >60)
Glucose, Bld: 143 mg/dL — ABNORMAL HIGH (ref 65–99)
Potassium: 3.7 mmol/L (ref 3.5–5.1)
SODIUM: 138 mmol/L (ref 135–145)

## 2017-01-13 LAB — CBC
HCT: 31.8 % — ABNORMAL LOW (ref 36.0–46.0)
HEMOGLOBIN: 10.1 g/dL — AB (ref 12.0–15.0)
MCH: 28.5 pg (ref 26.0–34.0)
MCHC: 31.8 g/dL (ref 30.0–36.0)
MCV: 89.6 fL (ref 78.0–100.0)
Platelets: 207 10*3/uL (ref 150–400)
RBC: 3.55 MIL/uL — AB (ref 3.87–5.11)
RDW: 14.8 % (ref 11.5–15.5)
WBC: 13.4 10*3/uL — ABNORMAL HIGH (ref 4.0–10.5)

## 2017-01-13 MED ORDER — METHOCARBAMOL 500 MG PO TABS: 500.0000 mg | ORAL_TABLET | Freq: Four times a day (QID) | ORAL | 3 refills | Status: DC | PRN

## 2017-01-13 MED ORDER — OXYCODONE HCL 10 MG PO TABS: 10.0000 mg | ORAL_TABLET | ORAL | 0 refills | Status: DC | PRN

## 2017-01-13 MED FILL — Sodium Chloride IV Soln 0.9%: INTRAVENOUS | Qty: 1000 | Status: AC

## 2017-01-13 MED FILL — Heparin Sodium (Porcine) Inj 1000 Unit/ML: INTRAMUSCULAR | Qty: 30 | Status: AC

## 2017-01-13 NOTE — Progress Notes (Signed)
OOB with assist with rolling walker and walked to window in room and bathroom and tolerated well.

## 2017-01-13 NOTE — Evaluation (Signed)
Physical Therapy Evaluation Patient Details Name: Carolyn Graham MRN: [id number] DOB: May 21, 1955 Today's Date: 01/13/2017   History of Present Illness  Pt is a 61 y.o. female s/p L2-4 PLIF. PMHx: Pt with significant PMH of vertigo, stroke, stress incontinence, RLS, HTN, fibromyalgia, R ankle fx, lumbar spine surgery in 2011, and cervical disc surgery.  Clinical Impression  Pt is mobilizing well, min guard assist with hallway ambulation with RW.  I deferred stairs today as she was having hot flashes in the hallway and often, that is precursor to syncope.  She will likely do well with stairs in AM.  Husband will be here around 9 am for stair training with PT and hopeful d/c in AM all things go well and MD deems appropriate.  Back education and handout given.  PT to follow acutely until d/c confirmed.       Follow Up Recommendations No PT follow up;Supervision for mobility/OOB    Equipment Recommendations  Rolling walker with 5" wheels    Recommendations for Other Services   NA    Precautions / Restrictions Precautions Precautions: Back Precaution Booklet Issued: No Precaution Comments: Educated pt and husband on back precautions Required Braces or Orthoses: Spinal Brace Spinal Brace: Lumbar corset;Applied in sitting position Restrictions Weight Bearing Restrictions: No      Mobility  Bed Mobility Overal bed mobility: Needs Assistance Bed Mobility: Rolling;Sidelying to Sit Rolling: Supervision Sidelying to sit: Supervision;HOB elevated       General bed mobility comments: Pt met me in the hallway after working with OT  Transfers Overall transfer level: Needs assistance Equipment used: Rolling walker (2 wheeled) Transfers: Sit to/from Stand Sit to Stand: Supervision         General transfer comment: Supervision for safety to lower to sitting from standing using armrests of chair to control descent.   Ambulation/Gait Ambulation/Gait assistance: Min guard Ambulation  Distance (Feet): 130 Feet Assistive device: Rolling walker (2 wheeled)   Gait velocity: decreased Gait velocity interpretation: Below normal speed for age/gender General Gait Details: Good upright gait pattern, pt inbetween handles with good posture.  Min guard assist for safety as pt reported being hot and I wanted to make sure it was not a precursor to syncope.           Balance Overall balance assessment: Needs assistance Sitting-balance support: Feet supported;No upper extremity supported Sitting balance-Leahy Scale: Good     Standing balance support: Bilateral upper extremity supported;No upper extremity supported Standing balance-Leahy Scale: Fair Standing balance comment: supervision with hands off of RW                             Pertinent Vitals/Pain Pain Assessment: Faces Pain Score: 6  Faces Pain Scale: Hurts even more Pain Location: back Pain Descriptors / Indicators: Aching;Sore Pain Intervention(s): Limited activity within patient's tolerance;Monitored during session;Premedicated before session;Repositioned    Home Living Family/patient expects to be discharged to:: Private residence Living Arrangements: Spouse/significant other Available Help at Discharge: Family;Available 24 hours/day(until next Wednesday or later if needed) Type of Home: House Home Access: Stairs to enter Entrance Stairs-Rails: None(husband can fit next to her on the steps) Entrance Stairs-Number of Steps: 2 Home Layout: Two level;Able to live on main level with bedroom/bathroom Home Equipment: Other (comment)(adjustable bed) Additional Comments: Neighbor may have equipment pt can borrow--husband to check and see what they have    Prior Function Level of Independence: Independent  Comments: walked flexed at the trunk, felt at risk for both legs to give out on her. Right leg prior to surgery seemed worse than left leg.         Extremity/Trunk Assessment   Upper  Extremity Assessment Upper Extremity Assessment: Defer to OT evaluation    Lower Extremity Assessment Lower Extremity Assessment: Overall WFL for tasks assessed(strength equal, sensation intact with no numbness)    Cervical / Trunk Assessment Cervical / Trunk Assessment: Other exceptions Cervical / Trunk Exceptions: s/p PLIF  Communication   Communication: No difficulties  Cognition Arousal/Alertness: Awake/alert Behavior During Therapy: WFL for tasks assessed/performed Overall Cognitive Status: Within Functional Limits for tasks assessed                                        General Comments General comments (skin integrity, edema, etc.): Extensive discussion re: home recovery, equipment needs, etc.         Assessment/Plan    PT Assessment Patient needs continued PT services  PT Problem List Decreased activity tolerance;Decreased balance;Decreased mobility;Decreased knowledge of use of DME;Decreased knowledge of precautions;Pain       PT Treatment Interventions DME instruction;Gait training;Stair training;Therapeutic activities;Functional mobility training;Therapeutic exercise;Balance training;Patient/family education    PT Goals (Current goals can be found in the Care Plan section)  Acute Rehab PT Goals Patient Stated Goal: decrease pain, return home PT Goal Formulation: With patient/family Time For Goal Achievement: 01/20/17 Potential to Achieve Goals: Good    Frequency Min 5X/week           AM-PAC PT "6 Clicks" Daily Activity  Outcome Measure Difficulty turning over in bed (including adjusting bedclothes, sheets and blankets)?: None Difficulty moving from lying on back to sitting on the side of the bed? : None Difficulty sitting down on and standing up from a chair with arms (e.g., wheelchair, bedside commode, etc,.)?: None Help needed moving to and from a bed to chair (including a wheelchair)?: None Help needed walking in hospital room?: A  Little Help needed climbing 3-5 steps with a railing? : A Little 6 Click Score: 22    End of Session Equipment Utilized During Treatment: Gait belt;Back brace Activity Tolerance: Patient limited by pain;Patient limited by fatigue Patient left: in chair;with call bell/phone within reach;with family/visitor present   PT Visit Diagnosis: Difficulty in walking, not elsewhere classified (R26.2);Other symptoms and signs involving the nervous system (R29.898);Pain Pain - Right/Left: (incisional) Pain - part of body: (back)    Time: 3419-3790 PT Time Calculation (min) (ACUTE ONLY): 34 min   Charges:          Wells Guiles B. Medina Degraffenreid, PT, DPT [phone]   PT Evaluation $PT Eval Moderate Complexity: 1 Mod PT Treatments $Gait Training: 8-22 mins   01/13/2017, 2:34 PM

## 2017-01-13 NOTE — Plan of Care (Signed)
  Progressing Education: Knowledge of General Education information will improve 01/13/2017 0307 - Progressing by Anson Fret, RN Note POC reviewed with pt. Pain Managment: General experience of comfort will improve 01/13/2017 0307 - Progressing by Anson Fret, RN Note Pain management reviewed with pt.

## 2017-01-13 NOTE — Discharge Summary (Signed)
Date of admission: 01/12/2017 Date of discharge: 01/14/2017 Admitting diagnosis: Herniated nucleus pulposus L2-3 with spondylolisthesis L3-4, lumbar radiculopathy Discharge and final diagnosis: Herniated nucleus pulposus L2-3 with spondylolisthesis L3-4, lumbar radiculopathy, acute blood loss anemia. Condition on discharge: Improved Hospital course: Patient was admitted to undergo surgical decompression arthrodesis at L2-3 and L3-4. She tolerated surgery well. Her incision was clean and dry. Motor function is been intact. The patient did sustain acute blood loss anemia with a hemoglobin drop of 4 g from the surgery. She did not require transfusion. Motor function is intact and she is discharged home with prescription for oxycodone 10 mg #60 without refills, Robaxin 500 mg #40 with 3 refills as needed for muscle spasm.

## 2017-01-13 NOTE — Evaluation (Signed)
Occupational Therapy Evaluation Patient Details Name: Carolyn Graham MRN: [id number] DOB: 01-20-1956 Today's Date: 01/13/2017    History of Present Illness Pt is a 61 y.o. female s/p L2-4 PLIF. PMHx: HTN.   Clinical Impression   Pt reports she was independent with ADL PTA. Currently pt requires min guard assist for ADL and functional mobility with the exception of min assist for LB ADL. Began back, safety, and ADL education with pt and family. Pt planning to d/c home with 24/7 supervision from family initially. Pt would benefit from continued skilled OT to address established goals.    Follow Up Recommendations  No OT follow up;Supervision/Assistance - 24 hour(initially)    Equipment Recommendations  Tub/shower seat    Recommendations for Other Services PT consult     Precautions / Restrictions Precautions Precautions: Back Precaution Booklet Issued: No Precaution Comments: Educated pt and husband on back precautions Required Braces or Orthoses: Spinal Brace Spinal Brace: Lumbar corset;Applied in sitting position Restrictions Weight Bearing Restrictions: No      Mobility Bed Mobility Overal bed mobility: Needs Assistance Bed Mobility: Rolling;Sidelying to Sit Rolling: Supervision Sidelying to sit: Supervision;HOB elevated       General bed mobility comments: Educated on log roll technique for bed mobility. Pt has adjustable bed at home so practiced with HOB elevated. Supervision for safety  Transfers Overall transfer level: Needs assistance Equipment used: Rolling walker (2 wheeled) Transfers: Sit to/from Stand Sit to Stand: Min guard         General transfer comment: for safety. Cues for hand placement with RW    Balance Overall balance assessment: Needs assistance Sitting-balance support: Feet supported;No upper extremity supported Sitting balance-Leahy Scale: Good     Standing balance support: Bilateral upper extremity supported Standing balance-Leahy  Scale: Poor Standing balance comment: RW for support                           ADL either performed or assessed with clinical judgement   ADL Overall ADL's : Needs assistance/impaired Eating/Feeding: Set up;Sitting   Grooming: Min guard;Standing Grooming Details (indicate cue type and reason): Educated on use of 2 cups for oral care Upper Body Bathing: Set up;Supervision/ safety;Sitting   Lower Body Bathing: Minimal assistance;Sit to/from stand   Upper Body Dressing : Set up;Supervision/safety;Sitting Upper Body Dressing Details (indicate cue type and reason): to don brace Lower Body Dressing: Minimal assistance;Sit to/from stand Lower Body Dressing Details (indicate cue type and reason): Difficulty crossing foot over opposite knee. Husband to assist as needed Toilet Transfer: Min guard;Ambulation;Comfort height toilet;RW   Toileting- Clothing Manipulation and Hygiene: Supervision/safety;Sitting/lateral lean Toileting - Clothing Manipulation Details (indicate cue type and reason): Educated on proper technique for peri care without twisting and use of wet wipes   Tub/Shower Transfer Details (indicate cue type and reason): Discussed use of shower chair--husband to see if neighbor has equipment. Discussed where he can purchase if needed. Husband to supervise initially Functional mobility during ADLs: Min guard;Rolling walker General ADL Comments: Educated pt on maintaining back precautions during functional activities, keeping frequently used items at counter top height     Vision         Perception     Praxis      Pertinent Vitals/Pain Pain Assessment: 0-10 Pain Score: 6  Pain Location: back Pain Descriptors / Indicators: Aching;Sore Pain Intervention(s): Monitored during session;Repositioned;Premedicated before session     Hand Dominance     Extremity/Trunk Assessment Upper Extremity  Assessment Upper Extremity Assessment: Overall WFL for tasks assessed    Lower Extremity Assessment Lower Extremity Assessment: Defer to PT evaluation   Cervical / Trunk Assessment Cervical / Trunk Assessment: Other exceptions Cervical / Trunk Exceptions: s/p PLIF   Communication Communication Communication: No difficulties   Cognition Arousal/Alertness: Awake/alert Behavior During Therapy: WFL for tasks assessed/performed Overall Cognitive Status: Within Functional Limits for tasks assessed                                     General Comments       Exercises     Shoulder Instructions      Home Living Family/patient expects to be discharged to:: Private residence Living Arrangements: Spouse/significant other Available Help at Discharge: Family;Available 24 hours/day(until next Wednesday or later if needed) Type of Home: House Home Access: Stairs to enter [employer] of Steps: 2 Entrance Stairs-Rails: None Home Layout: Two level;Able to live on main level with bedroom/bathroom     Bathroom Shower/Tub: Tub/shower unit;Door   Bathroom Toilet: Handicapped height     Home Equipment: Other (comment)(adjustable bed)   Additional Comments: Neighbor may have equipment pt can borrow--husband to check and see what they have      Prior Functioning/Environment Level of Independence: Independent                 OT Problem List: Impaired balance (sitting and/or standing);Decreased knowledge of use of DME or AE;Decreased knowledge of precautions;Obesity;Pain      OT Treatment/Interventions: Self-care/ADL training;DME and/or AE instruction;Therapeutic activities;Patient/family education;Balance training    OT Goals(Current goals can be found in the care plan section) Acute Rehab OT Goals Patient Stated Goal: decrease pain, return home OT Goal Formulation: With patient/family Time For Goal Achievement: 01/27/17 Potential to Achieve Goals: Good ADL Goals Pt Will Perform Tub/Shower Transfer: with  supervision;ambulating;shower seat;rolling walker;Tub transfer Additional ADL Goal #1: Pt will independently verbally recall 3/3 back precautions and maintain throughout ADL.  OT Frequency: Min 2X/week   Barriers to D/C:            Co-evaluation              AM-PAC PT "6 Clicks" Daily Activity     Outcome Measure Help from another person eating meals?: None Help from another person taking care of personal grooming?: A Little Help from another person toileting, which includes using toliet, bedpan, or urinal?: A Little Help from another person bathing (including washing, rinsing, drying)?: A Little Help from another person to put on and taking off regular upper body clothing?: None Help from another person to put on and taking off regular lower body clothing?: A Little 6 Click Score: 20   End of Session Equipment Utilized During Treatment: Rolling walker;Back brace  Activity Tolerance: Patient tolerated treatment well Patient left: Other (comment)(in hallway with PT)  OT Visit Diagnosis: Unsteadiness on feet (R26.81);Pain Pain - part of body: (back)                Time: 6606-3016 OT Time Calculation (min): 23 min Charges:  OT General Charges $OT Visit: 1 Visit OT Evaluation $OT Eval Moderate Complexity: 1 Mod OT Treatments $Self Care/Home Management : 8-22 mins G-Codes:     Jermani Eberlein A. Ulice Brilliant, M.S., OTR/L Pager: [place] 01/13/2017, 2:02 PM

## 2017-01-13 NOTE — Progress Notes (Signed)
Vital signs are stable Patient is mobilizing nicely Hemoglobin is down to 10.1 from 15 Secondary to acute blood loss anemia from surgery In light of vital signs being stable she'll not need a transfusion We will localize her today and see how she does with that If everything remains stable she may be ready for discharge in a.m.

## 2017-01-14 NOTE — Progress Notes (Signed)
Occupational Therapy Treatment Patient Details Name: Carolyn Graham MRN: [id number] DOB: April 15, 1955 Today's Date: 01/14/2017    History of present illness Pt is a 61 y.o. female s/p L2-4 PLIF. PMHx: Pt with significant PMH of vertigo, stroke, stress incontinence, RLS, HTN, fibromyalgia, R ankle fx, lumbar spine surgery in 2011, and cervical disc surgery.   OT comments  Pt progressing towards goals, impacted more today by pain - Session focus was tub transfer, staying on schedule for medication for pain management, frequent movement for pain management and education for caregiver in assisting Pt. Pt is able to move well and perform tasks, just painful at this time.   Follow Up Recommendations  No OT follow up;Supervision/Assistance - 24 hour(initially)    Equipment Recommendations  Tub/shower seat    Recommendations for Other Services PT consult    Precautions / Restrictions Precautions Precautions: Back Precaution Booklet Issued: No Precaution Comments: reviewed 3/3 back precautions with Pt Required Braces or Orthoses: Spinal Brace Spinal Brace: Lumbar corset;Applied in sitting position Restrictions Weight Bearing Restrictions: No       Mobility Bed Mobility Overal bed mobility: Needs Assistance Bed Mobility: Rolling;Sidelying to Sit Rolling: Supervision Sidelying to sit: Supervision;HOB elevated       General bed mobility comments: good bed mobility technique maintaining precautions  Transfers Overall transfer level: Needs assistance Equipment used: Rolling walker (2 wheeled) Transfers: Sit to/from Stand Sit to Stand: Min assist         General transfer comment: vc for safe hand placement, min A for power up and balance this session    Balance Overall balance assessment: Needs assistance Sitting-balance support: Feet supported;Bilateral upper extremity supported Sitting balance-Leahy Scale: Fair Sitting balance - Comments: pain impacting balance today    Standing balance support: Bilateral upper extremity supported;No upper extremity supported Standing balance-Leahy Scale: Fair Standing balance comment: reliant on RW for pain management                           ADL either performed or assessed with clinical judgement   ADL Overall ADL's : Needs assistance/impaired Eating/Feeding: Set up;Sitting   Grooming: Wash/dry hands;Wash/dry face;Set up;Sitting Grooming Details (indicate cue type and reason): in recliner this session                 Toilet Transfer: Min guard;Ambulation;Comfort height toilet;RW Armed forces technical officer Details (indicate cue type and reason): simulated through recliner transfer       Tub/Shower Transfer Details (indicate cue type and reason): Discussed thoroughly and demonstrated this session, Pt limited by pain this session (spasms), Educated on use of 3 in 1 as shower chair and talked with Pt about removing doors and temporarily replacing with curtain.  Functional mobility during ADLs: Min guard;Rolling walker General ADL Comments: reinforced yesterday's education as well as set up with dominant hand, cup method for oral care     Vision       Perception     Praxis      Cognition Arousal/Alertness: Awake/alert Behavior During Therapy: WFL for tasks assessed/performed Overall Cognitive Status: Within Functional Limits for tasks assessed                                          Exercises     Shoulder Instructions       General Comments      Pertinent Vitals/  Pain       Pain Assessment: 0-10 Pain Score: 8  Pain Location: back Pain Descriptors / Indicators: Aching;Cramping;Spasm Pain Intervention(s): Monitored during session;Repositioned;Patient requesting pain meds-RN notified  Home Living                                          Prior Functioning/Environment              Frequency  Min 2X/week        Progress Toward Goals  OT  Goals(current goals can now be found in the care plan section)  Progress towards OT goals: Progressing toward goals  Acute Rehab OT Goals Patient Stated Goal: decrease pain, return home OT Goal Formulation: With patient/family Time For Goal Achievement: 01/27/17 Potential to Achieve Goals: Good  Plan Discharge plan remains appropriate;Frequency remains appropriate    Co-evaluation                 AM-PAC PT "6 Clicks" Daily Activity     Outcome Measure   Help from another person eating meals?: None Help from another person taking care of personal grooming?: A Little Help from another person toileting, which includes using toliet, bedpan, or urinal?: A Little Help from another person bathing (including washing, rinsing, drying)?: A Little Help from another person to put on and taking off regular upper body clothing?: None Help from another person to put on and taking off regular lower body clothing?: A Little 6 Click Score: 20    End of Session Equipment Utilized During Treatment: Rolling walker;Back brace  OT Visit Diagnosis: Unsteadiness on feet (R26.81);Pain   Activity Tolerance Patient tolerated treatment well   Patient Left in chair;with call bell/phone within reach;with family/visitor present   Nurse Communication Mobility status;Patient requests pain meds        Time: 0932-0958 OT Time Calculation (min): 26 min  Charges: OT General Charges $OT Visit: 1 Visit OT Treatments $Self Care/Home Management : 23-37 mins  Hulda Humphrey OTR/L [place] 01/14/2017, 10:19 AM

## 2017-01-14 NOTE — Progress Notes (Signed)
Reviewed dc instructions with patient and Husband. Both report they understand dc instructions, medications, and follow up appointments. Will d/c home to self care. VSS/NAD  Prescilla Sours, RN

## 2017-01-14 NOTE — Progress Notes (Signed)
Physical Therapy Treatment Patient Details Name: Carolyn Graham MRN: [id number] DOB: 1955-04-07 Today's Date: 01/14/2017    History of Present Illness Pt is a 61 y.o. female s/p L2-4 PLIF. PMHx: Pt with significant PMH of vertigo, stroke, stress incontinence, RLS, HTN, fibromyalgia, R ankle fx, lumbar spine surgery in 2011, and cervical disc surgery.    PT Comments    Pt progressing well towards all goals.  Educated on stairs, spouse present to assist. Acute PT to con't to follow.  Follow Up Recommendations  Home health PT;Supervision/Assistance - 24 hour (HHPT per family request, instructed pt to ask for PT at follow up if she feels she is still having difficulty with hygiene and stairs)     Equipment Recommendations  Rolling walker with 5" wheels    Recommendations for Other Services       Precautions / Restrictions Precautions Precautions: Back Precaution Booklet Issued: No Precaution Comments: reviewed 3/3 back precautions with Pt Required Braces or Orthoses: Spinal Brace Spinal Brace: Lumbar corset;Applied in sitting position Restrictions Weight Bearing Restrictions: No    Mobility  Bed Mobility Overal bed mobility: Needs Assistance Bed Mobility: Rolling;Sidelying to Sit Rolling: Supervision Sidelying to sit: Supervision;HOB elevated       General bed mobility comments: pt up in chair upon PT arrival  Transfers Overall transfer level: Needs assistance Equipment used: Rolling walker (2 wheeled) Transfers: Sit to/from Stand Sit to Stand: Min guard         General transfer comment: v/c's to push up from walker/toliet/chair. pt with minimal carryover  Ambulation/Gait Ambulation/Gait assistance: Min guard Ambulation Distance (Feet): 200 Feet Assistive device: Rolling walker (2 wheeled) Gait Pattern/deviations: Step-through pattern;Decreased stride length Gait velocity: dec Gait velocity interpretation: Below normal speed for age/gender General Gait  Details: pt initially step to, progressed to step through, v/c's to decrease UE WBing   Stairs Stairs: Yes   Stair Management: One rail Left;Step to pattern;Sideways Number of Stairs: 3 General stair comments: attempted HHA to mimic one set of stairs without HR however pt unable. pt reports having 4 steps in front with handrail. pt able to complete with above method without difficulty  Wheelchair Mobility    Modified Rankin (Stroke Patients Only)       Balance Overall balance assessment: Needs assistance Sitting-balance support: Feet supported Sitting balance-Leahy Scale: Good Sitting balance - Comments: pain impacting balance today   Standing balance support: No upper extremity supported Standing balance-Leahy Scale: Fair Standing balance comment: pt able to wash hands at sink without difficulty                            Cognition Arousal/Alertness: Awake/alert Behavior During Therapy: WFL for tasks assessed/performed Overall Cognitive Status: Within Functional Limits for tasks assessed                                 General Comments: spouse anxious      Exercises      General Comments General comments (skin integrity, edema, etc.): pt assisted to the bathroom, pt able to perform hygiene without assist      Pertinent Vitals/Pain Pain Assessment: 0-10 Pain Score: 2  Pain Location: back Pain Descriptors / Indicators: Operative site guarding Pain Intervention(s): Monitored during session    Home Living  Prior Function            PT Goals (current goals can now be found in the care plan section) Acute Rehab PT Goals Patient Stated Goal: decrease pain, return home Progress towards PT goals: Progressing toward goals    Frequency    Min 5X/week      PT Plan Current plan remains appropriate    Co-evaluation              AM-PAC PT "6 Clicks" Daily Activity  Outcome Measure  Difficulty  turning over in bed (including adjusting bedclothes, sheets and blankets)?: None Difficulty moving from lying on back to sitting on the side of the bed? : None Difficulty sitting down on and standing up from a chair with arms (e.g., wheelchair, bedside commode, etc,.)?: None Help needed moving to and from a bed to chair (including a wheelchair)?: None Help needed walking in hospital room?: A Little Help needed climbing 3-5 steps with a railing? : A Little 6 Click Score: 22    End of Session Equipment Utilized During Treatment: Gait belt;Back brace Activity Tolerance: Patient tolerated treatment well Patient left: in chair;with call bell/phone within reach;with family/visitor present Nurse Communication: Mobility status PT Visit Diagnosis: Difficulty in walking, not elsewhere classified (R26.2);Other symptoms and signs involving the nervous system (R29.898);Pain     Time: 1540-0867 PT Time Calculation (min) (ACUTE ONLY): 33 min  Charges:  $Gait Training: 8-22 mins $Therapeutic Activity: 8-22 mins                    G Codes:       Kittie Plater, PT, DPT Pager #: [phone] Office #: [phone]    [place] 01/14/2017, 12:13 PM

## 2017-01-14 NOTE — Discharge Summary (Signed)
Physician Discharge Summary  Patient ID: Carolyn Graham MRN: [id number] DOB/AGE: [birth year] 61 y.o.  Admit date: 01/12/2017 Discharge date: 01/14/2017  Admission Diagnoses: Spondylolisthesis  Discharge Diagnoses: Same Active Problems:   Spondylolisthesis of lumbar region   Discharged Condition: good  Hospital Course: Patient admitted hospital underwent decompression stabilization procedure. Postoperatively patient did very well recovered before the floor was angling and voiding spontaneously tolerating regular diet stable for discharge home postop day 2. Patient be discharged her scheduled follow up in 2 weeks and a rolling walker.  Consults: Significant Diagnostic Studies: Treatments: Decompression stabilization procedure lumbar Discharge Exam: Blood pressure 125/64, pulse 87, temperature 99.2 F (37.3 C), temperature source Oral, resp. rate 20, height 5' 0.98" (1.549 m), weight 94.8 kg (209 lb), SpO2 93 %. Strength 5 out of 5 wound clean dry and intact  Disposition: Home  Discharge Instructions    Call MD for:  redness, tenderness, or signs of infection (pain, swelling, redness, odor or green/yellow discharge around incision site)   Complete by:  As directed    Call MD for:  severe uncontrolled pain   Complete by:  As directed    Call MD for:  temperature >100.4   Complete by:  As directed    Diet - low sodium heart healthy   Complete by:  As directed    Diet - low sodium heart healthy   Complete by:  As directed    Discharge instructions   Complete by:  As directed    Okay to shower. Do not apply salves or appointments to incision. No heavy lifting with the upper extremities greater than 15 pounds. May resume driving when not requiring pain medication and patient feels comfortable with doing so.   Incentive spirometry RT   Complete by:  As directed    Increase activity slowly   Complete by:  As directed    Increase activity slowly   Complete by:  As directed    Walker rolling   Complete by:  As directed      Allergies as of 01/14/2017      Reactions   Codeine Nausea And Vomiting   Darvocet [propoxyphene N-acetaminophen] Nausea Only      Medication List    TAKE these medications   alendronate 70 MG tablet Commonly known as:  FOSAMAX Take 70 mg every Saturday by mouth. Take with a full glass of water on an empty stomach.   aspirin 81 MG tablet Take 81 mg by mouth daily.   bisoprolol-hydrochlorothiazide 2.5-6.25 MG tablet Commonly known as:  ZIAC Take 1 tablet daily by mouth.   CALCIUM 600+D PO Take 1 tablet daily by mouth.   diclofenac sodium 1 % Gel Commonly known as:  VOLTAREN APPLY 3 GRAMS TO 3 LARGE JOINTS THREE TIMES DAILY AS NEEDED. What changed:  See the new instructions.   DULoxetine 30 MG capsule Commonly known as:  CYMBALTA Take 1 capsule (30 mg total) by mouth daily.   IMITREX 100 MG tablet Generic drug:  SUMAtriptan Take 100 mg every 2 (two) hours as needed by mouth for migraine or headache.   MAGNESIUM ASPARTATE PO Take 600 mg by mouth daily.   meloxicam 15 MG tablet Commonly known as:  MOBIC TAKE 1 TABLET BY MOUTH EVERY DAY AS NEEDED FOR PAIN What changed:    how much to take  how to take this  when to take this  additional instructions   methocarbamol 500 MG tablet Commonly known as:  ROBAXIN Take 1 tablet (  500 mg total) by mouth every 6 (six) hours as needed for muscle spasms.   omeprazole 20 MG capsule Commonly known as:  PRILOSEC Take 20 mg by mouth daily   Oxycodone HCl 10 MG Tabs Take 1 tablet (10 mg total) by mouth every 3 (three) hours as needed for severe pain ((score 7 to 10)).   pramipexole 0.5 MG tablet Commonly known as:  MIRAPEX Take 1.5 mg by mouth 90 minutes before bedtime   rosuvastatin 10 MG tablet Commonly known as:  CRESTOR Take 10 mg daily by mouth.   tiZANidine 4 MG capsule Commonly known as:  ZANAFLEX Take 1 capsule (4 mg total) by mouth at bedtime as needed for  muscle spasms.   traMADol 50 MG tablet Commonly known as:  ULTRAM Take 1-2 tablets (50-100 mg total) by mouth 2 (two) times daily as needed. What changed:  reasons to take this   valACYclovir 1000 MG tablet Commonly known as:  VALTREX Take 1,000 mg daily as needed by mouth (for fever blisters).   vitamin B-12 1000 MCG tablet Commonly known as:  CYANOCOBALAMIN Take 1,000 mcg by mouth daily.   Vitamin D 2000 units Caps Take 2,000 Units daily by mouth.   zolpidem 10 MG tablet Commonly known as:  AMBIEN Take 1 tablet (10 mg total) by mouth at bedtime.        Signed: Vinaya Sancho P 01/14/2017, 8:57 AM

## 2017-01-15 ENCOUNTER — Other Ambulatory Visit: Payer: Self-pay | Admitting: Rheumatology

## 2017-01-18 NOTE — Telephone Encounter (Signed)
Last Visit: 12/17/16 Next Visit: 06/21/17  Okay to refill per Dr. Estanislado Pandy

## 2017-01-25 ENCOUNTER — Other Ambulatory Visit: Payer: Self-pay | Admitting: Rheumatology

## 2017-01-25 NOTE — Telephone Encounter (Signed)
Last Visit: 12/17/16  Next Visit: 06/21/17  Patient is getting Zanaflex and Baclofen from [place] and Methocarbamol from Dr. Ellene Route. Patient is also receiving Tramadol from [place] and Oxycodone from Dr. Ellene Route. She had a Lumbar surgery on 01/12/17.   Do you want to refill the Zanaflex?

## 2017-01-25 NOTE — Telephone Encounter (Signed)
Please call to check if patient is taking methocarbamol 3 times a day. If she is on 3 times a day dosing then she would not be able to take Zanaflex at bedtime. If she is using methocarbamol a.m. and known that she should be able to add Zanaflex at bedtime.

## 2017-01-26 NOTE — Telephone Encounter (Signed)
Patient states she is having this prescription filled by Dr. Ellene Route with different directions and is no longer taking the Methocarbamol.

## 2017-03-09 ENCOUNTER — Other Ambulatory Visit: Payer: Self-pay | Admitting: *Deleted

## 2017-03-09 MED ORDER — TIZANIDINE HCL 4 MG PO CAPS: 4.0000 mg | ORAL_CAPSULE | Freq: Every evening | ORAL | 1 refills | Status: DC | PRN

## 2017-03-09 NOTE — Telephone Encounter (Signed)
Refill request received via fax  Last Visit: 12/17/16  Next Visit: 06/21/17  Okay to refill per Dr. Estanislado Pandy

## 2017-06-07 NOTE — Progress Notes (Deleted)
Office Visit Note  Patient: Carolyn Graham             Date of Birth: 1955/03/26           MRN: [id number]             PCP: Juluis Pitch, MD Referring: Juluis Pitch, MD Visit Date: 06/21/2017 Occupation: @GUAROCC @    Subjective:  No chief complaint on file.   History of Present Illness: Carolyn Graham is a 62 y.o. female ***   Activities of Daily Living:  Patient reports morning stiffness for *** {minute/hour:19697}.   Patient {ACTIONS;DENIES/REPORTS:21021675::"Denies"} nocturnal pain.  Difficulty dressing/grooming: {ACTIONS;DENIES/REPORTS:21021675::"Denies"} Difficulty climbing stairs: {ACTIONS;DENIES/REPORTS:21021675::"Denies"} Difficulty getting out of chair: {ACTIONS;DENIES/REPORTS:21021675::"Denies"} Difficulty using hands for taps, buttons, cutlery, and/or writing: {ACTIONS;DENIES/REPORTS:21021675::"Denies"}   No Rheumatology ROS completed.   PMFS History:  Patient Active Problem List   Diagnosis Date Noted  . Spondylolisthesis of lumbar region 01/12/2017  . Benign essential hypertension 05/06/2016  . Oral herpes 05/06/2016  . Carpal tunnel syndrome of left wrist 11/08/2015  . Hyperlipidemia, unspecified 05/10/2015  . Osteopenia 11/05/2014  . Closed fracture of fibula 01/09/2014  . Sleep apnea 01/06/2011  . Restless leg syndrome 01/06/2011  . Insomnia 01/06/2011  . Fibromyalgia 01/06/2011  . GERD (gastroesophageal reflux disease) 12/15/2007    Past Medical History:  Diagnosis Date  . Arthritis   . Broken ankle 11/2013   right ankle  . Cataract    Bil/ surgery on right eye  . Fatty liver   . Fibromyalgia 2005  . GERD (gastroesophageal reflux disease)    uses prilosec  . Headache(784.0)    not as often as before  . Hypercholesteremia   . Hypertension   . Hyperthyroidism   . OSA (obstructive sleep apnea)    CPAP  . Plantar fasciitis   . Post-operative nausea and vomiting   . Restless leg syndrome   . Schatzki's ring   . Spondylolisthesis of  lumbar region   . Stress incontinence   . Stroke [facility]) 2015   TIA.  unable to determine if this is true.  . Thyroiditis   . Vertigo     Family History  Problem Relation Age of Onset  . Colon cancer Mother 60  . Lung cancer Father 30  . Colon cancer Maternal Grandfather   . Breast cancer Neg Hx    Past Surgical History:  Procedure Laterality Date  . CARPAL TUNNEL RELEASE Left 12/18/2015   Procedure: CARPAL TUNNEL RELEASE;  Surgeon: Leanor Kail, MD;  Location: [facility] ORS;  Service: Orthopedics;  Laterality: Left;  . CATARACT EXTRACTION W/PHACO Right 11/13/2015   Procedure: CATARACT EXTRACTION PHACO AND INTRAOCULAR LENS PLACEMENT (IOC);  Surgeon: Estill Cotta, MD;  Location: [facility] ORS;  Service: Ophthalmology;  Laterality: Right;  [place] 01:08AP% 21.6CDE 26.99Fluid pack lot # [id number]  . CATARACT EXTRACTION W/PHACO Left 12/04/2015   Procedure: CATARACT EXTRACTION PHACO AND INTRAOCULAR LENS PLACEMENT (IOC);  Surgeon: Estill Cotta, MD;  Location: [facility] ORS;  Service: Ophthalmology;  Laterality: Left;  [place] 59.2 AP% 24.4 CDE 29.2 Fluid Pack lot # [id number] H  . CERVICAL DISC SURGERY     cervical decompression c4-5 and c5-6  . COLONOSCOPY W/ BIOPSIES AND POLYPECTOMY    . CTR     right hand  . LUMBAR SPINE SURGERY  2011   2 3 & 4  . SHOULDER SURGERY     right shoulder  . TRIGGER FINGER RELEASE     right finger  . WISDOM TOOTH EXTRACTION  Social History   Social History Narrative   Married to [place], no natural children   Right handed   Bachelor's degree   2-3 cups daily           Objective: Vital Signs: There were no vitals taken for this visit.   Physical Exam   Musculoskeletal Exam: ***  CDAI Exam: No CDAI exam completed.    Investigation: No additional findings.   Imaging: No results found.  Speciality Comments: No specialty comments available.    Procedures:  No procedures performed Allergies: Codeine and Darvocet [propoxyphene  n-acetaminophen]   Assessment / Plan:     Visit Diagnoses: No diagnosis found.    Orders: No orders of the defined types were placed in this encounter.  No orders of the defined types were placed in this encounter.   Face-to-face time spent with patient was *** minutes. 50% of time was spent in counseling and coordination of care.  Follow-Up Instructions: No follow-ups on file.   Earnestine Mealing, CMA  Note - This record has been created using Editor, commissioning.  Chart creation errors have been sought, but may not always  have been located. Such creation errors do not reflect on  the standard of medical care.

## 2017-06-21 ENCOUNTER — Ambulatory Visit: Payer: No Typology Code available for payment source | Admitting: Rheumatology

## 2017-07-28 ENCOUNTER — Other Ambulatory Visit: Payer: Self-pay | Admitting: Rheumatology

## 2017-07-30 ENCOUNTER — Other Ambulatory Visit: Payer: Self-pay | Admitting: Rheumatology

## 2017-07-30 MED ORDER — ZOLPIDEM TARTRATE 10 MG PO TABS: 10.0000 mg | ORAL_TABLET | Freq: Every day | ORAL | 0 refills | Status: DC

## 2017-07-30 NOTE — Telephone Encounter (Signed)
Patient called requesting prescription refill of Zolpidem to be sent to [facility] on [address] in [place].  Patient states she will be in the office on 6/12 to do her labwork.

## 2017-07-30 NOTE — Telephone Encounter (Signed)
ok 

## 2017-07-30 NOTE — Telephone Encounter (Signed)
Last Visit: 12/17/16  Next Visit: 08/30/17  Okay to refill Ambien?

## 2017-08-04 ENCOUNTER — Other Ambulatory Visit: Payer: Self-pay | Admitting: *Deleted

## 2017-08-04 DIAGNOSIS — Z79899 Other long term (current) drug therapy: Secondary | ICD-10-CM

## 2017-08-04 DIAGNOSIS — Z5181 Encounter for therapeutic drug level monitoring: Secondary | ICD-10-CM

## 2017-08-05 ENCOUNTER — Other Ambulatory Visit: Payer: Self-pay | Admitting: *Deleted

## 2017-08-05 MED ORDER — TRAMADOL HCL 50 MG PO TABS: 50.0000 mg | ORAL_TABLET | Freq: Two times a day (BID) | ORAL | 0 refills | Status: DC | PRN

## 2017-08-05 NOTE — Telephone Encounter (Signed)
Last Visit: 12/17/16  Next Visit: 08/30/17 UDS: 08/04/17 NArc Agreement: 08/04/17   Verbal authorization given per Dr. Estanislado Pandy

## 2017-08-05 NOTE — Progress Notes (Signed)
Discontinue Mobic , all NSAIDS and alcohol. Elevated LFTs.

## 2017-08-08 LAB — CBC WITH DIFFERENTIAL/PLATELET
BASOS ABS: 82 {cells}/uL (ref 0–200)
Basophils Relative: 1.2 %
EOS PCT: 3.1 %
Eosinophils Absolute: 211 cells/uL (ref 15–500)
HEMATOCRIT: 40 % (ref 35.0–45.0)
Hemoglobin: 12.5 g/dL (ref 11.7–15.5)
Lymphs Abs: 1292 cells/uL (ref 850–3900)
MCH: 23.6 pg — ABNORMAL LOW (ref 27.0–33.0)
MCHC: 31.3 g/dL — AB (ref 32.0–36.0)
MCV: 75.6 fL — ABNORMAL LOW (ref 80.0–100.0)
MONOS PCT: 13.8 %
MPV: 10.1 fL (ref 7.5–12.5)
NEUTROS PCT: 62.9 %
Neutro Abs: 4277 cells/uL (ref 1500–7800)
PLATELETS: 257 10*3/uL (ref 140–400)
RBC: 5.29 10*6/uL — AB (ref 3.80–5.10)
RDW: 16.9 % — AB (ref 11.0–15.0)
TOTAL LYMPHOCYTE: 19 %
WBC mixed population: 938 cells/uL (ref 200–950)
WBC: 6.8 10*3/uL (ref 3.8–10.8)

## 2017-08-08 LAB — COMPLETE METABOLIC PANEL WITH GFR
AG RATIO: 1.4 (calc) (ref 1.0–2.5)
ALBUMIN MSPROF: 4.1 g/dL (ref 3.6–5.1)
ALKALINE PHOSPHATASE (APISO): 86 U/L (ref 33–130)
ALT: 75 U/L — ABNORMAL HIGH (ref 6–29)
AST: 58 U/L — ABNORMAL HIGH (ref 10–35)
BILIRUBIN TOTAL: 0.3 mg/dL (ref 0.2–1.2)
BUN: 16 mg/dL (ref 7–25)
CHLORIDE: 104 mmol/L (ref 98–110)
CO2: 29 mmol/L (ref 20–32)
Calcium: 9.4 mg/dL (ref 8.6–10.4)
Creat: 0.83 mg/dL (ref 0.50–0.99)
GFR, EST AFRICAN AMERICAN: 89 mL/min/{1.73_m2} (ref >=60)
GFR, Est Non African American: 77 mL/min/{1.73_m2} (ref >=60)
GLUCOSE: 111 mg/dL — AB (ref 65–99)
Globulin: 3 g/dL (calc) (ref 1.9–3.7)
POTASSIUM: 4.2 mmol/L (ref 3.5–5.3)
Sodium: 139 mmol/L (ref 135–146)
TOTAL PROTEIN: 7.1 g/dL (ref 6.1–8.1)

## 2017-08-08 LAB — PAIN MGMT, PROFILE 5 W/CONF, U
AMPHETAMINES: NEGATIVE ng/mL (ref <500)
BARBITURATES: NEGATIVE ng/mL (ref <300)
BENZODIAZEPINES: NEGATIVE ng/mL (ref <100)
COCAINE METABOLITE: NEGATIVE ng/mL (ref <150)
Creatinine: 122.5 mg/dL
METHADONE METABOLITE: NEGATIVE ng/mL (ref <100)
Marijuana Metabolite: NEGATIVE ng/mL (ref <20)
OPIATES: NEGATIVE ng/mL (ref <100)
Oxidant: NEGATIVE ug/mL (ref <200)
Oxycodone: NEGATIVE ng/mL (ref <100)
pH: 6.86 (ref 4.5–9.0)

## 2017-08-08 LAB — PAIN MGMT, TRAMADOL W/MEDMATCH, U
Desmethyltramadol: NEGATIVE ng/mL (ref <100)
Tramadol: NEGATIVE ng/mL (ref <100)

## 2017-08-09 NOTE — Progress Notes (Signed)
C/w

## 2017-08-16 NOTE — Progress Notes (Signed)
Office Visit Note  Patient: Carolyn Graham             Date of Birth: 08-19-1955           MRN: [id number]             PCP: Juluis Pitch, MD Referring: Juluis Pitch, MD Visit Date: 08/30/2017 Occupation: @GUAROCC @    Subjective:  Generalized pain.   History of Present Illness: Carolyn Graham is a 62 y.o. female history of fibromyalgia, osteoarthritis and DDD.  She states she had lumbar spine fusion in November 2018 by Dr. Ellene Route.  Her lower back pain is better although she does continue to have some muscle spasms.  Her neck pain is tolerable.  She is complaining of some discomfort in her left knee over the medial aspect.  She continues to have generalized pain from fibromyalgia.  She has chronic insomnia for which she is taking Ambien 10 mg p.o. nightly.  She is also taking muscle relaxers for muscle spasms.  She states she is taking tramadol 5 mg p.o. twice daily as needed.  Patient reports that she had a tick bite 2 years ago.  She had another tick bite recently for which she was given antibiotics by her PCP.  She wants to be tested for tick-induced disease.  Activities of Daily Living:  Patient reports morning stiffness for 10 minutes.   Patient Reports nocturnal pain.  Difficulty dressing/grooming: Denies Difficulty climbing stairs: Reports Difficulty getting out of chair: Denies Difficulty using hands for taps, buttons, cutlery, and/or writing: Denies   Review of Systems  Constitutional: Positive for fatigue. Negative for night sweats, weight gain and weight loss.  HENT: Negative for mouth sores, trouble swallowing, trouble swallowing, mouth dryness and nose dryness.   Eyes: Negative for pain, redness, visual disturbance and dryness.  Respiratory: Negative for cough, shortness of breath and difficulty breathing.   Cardiovascular: Negative for chest pain, palpitations, hypertension, irregular heartbeat and swelling in legs/feet.  Gastrointestinal: Negative for blood in  stool, constipation and diarrhea.  Endocrine: Negative for increased urination.  Genitourinary: Negative for vaginal dryness.  Musculoskeletal: Positive for arthralgias, joint pain, myalgias, morning stiffness and myalgias. Negative for joint swelling, muscle weakness and muscle tenderness.  Skin: Negative for color change, rash, hair loss, skin tightness, ulcers and sensitivity to sunlight.  Allergic/Immunologic: Negative for susceptible to infections.  Neurological: Negative for dizziness, memory loss, night sweats and weakness.  Hematological: Negative for swollen glands.  Psychiatric/Behavioral: Positive for depressed mood and sleep disturbance. The patient is nervous/anxious.     PMFS History:  Patient Active Problem List   Diagnosis Date Noted  . Primary osteoarthritis of both hands 08/30/2017  . Primary osteoarthritis of both knees 08/30/2017  . DDD (degenerative disc disease), cervical 08/30/2017  . DDD (degenerative disc disease), lumbar 08/30/2017  . History of insomnia 08/30/2017  . Fatty liver 08/30/2017  . History of TIA (transient ischemic attack) 08/30/2017  . S/p bilateral carpal tunnel release 08/30/2017  . History of gastroesophageal reflux (GERD) 08/30/2017  . History of vitamin D deficiency 08/30/2017  . Spondylolisthesis of lumbar region 01/12/2017  . Benign essential hypertension 05/06/2016  . Oral herpes 05/06/2016  . Carpal tunnel syndrome of left wrist 11/08/2015  . Hyperlipidemia, unspecified 05/10/2015  . Osteopenia 11/05/2014  . Closed fracture of fibula 01/09/2014  . Sleep apnea 01/06/2011  . Restless leg syndrome 01/06/2011  . Insomnia 01/06/2011  . Fibromyalgia 01/06/2011  . GERD (gastroesophageal reflux disease) 12/15/2007  Past Medical History:  Diagnosis Date  . Arthritis   . Broken ankle 11/2013   right ankle  . Cataract    Bil/ surgery on right eye  . Fatty liver   . Fibromyalgia 2005  . GERD (gastroesophageal reflux disease)     uses prilosec  . Headache(784.0)    not as often as before  . Hypercholesteremia   . Hypertension   . Hyperthyroidism   . OSA (obstructive sleep apnea)    CPAP  . Plantar fasciitis   . Post-operative nausea and vomiting   . Restless leg syndrome   . Schatzki's ring   . Spondylolisthesis of lumbar region   . Stress incontinence   . Stroke [facility] 2015   TIA.  unable to determine if this is true.  . Thyroiditis   . Vertigo     Family History  Problem Relation Age of Onset  . Colon cancer Mother 54  . Lung cancer Father 50  . Colon cancer Maternal Grandfather   . Breast cancer Neg Hx    Past Surgical History:  Procedure Laterality Date  . CARPAL TUNNEL RELEASE Left 12/18/2015   Procedure: CARPAL TUNNEL RELEASE;  Surgeon: Leanor Kail, MD;  Location: [facility] ORS;  Service: Orthopedics;  Laterality: Left;  . CARPAL TUNNEL RELEASE Right 2018  . CATARACT EXTRACTION W/PHACO Right 11/13/2015   Procedure: CATARACT EXTRACTION PHACO AND INTRAOCULAR LENS PLACEMENT (IOC);  Surgeon: Estill Cotta, MD;  Location: [facility] ORS;  Service: Ophthalmology;  Laterality: Right;  [place] 01:08AP% 21.6CDE 26.99Fluid pack lot # [id number] H  . CATARACT EXTRACTION W/PHACO Left 12/04/2015   Procedure: CATARACT EXTRACTION PHACO AND INTRAOCULAR LENS PLACEMENT (IOC);  Surgeon: Estill Cotta, MD;  Location: [facility] ORS;  Service: Ophthalmology;  Laterality: Left;  [place] 59.2 AP% 24.4 CDE 29.2 Fluid Pack lot # [id number] H  . CERVICAL DISC SURGERY     cervical decompression c4-5 and c5-6  . COLONOSCOPY W/ BIOPSIES AND POLYPECTOMY    . CTR     right hand  . EYE SURGERY Bilateral    cataract   . LUMBAR SPINE SURGERY  2018  . SHOULDER SURGERY     right shoulder  . TRIGGER FINGER RELEASE     right finger  . WISDOM TOOTH EXTRACTION     Social History   Social History Narrative   Married to [place], no natural children   Right handed   Bachelor's degree   2-3 cups daily           Objective: Vital Signs: BP  116/77 (BP Location: Left Arm, Patient Position: Sitting, Cuff Size: Normal)   Pulse 69   Resp 17   Ht 5\' 1"  (1.549 m)   Wt 217 lb (98.4 kg)   BMI 41.00 kg/m    Physical Exam  Constitutional: She is oriented to person, place, and time. She appears well-developed and well-nourished.  HENT:  Head: Normocephalic and atraumatic.  Eyes: Conjunctivae and EOM are normal.  Neck: Normal range of motion.  Cardiovascular: Normal rate, regular rhythm, normal heart sounds and intact distal pulses.  Pulmonary/Chest: Effort normal and breath sounds normal.  Abdominal: Soft. Bowel sounds are normal.  Lymphadenopathy:    She has no cervical adenopathy.  Neurological: She is alert and oriented to person, place, and time.  Skin: Skin is warm and dry. Capillary refill takes less than 2 seconds.  Psychiatric: She has a normal mood and affect. Her behavior is normal.  Nursing note and vitals reviewed.    Musculoskeletal Exam: C-spine  limited range of motion.  Thoracic and lumbar spine limited range of motion.  Shoulder joints elbow joints were in good range of motion.  She has DIP PIP thickening in her hands and feet consistent with osteoarthritis.  She is crepitus in her knee joints without any warmth swelling or effusion.  She has generalized myalgia and hyperalgesia.  CDAI Exam: No CDAI exam completed.    Investigation: No additional findings. CBC Latest Ref Rng & Units 08/04/2017 01/13/2017 01/07/2017  WBC 3.8 - 10.8 Thousand/uL 6.8 13.4(H) 8.0  Hemoglobin 11.7 - 15.5 g/dL 12.5 10.1(L) 15.0  Hematocrit 35.0 - 45.0 % 40.0 31.8(L) 45.0  Platelets 140 - 400 Thousand/uL 257 207 245   CMP Latest Ref Rng & Units 08/04/2017 01/13/2017 01/07/2017  Glucose 65 - 99 mg/dL 111(H) 143(H) 100(H)  BUN 7 - 25 mg/dL 16 11 15   Creatinine 0.50 - 0.99 mg/dL 0.83 0.91 0.91  Sodium 135 - 146 mmol/L 139 138 141  Potassium 3.5 - 5.3 mmol/L 4.2 3.7 4.0  Chloride 98 - 110 mmol/L 104 106 106  CO2 20 - 32 mmol/L 29 25  28   Calcium 8.6 - 10.4 mg/dL 9.4 8.4(L) 9.4  Total Protein 6.1 - 8.1 g/dL 7.1 - 7.3  Total Bilirubin 0.2 - 1.2 mg/dL 0.3 - 0.6  Alkaline Phos 38 - 126 U/L - - 70  AST 10 - 35 U/L 58(H) - 37  ALT 6 - 29 U/L 75(H) - 45    Imaging: No results found.  Speciality Comments: No specialty comments available.    Procedures:  No procedures performed Allergies: Codeine and Darvocet [propoxyphene n-acetaminophen]   Assessment / Plan:     Visit Diagnoses: Fibromyalgia-patient continues to have some generalized pain and discomfort.  She does have hyperalgesia.  She has been using muscle relaxers and pain medications to manage her discomfort.  She uses tramadol on PRN basis only.  Primary osteoarthritis of both hands-joint protection muscle strengthening was discussed.  Primary osteoarthritis of both knees-weight loss diet and exercise was discussed.  I have encouraged her to do water aerobics.  DDD (degenerative disc disease), cervical - S/P fusion-doing better.  DDD (degenerative disc disease), lumbar - L3-4, L4-5 fusion January 22, 2017 by Dr. Ellene Route.  She continues to have some lower back discomfort.  History of insomnia  Pain management - tramadol 50 mg po bid prnUDS: 6/12/2019Narc agreement: 08/04/2017  Fatty liver-her LFTs continue to be elevated.  She states the statins contributed to her LFTs as well.  History of TIA (transient ischemic attack) - questionable, 2015.  History of migraine  History of hypertension  History of vitamin D deficiency  History of sleep apnea - CPAP  History of gastroesophageal reflux (GERD)  RLS (restless legs syndrome)  History of shoulder surgery - right   S/p bilateral carpal tunnel release  Tick bite, subsequent encounter -patient had recent tick bite and also has polyarthralgia.  She is concerned about possible Lyme and RMSF.  I will obtain the labs per her concern.  Plan: B. burgdorfi antibodies, Rocky mtn spotted fvr abs  pnl(IgG+IgM)  Polyarthralgia -she has been experiencing increased joint pain for the last few months.   Orders: Orders Placed This Encounter  Procedures  . B. burgdorfi antibodies  . Rocky mtn spotted fvr abs pnl(IgG+IgM)   Meds ordered this encounter  Medications  . tiZANidine (ZANAFLEX) 4 MG capsule    Sig: Take 1 capsule (4 mg total) by mouth at bedtime as needed for muscle spasms.  Dispense:  90 capsule    Refill:  1    Face-to-face time spent with patient was 30 minutes. Greater than 50% of time was spent in counseling and coordination of care.  Follow-Up Instructions: Return in about 6 months (around 03/02/2018) for FMS,OA, DDD.   Bo Merino, MD  Note - This record has been created using Editor, commissioning.  Chart creation errors have been sought, but may not always  have been located. Such creation errors do not reflect on  the standard of medical care.

## 2017-08-18 ENCOUNTER — Other Ambulatory Visit: Payer: Self-pay | Admitting: Rheumatology

## 2017-08-18 NOTE — Telephone Encounter (Signed)
Last Visit: 12/17/16  Next Visit: 08/30/17  Okay to refill per Dr. Estanislado Pandy

## 2017-08-19 ENCOUNTER — Telehealth: Payer: Self-pay | Admitting: Radiology

## 2017-08-19 NOTE — Telephone Encounter (Signed)
Notified pt tramadol was approved until 02/15/18

## 2017-08-20 NOTE — Telephone Encounter (Signed)
ERROR

## 2017-08-30 ENCOUNTER — Ambulatory Visit: Payer: No Typology Code available for payment source | Admitting: Rheumatology

## 2017-08-30 ENCOUNTER — Encounter: Payer: Self-pay | Admitting: Rheumatology

## 2017-08-30 VITALS — BP 116/77 | HR 69 | Resp 17 | Ht 61.0 in | Wt 217.0 lb

## 2017-08-30 DIAGNOSIS — Z8669 Personal history of other diseases of the nervous system and sense organs: Secondary | ICD-10-CM

## 2017-08-30 DIAGNOSIS — M2550 Pain in unspecified joint: Secondary | ICD-10-CM

## 2017-08-30 DIAGNOSIS — M19042 Primary osteoarthritis, left hand: Secondary | ICD-10-CM

## 2017-08-30 DIAGNOSIS — M170 Bilateral primary osteoarthritis of knee: Secondary | ICD-10-CM | POA: Diagnosis not present

## 2017-08-30 DIAGNOSIS — M19041 Primary osteoarthritis, right hand: Secondary | ICD-10-CM | POA: Diagnosis not present

## 2017-08-30 DIAGNOSIS — G2581 Restless legs syndrome: Secondary | ICD-10-CM

## 2017-08-30 DIAGNOSIS — M5030 Other cervical disc degeneration, unspecified cervical region: Secondary | ICD-10-CM | POA: Diagnosis not present

## 2017-08-30 DIAGNOSIS — M5136 Other intervertebral disc degeneration, lumbar region: Secondary | ICD-10-CM | POA: Insufficient documentation

## 2017-08-30 DIAGNOSIS — K760 Fatty (change of) liver, not elsewhere classified: Secondary | ICD-10-CM | POA: Insufficient documentation

## 2017-08-30 DIAGNOSIS — Z8679 Personal history of other diseases of the circulatory system: Secondary | ICD-10-CM | POA: Diagnosis not present

## 2017-08-30 DIAGNOSIS — Z9889 Other specified postprocedural states: Secondary | ICD-10-CM

## 2017-08-30 DIAGNOSIS — Z8639 Personal history of other endocrine, nutritional and metabolic disease: Secondary | ICD-10-CM | POA: Diagnosis not present

## 2017-08-30 DIAGNOSIS — Z8673 Personal history of transient ischemic attack (TIA), and cerebral infarction without residual deficits: Secondary | ICD-10-CM | POA: Diagnosis not present

## 2017-08-30 DIAGNOSIS — R52 Pain, unspecified: Secondary | ICD-10-CM

## 2017-08-30 DIAGNOSIS — Z87898 Personal history of other specified conditions: Secondary | ICD-10-CM

## 2017-08-30 DIAGNOSIS — M797 Fibromyalgia: Secondary | ICD-10-CM | POA: Diagnosis not present

## 2017-08-30 DIAGNOSIS — Z8719 Personal history of other diseases of the digestive system: Secondary | ICD-10-CM | POA: Insufficient documentation

## 2017-08-30 DIAGNOSIS — W57XXXD Bitten or stung by nonvenomous insect and other nonvenomous arthropods, subsequent encounter: Secondary | ICD-10-CM

## 2017-08-30 MED ORDER — TIZANIDINE HCL 4 MG PO CAPS: 4.0000 mg | ORAL_CAPSULE | Freq: Every evening | ORAL | 1 refills | Status: DC | PRN

## 2017-08-31 ENCOUNTER — Telehealth: Payer: Self-pay | Admitting: Rheumatology

## 2017-08-31 ENCOUNTER — Other Ambulatory Visit: Payer: Self-pay | Admitting: Rheumatology

## 2017-08-31 LAB — ROCKY MTN SPOTTED FVR ABS PNL(IGG+IGM)
RMSF IGG: NOT DETECTED
RMSF IgM: NOT DETECTED

## 2017-08-31 LAB — B. BURGDORFI ANTIBODIES: B burgdorferi Ab IgG+IgM: <0.9 index

## 2017-08-31 MED ORDER — ZOLPIDEM TARTRATE 10 MG PO TABS: 10.0000 mg | ORAL_TABLET | Freq: Every day | ORAL | 0 refills | Status: DC

## 2017-08-31 NOTE — Telephone Encounter (Signed)
ok 

## 2017-08-31 NOTE — Telephone Encounter (Signed)
Patient called requesting prescription refill of Zolpidem to be sent to [facility] on [address] in [place].  Patient states she had an appointment on 08/30/17 with Dr. Estanislado Pandy.  Patient states her prescription was last filled on 07/30/17 with no refills.

## 2017-08-31 NOTE — Telephone Encounter (Signed)
Last Visit: 08/30/17 Next Visit: 03/02/18  Okay to refill Ambien? 

## 2017-09-29 ENCOUNTER — Other Ambulatory Visit: Payer: Self-pay | Admitting: *Deleted

## 2017-09-29 MED ORDER — ZOLPIDEM TARTRATE 10 MG PO TABS: 10.0000 mg | ORAL_TABLET | Freq: Every day | ORAL | 0 refills | Status: DC

## 2017-09-29 NOTE — Telephone Encounter (Signed)
ok 

## 2017-09-29 NOTE — Telephone Encounter (Signed)
Refill request received via fax  Last Visit: 08/30/17 Next Visit: 03/02/18  Okay to refill Ambien? 

## 2017-11-01 ENCOUNTER — Other Ambulatory Visit: Payer: Self-pay | Admitting: *Deleted

## 2017-11-01 ENCOUNTER — Telehealth: Payer: Self-pay | Admitting: Rheumatology

## 2017-11-01 MED ORDER — ZOLPIDEM TARTRATE 10 MG PO TABS: 10.0000 mg | ORAL_TABLET | Freq: Every day | ORAL | 0 refills | Status: DC

## 2017-11-01 NOTE — Telephone Encounter (Signed)
Prescription faxed to the pharmacy.

## 2017-11-01 NOTE — Telephone Encounter (Signed)
ok 

## 2017-11-01 NOTE — Telephone Encounter (Signed)
Refill request received via fax  Last Visit: 08/30/17 Next Visit: 03/02/18  Okay to refill Ambien? 

## 2017-11-01 NOTE — Telephone Encounter (Signed)
Patient left a voicemail requesting prescription refill of Zolpidem to be sent to [facility] on [address] in [place].  Patient stated that she is out of medication.

## 2017-11-10 ENCOUNTER — Ambulatory Visit: Payer: No Typology Code available for payment source | Admitting: Podiatry

## 2017-11-10 ENCOUNTER — Encounter: Payer: Self-pay | Admitting: Podiatry

## 2017-11-10 DIAGNOSIS — M722 Plantar fascial fibromatosis: Secondary | ICD-10-CM

## 2017-11-10 NOTE — Progress Notes (Signed)
She presents today for follow-up of her bilateral plantar fasciitis states that it hurts again right greater than the left in my feet have been swelling since my back surgery November 20 of 2018.  Objective: Vital signs are stable she is alert and oriented x3.  Pulses are palpable.  Neurologic sensorium is intact degenerative flexors are intact muscle strength is normal symmetrical.  Orthopedic evaluation demonstrates pain on palpation medial calcaneal tubercles bilateral.  Assessment: Plantar fasciitis bilateral.  Plan: After sterile Betadine skin prep I injected 20 mg Kenalog 5 mg Marcaine point maximal tenderness bilateral heels.

## 2017-11-30 ENCOUNTER — Telehealth: Payer: Self-pay | Admitting: Rheumatology

## 2017-11-30 MED ORDER — ZOLPIDEM TARTRATE 10 MG PO TABS: 10.0000 mg | ORAL_TABLET | Freq: Every day | ORAL | 0 refills | Status: DC

## 2017-11-30 NOTE — Telephone Encounter (Signed)
Last Visit: 08/30/17 Next Visit: 03/02/18  Last Fill: 11/01/17  Okay to refill Ambien?

## 2017-11-30 NOTE — Telephone Encounter (Signed)
Patient called requesting prescription refill of Ambien to be sent to [facility] on [address] in [place].  Patient states the pharmacist told her they need a new prescription from Dr. Estanislado Pandy.

## 2017-12-25 ENCOUNTER — Other Ambulatory Visit: Payer: Self-pay | Admitting: Physician Assistant

## 2017-12-27 NOTE — Telephone Encounter (Signed)
Last Visit: 08/30/17 Next Visit: 03/02/18  Okay to refill Ambien? 

## 2017-12-27 NOTE — Telephone Encounter (Signed)
Ok to refill 

## 2017-12-31 ENCOUNTER — Telehealth: Payer: Self-pay | Admitting: *Deleted

## 2017-12-31 NOTE — Telephone Encounter (Signed)
Received call from patient stating that the pharmacy has not received her Ambien prescription. Prescription was faxed earlier this week. Contacted pharmacy to verify and they state they had not received the fax. Gave a verbal authorization to refill Ambien.

## 2018-01-27 ENCOUNTER — Other Ambulatory Visit: Payer: Self-pay | Admitting: *Deleted

## 2018-01-27 MED ORDER — ZOLPIDEM TARTRATE 10 MG PO TABS: ORAL_TABLET | ORAL | 0 refills | Status: DC

## 2018-01-27 NOTE — Telephone Encounter (Signed)
Refill request received via fax  Last Visit: 08/30/17 Next Visit: 03/02/18  Okay to refill Ambien?

## 2018-02-17 NOTE — Progress Notes (Signed)
Office Visit Note  Patient: Carolyn Graham             Date of Birth: 1955-09-28           MRN: [id number]             PCP: Juluis Pitch, MD Referring: Juluis Pitch, MD Visit Date: 03/02/2018 Occupation: @GUAROCC @  Subjective:  Left medial epicondylitis   History of Present Illness: Carolyn Graham is a 62 y.o. female with history of fibromyalgia and osteoarthritis.  She presents today with left elbow pain and tenderness on the medial aspect of the left knee joint.  She states she has been more active recently while she was on vacation in [place].  She states she has been kayaking and cycling more recently.  She states she is planning to start exercising more on a regular basis.  She states if she does not take tramadol her pain is a 6/10 but after tramadol it is a 3/10.  She takes ambien at bedtime which helps her sleep at night.     Activities of Daily Living:  Patient reports morning stiffness for 24 hours.   Patient Reports nocturnal pain.  Difficulty dressing/grooming: Denies Difficulty climbing stairs: Reports Difficulty getting out of chair: Denies Difficulty using hands for taps, buttons, cutlery, and/or writing: Reports  Review of Systems  Constitutional: Positive for fatigue.  HENT: Positive for mouth dryness. Negative for mouth sores, trouble swallowing, trouble swallowing and nose dryness.   Eyes: Positive for dryness. Negative for pain, redness, itching and visual disturbance.  Respiratory: Negative for cough, hemoptysis, shortness of breath, wheezing and difficulty breathing.   Cardiovascular: Negative for chest pain, palpitations, hypertension and swelling in legs/feet.  Gastrointestinal: Negative for abdominal pain, blood in stool, constipation and diarrhea.  Endocrine: Negative for increased urination.  Genitourinary: Negative for painful urination, nocturia and pelvic pain.  Musculoskeletal: Positive for arthralgias, joint pain, joint swelling, myalgias,  morning stiffness, muscle tenderness and myalgias. Negative for muscle weakness.  Skin: Negative for color change, pallor, rash, hair loss, nodules/bumps, skin tightness, ulcers and sensitivity to sunlight.  Allergic/Immunologic: Negative for susceptible to infections.  Neurological: Negative for dizziness, fainting, numbness, headaches and memory loss.  Hematological: Negative for bruising/bleeding tendency and swollen glands.  Psychiatric/Behavioral: Negative for depressed mood, confusion and sleep disturbance. The patient is not nervous/anxious.     PMFS History:  Patient Active Problem List   Diagnosis Date Noted  . Primary osteoarthritis of both hands 08/30/2017  . Primary osteoarthritis of both knees 08/30/2017  . DDD (degenerative disc disease), cervical 08/30/2017  . DDD (degenerative disc disease), lumbar 08/30/2017  . History of insomnia 08/30/2017  . Fatty liver 08/30/2017  . History of TIA (transient ischemic attack) 08/30/2017  . S/p bilateral carpal tunnel release 08/30/2017  . History of gastroesophageal reflux (GERD) 08/30/2017  . History of vitamin D deficiency 08/30/2017  . Spondylolisthesis of lumbar region 01/12/2017  . Benign essential hypertension 05/06/2016  . Oral herpes 05/06/2016  . Carpal tunnel syndrome of left wrist 11/08/2015  . Hyperlipidemia, unspecified 05/10/2015  . Osteopenia 11/05/2014  . Closed fracture of fibula 01/09/2014  . Sleep apnea 01/06/2011  . Restless leg syndrome 01/06/2011  . Insomnia 01/06/2011  . Fibromyalgia 01/06/2011  . GERD (gastroesophageal reflux disease) 12/15/2007    Past Medical History:  Diagnosis Date  . Arthritis   . Broken ankle 11/2013   right ankle  . Cataract    Bil/ surgery on right eye  . Fatty liver   .  Fibromyalgia 2005  . GERD (gastroesophageal reflux disease)    uses prilosec  . Headache(784.0)    not as often as before  . Hypercholesteremia   . Hypertension   . Hyperthyroidism   . OSA  (obstructive sleep apnea)    CPAP  . Plantar fasciitis   . Post-operative nausea and vomiting   . Restless leg syndrome   . Schatzki's ring   . Spondylolisthesis of lumbar region   . Stress incontinence   . Stroke [facility]) 2015   TIA.  unable to determine if this is true.  . Thyroiditis   . Vertigo     Family History  Problem Relation Age of Onset  . Colon cancer Mother 58  . Lung cancer Father 7  . Colon cancer Maternal Grandfather   . Breast cancer Neg Hx    Past Surgical History:  Procedure Laterality Date  . CARPAL TUNNEL RELEASE Left 12/18/2015   Procedure: CARPAL TUNNEL RELEASE;  Surgeon: Leanor Kail, MD;  Location: [facility] ORS;  Service: Orthopedics;  Laterality: Left;  . CARPAL TUNNEL RELEASE Right 2018  . CATARACT EXTRACTION W/PHACO Right 11/13/2015   Procedure: CATARACT EXTRACTION PHACO AND INTRAOCULAR LENS PLACEMENT (IOC);  Surgeon: Estill Cotta, MD;  Location: [facility] ORS;  Service: Ophthalmology;  Laterality: Right;  [place] 01:08AP% 21.6CDE 26.99Fluid pack lot # [id number]  . CATARACT EXTRACTION W/PHACO Left 12/04/2015   Procedure: CATARACT EXTRACTION PHACO AND INTRAOCULAR LENS PLACEMENT (IOC);  Surgeon: Estill Cotta, MD;  Location: [facility] ORS;  Service: Ophthalmology;  Laterality: Left;  [place] 59.2 AP% 24.4 CDE 29.2 Fluid Pack lot # [id number]  . CERVICAL DISC SURGERY     cervical decompression c4-5 and c5-6  . COLONOSCOPY W/ BIOPSIES AND POLYPECTOMY    . CTR     right hand  . EYE SURGERY Bilateral    cataract   . LUMBAR SPINE SURGERY  2018  . SHOULDER SURGERY     right shoulder  . TRIGGER FINGER RELEASE     right finger  . WISDOM TOOTH EXTRACTION     Social History   Social History Narrative   Married to Luke, no natural children   Right handed   Bachelor's degree   2-3 cups daily          Objective: Vital Signs: BP 129/83 (BP Location: Left Arm, Patient Position: Sitting, Cuff Size: Normal)   Pulse 80   Resp 16   Ht 5\' 1"  (1.549 m)   Wt 214 lb  6.4 oz (97.3 kg)   BMI 40.51 kg/m    Physical Exam Vitals signs and nursing note reviewed.  Constitutional:      Appearance: She is well-developed.  HENT:     Head: Normocephalic and atraumatic.  Eyes:     Conjunctiva/sclera: Conjunctivae normal.  Neck:     Musculoskeletal: Normal range of motion.  Cardiovascular:     Rate and Rhythm: Normal rate and regular rhythm.     Heart sounds: Normal heart sounds.  Pulmonary:     Effort: Pulmonary effort is normal.     Breath sounds: Normal breath sounds.  Abdominal:     General: Bowel sounds are normal.     Palpations: Abdomen is soft.  Lymphadenopathy:     Cervical: No cervical adenopathy.  Skin:    General: Skin is warm and dry.     Capillary Refill: Capillary refill takes less than 2 seconds.  Neurological:     Mental Status: She is alert and oriented to person, place, and  time.  Psychiatric:        Behavior: Behavior normal.      Musculoskeletal Exam: C-spine, thoracic spine, and lumbar spine good ROM. No midline spinal tenderness. Shoulder joints, elbow joints, wrist joints, MCPs, PIPs, and DIPs good ROM with no synovitis. Tenderness of the left medial epicondyle. PIP and DIP synovial thickening consistent with osteoarthritis. Dupuytren's contracture bilateral 4th. Hip joints, knee joints, ankle joints, ankle joints, MTPs, PIPs, and DIPs good ROM with no synovitis. Tenderness on the medial aspect of the left knee.  No warmth or effusion of knee joints.  No tenderness or swelling of ankle joints.  Tenderness over trochanteric bursa bilaterally.    CDAI Exam: CDAI Score: Not documented Patient Global Assessment: Not documented; Provider Global Assessment: Not documented Swollen: Not documented; Tender: Not documented Joint Exam   Not documented   There is currently no information documented on the homunculus. Go to the Rheumatology activity and complete the homunculus joint exam.  Investigation: No additional  findings.  Imaging: No results found.  Recent Labs: Lab Results  Component Value Date   WBC 6.8 08/04/2017   HGB 12.5 08/04/2017   PLT 257 08/04/2017   NA 139 08/04/2017   K 4.2 08/04/2017   CL 104 08/04/2017   CO2 29 08/04/2017   GLUCOSE 111 (H) 08/04/2017   BUN 16 08/04/2017   CREATININE 0.83 08/04/2017   BILITOT 0.3 08/04/2017   ALKPHOS 70 01/07/2017   AST 58 (H) 08/04/2017   ALT 75 (H) 08/04/2017   PROT 7.1 08/04/2017   ALBUMIN 4.0 01/07/2017   CALCIUM 9.4 08/04/2017   GFRAA 89 08/04/2017    Speciality Comments: No specialty comments available.  Procedures:  No procedures performed Allergies: Codeine and Darvocet [propoxyphene n-acetaminophen]   Assessment / Plan:     Visit Diagnoses: Fibromyalgia: She has positive tender points on exam.  She has generalized muscle aches and muscle tenderness.  She presents today with left medial epicondylitis and tenderness on the medial aspect of the left knee joint.  She was given a handout of knee exercises and exercises for medial epicondylitis/ She continues to take cymbalta 30 mg po daily.  She takes tramadol 50 mg 1-2 tablets BID for pain relief.  She feels as though tramadol has been controlling her pain.  She continues to have chronic fatigue and insomnia, but she has been sleeping better at night with Ambien 10 mg by mouth at bedtime.  A refill of Lorrin Mais was sent to the pharmacy today.  She was encouraged to stay active and exercise on a regular basis. She was given a prescription for physical therapy for evaluation and treatment of bilateral knee OA and fibromyalgia to take with her to [facility]. She will follow up in 6 months.  Primary osteoarthritis of both hands: She has PIP and DIP synovial thickening consistent with osteoarthritis of both hands.  She has no synovitis on exam.  She has complete fist formation bilaterally.  Joint protection and muscle strengthening exercises were discussed. She has given a handout  of hand exercises that she can perform at home.   Primary osteoarthritis of both knees: No warmth or effusion.  She has good ROM with no discomfort. She is being [facility].  She can continue using voltaren gel topically PRN.   DDD (degenerative disc disease), cervical - S/P fusion: She has good ROM on exam with no discomfort.  She has no symptoms of radiculopathy at this time.   DDD (  degenerative disc disease), lumbar - L3-4, L4-5 fusion January 22, 2017 by Dr. Ellene Route.  Medial epicondylitis of elbow, left: She has tenderness on exam today.  She was encouraged to use voltaren gel topically and to perform stretching exercises.  A handout of exercises were provided to the patient.    Pain management - tramadol. UDS and narcotic agreement were updated today 03/02/2018.   History of insomnia: She takes Ambien 10 mg by mouth at bedtime for insomnia.  A refill was sent to the pharmacy today.   S/p bilateral carpal tunnel release: She has no symptoms of carpal tunnel at this time.   History of shoulder surgery - right: She has good ROM with no discomfort.    Medication monitoring encounter -Tramadol 50 mg 1-2 tablets BID PRN for pain relief.  UDS and narcotic agreement were updated today on 03/02/2018. Plan: Pain Mgmt, Profile 5 w/Conf, U, Pain Mgmt, Tramadol w/medMATCH, U   Other medical conditions are listed as follows:   Fatty liver - her LFTs continue to be elevated.  She states the statins contributed to her LFTs as well.  Tick bite, subsequent encounter - 08/30/2017 Lyme antibody negative.  RMSF antibodies not detected.  History of gastroesophageal reflux (GERD)  History of hypertension  History of TIA (transient ischemic attack) - questionable, 2015.  History of vitamin D deficiency  History of migraine  RLS (restless legs syndrome)  History of sleep apnea - CPAP   Orders: Orders Placed This Encounter  Procedures  . Pain Mgmt, Profile 5 w/Conf, U   . Pain Mgmt, Tramadol w/medMATCH, U   No orders of the defined types were placed in this encounter.   Face-to-face time spent with patient was 30 minutes. Greater than 50% of time was spent in counseling and coordination of care.  Follow-Up Instructions: Return in about 6 months (around 08/31/2018) for Fibromyalgia, Osteoarthritis.   Ofilia Neas, PA-C   I examined and evaluated the patient with Hazel Sams PA.  Patient continues to have some generalized pain and discomfort.  She also has osteoarthritis and disc disease which causes discomfort.  Pain is manageable with tramadol.  I did not notice any synovitis on examination.  Weight loss diet and exercise was discussed.  She is a still requiring Ambien for chronic insomnia.  The plan of care was discussed as noted above.  Bo Merino, MD  Note - This record has been created using Editor, commissioning.  Chart creation errors have been sought, but may not always  have been located. Such creation errors do not reflect on  the standard of medical care.

## 2018-02-28 ENCOUNTER — Other Ambulatory Visit: Payer: Self-pay | Admitting: Physician Assistant

## 2018-02-28 NOTE — Telephone Encounter (Signed)
Last Visit: 08/30/17 Next Visit: 03/02/18  Okay to refill Ambien?

## 2018-03-02 ENCOUNTER — Ambulatory Visit: Payer: No Typology Code available for payment source | Admitting: Rheumatology

## 2018-03-02 ENCOUNTER — Encounter: Payer: Self-pay | Admitting: Rheumatology

## 2018-03-02 VITALS — BP 129/83 | HR 80 | Resp 16 | Ht 61.0 in | Wt 214.4 lb

## 2018-03-02 DIAGNOSIS — Z9889 Other specified postprocedural states: Secondary | ICD-10-CM

## 2018-03-02 DIAGNOSIS — Z8673 Personal history of transient ischemic attack (TIA), and cerebral infarction without residual deficits: Secondary | ICD-10-CM

## 2018-03-02 DIAGNOSIS — M5030 Other cervical disc degeneration, unspecified cervical region: Secondary | ICD-10-CM | POA: Diagnosis not present

## 2018-03-02 DIAGNOSIS — M19041 Primary osteoarthritis, right hand: Secondary | ICD-10-CM | POA: Diagnosis not present

## 2018-03-02 DIAGNOSIS — M797 Fibromyalgia: Secondary | ICD-10-CM | POA: Diagnosis not present

## 2018-03-02 DIAGNOSIS — Z87898 Personal history of other specified conditions: Secondary | ICD-10-CM

## 2018-03-02 DIAGNOSIS — M170 Bilateral primary osteoarthritis of knee: Secondary | ICD-10-CM | POA: Diagnosis not present

## 2018-03-02 DIAGNOSIS — M5136 Other intervertebral disc degeneration, lumbar region: Secondary | ICD-10-CM

## 2018-03-02 DIAGNOSIS — Z8719 Personal history of other diseases of the digestive system: Secondary | ICD-10-CM

## 2018-03-02 DIAGNOSIS — Z8669 Personal history of other diseases of the nervous system and sense organs: Secondary | ICD-10-CM

## 2018-03-02 DIAGNOSIS — K760 Fatty (change of) liver, not elsewhere classified: Secondary | ICD-10-CM

## 2018-03-02 DIAGNOSIS — R52 Pain, unspecified: Secondary | ICD-10-CM

## 2018-03-02 DIAGNOSIS — M19042 Primary osteoarthritis, left hand: Secondary | ICD-10-CM

## 2018-03-02 DIAGNOSIS — Z8679 Personal history of other diseases of the circulatory system: Secondary | ICD-10-CM

## 2018-03-02 DIAGNOSIS — Z8639 Personal history of other endocrine, nutritional and metabolic disease: Secondary | ICD-10-CM

## 2018-03-02 DIAGNOSIS — G2581 Restless legs syndrome: Secondary | ICD-10-CM

## 2018-03-02 DIAGNOSIS — M7702 Medial epicondylitis, left elbow: Secondary | ICD-10-CM

## 2018-03-02 DIAGNOSIS — Z5181 Encounter for therapeutic drug level monitoring: Secondary | ICD-10-CM

## 2018-03-02 DIAGNOSIS — W57XXXD Bitten or stung by nonvenomous insect and other nonvenomous arthropods, subsequent encounter: Secondary | ICD-10-CM

## 2018-03-02 NOTE — Patient Instructions (Addendum)
Knee Exercises                        Ask your health care provider which exercises are safe for you. Do exercises exactly as told by your health care provider and adjust them as directed. It is normal to feel mild stretching, pulling, tightness, or discomfort as you do these exercises, but you should stop right away if you feel sudden pain or your pain gets worse.Do not begin these exercises until told by your health care provider.  STRETCHING AND RANGE OF MOTION EXERCISES  These exercises warm up your muscles and joints and improve the movement and flexibility of your knee. These exercises also help to relieve pain, numbness, and tingling.  Exercise A: Knee Extension, Prone  1. Lie on your abdomen on a bed.  2. Place your left / right knee just beyond the edge of the surface so your knee is not on the bed. You can put a towel under your left / right thigh just above your knee for comfort.  3. Relax your leg muscles and allow gravity to straighten your knee. You should feel a stretch behind your left / right knee.  4. Hold this position for __________ seconds.  5. Scoot up so your knee is supported between repetitions.  Repeat __________ times. Complete this stretch __________ times a day.  Exercise B: Knee Flexion, Active  1. Lie on your back with both knees straight. If this causes back discomfort, bend your left / right knee so your foot is flat on the floor.  2. Slowly slide your left / right heel back toward your buttocks until you feel a gentle stretch in the front of your knee or thigh.  3. Hold this position for __________ seconds.  4. Slowly slide your left / right heel back to the starting position.  Repeat __________ times. Complete this exercise __________ times a day.  Exercise C: Quadriceps, Prone  1. Lie on your abdomen on a firm surface, such as a bed or padded floor.  2. Bend your left / right knee and hold your ankle. If you cannot reach your ankle or pant leg, loop a belt around your foot and  grab the belt instead.  3. Gently pull your heel toward your buttocks. Your knee should not slide out to the side. You should feel a stretch in the front of your thigh and knee.  4. Hold this position for __________ seconds.  Repeat __________ times. Complete this stretch __________ times a day.  Exercise D: Hamstring, Supine  1. Lie on your back.  2. Loop a belt or towel over the ball of your left / right foot. The ball of your foot is on the walking surface, right under your toes.  3. Straighten your left / right knee and slowly pull on the belt to raise your leg until you feel a gentle stretch behind your knee.  ? Do not let your left / right knee bend while you do this.  ? Keep your other leg flat on the floor.  4. Hold this position for __________ seconds.  Repeat __________ times. Complete this stretch __________ times a day.  STRENGTHENING EXERCISES  These exercises build strength and endurance in your knee. Endurance is the ability to use your muscles for a long time, even after they get tired.  Exercise E: Quadriceps, Isometric  1. Lie on your back with your left / right leg extended and your   other knee bent. Put a rolled towel or small pillow under your knee if told by your health care provider.  2. Slowly tense the muscles in the front of your left / right thigh. You should see your kneecap slide up toward your hip or see increased dimpling just above the knee. This motion will push the back of the knee toward the floor.  3. For __________ seconds, keep the muscle as tight as you can without increasing your pain.  4. Relax the muscles slowly and completely.  Repeat __________ times. Complete this exercise __________ times a day.  Exercise F: Straight Leg Raises - Quadriceps  1. Lie on your back with your left / right leg extended and your other knee bent.  2. Tense the muscles in the front of your left / right thigh. You should see your kneecap slide up or see increased dimpling just above the knee. Your  thigh may even shake a bit.  3. Keep these muscles tight as you raise your leg 4-6 inches (10-15 cm) off the floor. Do not let your knee bend.  4. Hold this position for __________ seconds.  5. Keep these muscles tense as you lower your leg.  6. Relax your muscles slowly and completely after each repetition.  Repeat __________ times. Complete this exercise __________ times a day.  Exercise G: Hamstring, Isometric  1. Lie on your back on a firm surface.  2. Bend your left / right knee approximately __________ degrees.  3. Dig your left / right heel into the surface as if you are trying to pull it toward your buttocks. Tighten the muscles in the back of your thighs to dig as hard as you can without increasing any pain.  4. Hold this position for __________ seconds.  5. Release the tension gradually and allow your muscles to relax completely for __________ seconds after each repetition.  Repeat __________ times. Complete this exercise __________ times a day.  Exercise H: Hamstring Curls  If told by your health care provider, do this exercise while wearing ankle weights. Begin with __________ weights. Then increase the weight by 1 lb (0.5 kg) increments. Do not wear ankle weights that are more than __________.  1. Lie on your abdomen with your legs straight.  2. Bend your left / right knee as far as you can without feeling pain. Keep your hips flat against the floor.  3. Hold this position for __________ seconds.  4. Slowly lower your leg to the starting position.  Repeat __________ times. Complete this exercise __________ times a day.  Exercise I: Squats (Quadriceps)  1. Stand in front of a table, with your feet and knees pointing straight ahead. You may rest your hands on the table for balance but not for support.  2. Slowly bend your knees and lower your hips like you are going to sit in a chair.  ? Keep your weight over your heels, not over your toes.  ? Keep your lower legs upright so they are parallel with the  table legs.  ? Do not let your hips go lower than your knees.  ? Do not bend lower than told by your health care provider.  ? If your knee pain increases, do not bend as low.  3. Hold the squat position for __________ seconds.  4. Slowly push with your legs to return to standing. Do not use your hands to pull yourself to standing.  Repeat __________ times. Complete this exercise __________ times a   day. Exercise K: Straight Leg Raises - Hip Abductors 1. Lie on your side with your left / right leg in the top position. Lie so your head, shoulder, knee, and hip line up. You may bend your bottom knee to help you keep your balance. 2. Roll your hips slightly forward so your hips are stacked directly over each other and your left / right knee is facing forward. 3. Leading with your heel, lift your top leg 4-6 inches (10-15 cm). You should feel the muscles in your outer hip lifting. ? Do not let your foot drift forward. ? Do not let your knee roll toward the ceiling. 4. Hold this position for __________ seconds. 5. Slowly return your leg to the starting position. 6. Let your muscles relax completely after each repetition. Repeat __________ times. Complete this exercise __________ times a day. Exercise L: Straight Leg Raises - Hip Extensors 1. Lie on your abdomen on a firm surface. You can put a pillow under your hips if that is more comfortable. 2. Tense the muscles in your buttocks and lift your left / right leg about 4-6 inches (10-15 cm). Keep your  knee straight as you lift your leg. 3. Hold this position for __________ seconds. 4. Slowly lower your leg to the starting position. 5. Let your leg relax completely after each repetition. Repeat __________ times. Complete this exercise __________ times a day. This information is not intended to replace advice given to you by your health care provider. Make sure you discuss any questions you have with your health care provider. Document Released: 12/24/2004 Document Revised: 11/04/2015 Document Reviewed: 12/16/2014 Elsevier Interactive Patient Education  2019 [facility] Ask your health care provider which exercises are safe for you. Do exercises exactly as told by your health care provider and adjust them as directed. It is normal to feel mild stretching, pulling, tightness, or discomfort as you do these exercises, but you should stop right away if you feel sudden pain or your pain gets worse. Do not begin these exercises until told by your health care provider. Stretching and range of motion exercises These exercises warm up your muscles and joints and improve the movement and flexibility of your elbow. Exercise A: Wrist flexors  1. Straighten your left / right elbow in front of you with your palm up. If told by your health care provider, do this stretch with your elbow bent rather than straight. 2. With your other hand, gently pull your left / right hand and fingers toward you until you feel a gentle stretch on the top of your forearm. 3. Hold this position for __________ seconds. Repeat __________ times. Complete this exercise __________ times a day. Strengthening exercises These exercises build strength and endurance in your elbow. Endurance is the ability to use your muscles for a long time, even after several repetitions. Exercise B: Wrist flexion  1. Sit with your left / right forearm palm-up and supported on a table or other surface. 2. Let your left / right  wrist extend over the edge of the surface. 3. Hold a __________ weight or a piece of rubber exercise band or tubing. Slowly curl your hand up toward your forearm. Try to only move your hand and keep the rest of your arm still. 4. Hold this position for __________ seconds. 5. Slowly return to the starting position. Repeat __________ times. Complete this exercise __________ times a day. Exercise C: Eccentric wrist flexion 1. Sit with your left / right  forearm palm-up and supported on a table or other surface. 2. Let your left / right wrist extend over the edge of the surface. 3. Hold a __________ weight or a piece of rubber exercise band or tubing. 4. Use your other hand to move your left / right hand up toward your forearm. 5. Slowly return to the starting position using only your left / right hand. Repeat __________ times. Complete this exercise __________ times a day. Exercise D: Hand turns, pronation i 1. Sit with your left / right forearm supported on a table or other surface. 2. Move your wrist so your pinkie travels toward your forearm and your thumb moves away from your forearm. 3. Hold this position for __________ seconds. 4. Slowly return to the starting position. Exercise E: Hand turns, pronation ii  1. Sit with your left / right forearm supported on a table or other surface. 2. Hold a hammer in your left / right hand. ? The exercise will be easier if you hold on near the head of the hammer. ? If you hold on toward the end of the handle, the exercise will be harder. 3. Rest your left / right hand over the edge of the table with your palm facing up. 4. Without moving your elbow, slowly turn your palm and your hand down toward the table. 5. Hold this position for __________ seconds. 6. Slowly return to the starting position. Repeat __________ times. Complete this exercise __________ times a day. Exercise F: Shoulder blade squeeze 1. Sit in a stable chair with good posture. Do not  let your back touch the back of the chair. 2. Your arms should be at your sides with your elbows bent. You can rest your forearms on a pillow. 3. Squeeze your shoulder blades together. Keep your shoulders level. Do not lift your shoulders up toward your ears. 4. Hold this position for __________ seconds. 5. Slowly release. Repeat __________ times. Complete this exercise __________ times a day. This information is not intended to replace advice given to you by your health care provider. Make sure you discuss any questions you have with your health care provider. Document Released: 02/09/2005 Document Revised: 10/17/2015 Document Reviewed: 10/22/2014 Elsevier Interactive Patient Education  2019 [employer]  Hand Exercises Hand exercises can be helpful to almost anyone. These exercises can strengthen the hands, improve flexibility and movement, and increase blood flow to the hands. These results can make work and daily tasks easier. Hand exercises can be especially helpful for people who have joint pain from arthritis or have nerve damage from overuse (carpal tunnel syndrome). These exercises can also help people who have injured a hand. Most of these hand exercises are fairly gentle stretching routines. You can do them often throughout the day. Still, it is a good idea to ask your health care provider which exercises would be best for you. Warming your hands before exercise may help to reduce stiffness. You can do this with gentle massage or by placing your hands in warm water for 15 minutes. Also, make sure you pay attention to your level of hand pain as you begin an exercise routine. Exercises Knuckle bend Repeat this exercise 5-10 times with each hand. 1. Stand or sit with your arm, hand, and all five fingers pointed straight up. Make sure your wrist is straight. 2. Gently and slowly bend your fingers down and inward until the tips of your fingers are touching the tops of your palm. 3. Hold  this position for  a few seconds. 4. Extend your fingers out to their original position, all pointing straight up again. Finger fan Repeat this exercise 5-10 times with each hand. 1. Hold your arm and hand out in front of you. Keep your wrist straight. 2. Squeeze your hand into a fist. 3. Hold this position for a few seconds. 4. Edison Simon out, or spread apart, your hand and fingers as much as possible, stretching every joint fully. Tabletop Repeat this exercise 5-10 times with each hand. 1. Stand or sit with your arm, hand, and all five fingers pointed straight up. Make sure your wrist is straight. 2. Gently and slowly bend your fingers at the knuckles where they meet the hand until your hand is making an upside-down L shape. Your fingers should form a tabletop. 3. Hold this position for a few seconds. 4. Extend your fingers out to their original position, all pointing straight up again. Making Os Repeat this exercise 5-10 times with each hand. 1. Stand or sit with your arm, hand, and all five fingers pointed straight up. Make sure your wrist is straight. 2. Make an O shape by touching your pointer finger to your thumb. Hold for a few seconds. Then open your hand wide. 3. Repeat this motion with each finger on your hand. Table spread Repeat this exercise 5-10 times with each hand. 1. Place your hand on a table with your palm facing down. Make sure your wrist is straight. 2. Spread your fingers out as much as possible. Hold this position for a few seconds. 3. Slide your fingers back together again. Hold for a few seconds. Ball grip Repeat this exercise 10-15 times with each hand. 1. Hold a tennis ball or another soft ball in your hand. 2. While slowly increasing pressure, squeeze the ball as hard as possible. 3. Squeeze as hard as you can for 3-5 seconds. 4. Relax and repeat.  Wrist curls Repeat this exercise 10-15 times with each hand. 1. Sit in a chair that has armrests. 2. Hold a light  weight in your hand, such as a dumbbell that weighs 1-3 pounds (0.5-1.4 kg). Ask your health care provider what weight would be best for you. 3. Rest your hand just over the end of the chair arm with your palm facing up. 4. Gently pivot your wrist up and down while holding the weight. Do not twist your wrist from side to side. Contact a health care provider if:  Your hand pain or discomfort gets much worse when you do an exercise.  Your hand pain or discomfort does not improve within 2 hours after you exercise. If you have any of these problems, stop doing these exercises right away. Do not do them again unless your health care provider says that you can. Get help right away if:  You develop sudden, severe hand pain. If this happens, stop doing these exercises right away. Do not do them again unless your health care provider says that you can. This information is not intended to replace advice given to you by your health care provider. Make sure you discuss any questions you have with your health care provider. Document Released: 01/21/2015 Document Revised: 06/15/2017 Document Reviewed: 08/20/2014 Elsevier Interactive Patient Education  2019 [employer].

## 2018-03-04 LAB — PAIN MGMT, PROFILE 5 W/CONF, U
AMPHETAMINES: NEGATIVE ng/mL (ref <500)
Barbiturates: NEGATIVE ng/mL (ref <300)
Benzodiazepines: NEGATIVE ng/mL (ref <100)
COCAINE METABOLITE: NEGATIVE ng/mL (ref <150)
CREATININE: 127.3 mg/dL
Marijuana Metabolite: NEGATIVE ng/mL (ref <20)
Methadone Metabolite: NEGATIVE ng/mL (ref <100)
OXIDANT: NEGATIVE ug/mL (ref <200)
Opiates: NEGATIVE ng/mL (ref <100)
Oxycodone: NEGATIVE ng/mL (ref <100)
pH: 7.04 (ref 4.5–9.0)

## 2018-03-04 LAB — PAIN MGMT, TRAMADOL W/MEDMATCH, U
Desmethyltramadol: NEGATIVE ng/mL (ref <100)
Tramadol: NEGATIVE ng/mL (ref <100)

## 2018-03-04 NOTE — Progress Notes (Signed)
UDS is negative for tramadol.  Please check how often this patient is getting tramadol refilled.

## 2018-03-27 ENCOUNTER — Other Ambulatory Visit: Payer: Self-pay | Admitting: Physician Assistant

## 2018-03-27 NOTE — Telephone Encounter (Signed)
Ok to refill 

## 2018-03-28 NOTE — Telephone Encounter (Signed)
Last Visit: 03/02/18 Next Visit: 08/31/18

## 2018-04-24 ENCOUNTER — Other Ambulatory Visit: Payer: Self-pay | Admitting: Rheumatology

## 2018-04-25 NOTE — Telephone Encounter (Signed)
ok 

## 2018-04-25 NOTE — Telephone Encounter (Signed)
Last Visit: 03/02/18 Next Visit: 08/31/18  Okay to refill Ambien and Tizanidine?

## 2018-05-19 ENCOUNTER — Other Ambulatory Visit: Payer: Self-pay | Admitting: Rheumatology

## 2018-05-20 NOTE — Telephone Encounter (Signed)
Last Visit: 03/02/18 Next Visit: 08/31/18  Okay to refill Ambien? 

## 2018-06-13 DIAGNOSIS — E119 Type 2 diabetes mellitus without complications: Secondary | ICD-10-CM | POA: Insufficient documentation

## 2018-06-20 ENCOUNTER — Other Ambulatory Visit: Payer: Self-pay | Admitting: Physician Assistant

## 2018-06-21 NOTE — Telephone Encounter (Signed)
Last Visit: 03/02/2018 Next Visit: 08/31/2018  Last fill: 05/20/2018  Okay to refill Lorrin Mais?

## 2018-07-27 ENCOUNTER — Other Ambulatory Visit: Payer: Self-pay | Admitting: Physician Assistant

## 2018-07-27 NOTE — Telephone Encounter (Signed)
Last Visit: 03/02/18 Next Visit: 08/31/18  Okay to refill Ambien?

## 2018-08-17 NOTE — Progress Notes (Signed)
Office Visit Note  Patient: Carolyn Graham             Date of Birth: 1955/12/14           MRN: [id number]             PCP: Juluis Pitch, MD Referring: Juluis Pitch, MD Visit Date: 08/31/2018 Occupation: @GUAROCC @  Subjective:  Pain in both knees.    History of Present Illness: Carolyn Graham is a 63 y.o. female history of fibromyalgia and osteoarthritis.  She is taking Cymbalta 30 mg daily, tramadol 50 mg 1 to 2 tablets twice daily as needed, and tizanidine 4 mg at bedtime as needed. She needs a refill of tramadol. She finds them to be moderately effective. She has been having pain in bilateral knees which is keeping her up at night. She also recently pulled her hamstring which is causing discomfort.  She has discomfort in her lower back, shoulders, and buttocks.  She is taking Ambien 10 mg nightly for insomnia.  She has not been sleeping well and only gets 6 hours of sleep a night.  Activities of Daily Living:  Patient reports morning stiffness for several hours.   Patient Reports nocturnal pain.  Difficulty dressing/grooming: Reports Difficulty climbing stairs: Reports Difficulty getting out of chair: Denies Difficulty using hands for taps, buttons, cutlery, and/or writing: Denies  Review of Systems  Constitutional: Positive for fatigue.  HENT: Positive for mouth dryness and nose dryness. Negative for mouth sores.   Eyes: Negative for itching and dryness.  Respiratory: Negative for shortness of breath and difficulty breathing.   Cardiovascular: Negative for chest pain, palpitations and swelling in legs/feet.  Gastrointestinal: Negative for abdominal pain, blood in stool, constipation and diarrhea.  Endocrine: Negative for increased urination.  Genitourinary: Negative for painful urination and pelvic pain.  Musculoskeletal: Positive for arthralgias, joint pain, joint swelling and morning stiffness.  Skin: Negative for rash, hair loss and redness.  Allergic/Immunologic:  Negative for susceptible to infections.  Neurological: Positive for dizziness, headaches and weakness. Negative for memory loss.  Hematological: Negative for swollen glands.  Psychiatric/Behavioral: Positive for sleep disturbance. Negative for confusion. The patient is not nervous/anxious.     PMFS History:  Patient Active Problem List   Diagnosis Date Noted  . Primary osteoarthritis of both hands 08/30/2017  . Primary osteoarthritis of both knees 08/30/2017  . DDD (degenerative disc disease), cervical 08/30/2017  . DDD (degenerative disc disease), lumbar 08/30/2017  . History of insomnia 08/30/2017  . Fatty liver 08/30/2017  . History of TIA (transient ischemic attack) 08/30/2017  . S/p bilateral carpal tunnel release 08/30/2017  . History of gastroesophageal reflux (GERD) 08/30/2017  . History of vitamin D deficiency 08/30/2017  . Spondylolisthesis of lumbar region 01/12/2017  . Benign essential hypertension 05/06/2016  . Oral herpes 05/06/2016  . Carpal tunnel syndrome of left wrist 11/08/2015  . Hyperlipidemia, unspecified 05/10/2015  . Osteopenia 11/05/2014  . Closed fracture of fibula 01/09/2014  . Sleep apnea 01/06/2011  . Restless leg syndrome 01/06/2011  . Insomnia 01/06/2011  . Fibromyalgia 01/06/2011  . GERD (gastroesophageal reflux disease) 12/15/2007    Past Medical History:  Diagnosis Date  . Arthritis   . Broken ankle 11/2013   right ankle  . Cataract    Bil/ surgery on right eye  . Fatty liver   . Fibromyalgia 2005  . GERD (gastroesophageal reflux disease)    uses prilosec  . Headache(784.0)    not as often as before  .  Hypercholesteremia   . Hypertension   . Hyperthyroidism   . OSA (obstructive sleep apnea)    CPAP  . Plantar fasciitis   . Post-operative nausea and vomiting   . Restless leg syndrome   . Schatzki's ring   . Spondylolisthesis of lumbar region   . Stress incontinence   . Stroke [facility]) 2015   TIA.  unable to determine if this is  true.  . Thyroiditis   . Vertigo     Family History  Problem Relation Age of Onset  . Colon cancer Mother 57  . Lung cancer Father 51  . Colon cancer Maternal Grandfather   . Breast cancer Neg Hx    Past Surgical History:  Procedure Laterality Date  . CARPAL TUNNEL RELEASE Left 12/18/2015   Procedure: CARPAL TUNNEL RELEASE;  Surgeon: Leanor Kail, MD;  Location: [facility] ORS;  Service: Orthopedics;  Laterality: Left;  . CARPAL TUNNEL RELEASE Right 2018  . CATARACT EXTRACTION W/PHACO Right 11/13/2015   Procedure: CATARACT EXTRACTION PHACO AND INTRAOCULAR LENS PLACEMENT (IOC);  Surgeon: Estill Cotta, MD;  Location: [facility] ORS;  Service: Ophthalmology;  Laterality: Right;  [place] 01:08AP% 21.6CDE 26.99Fluid pack lot # [id number]  . CATARACT EXTRACTION W/PHACO Left 12/04/2015   Procedure: CATARACT EXTRACTION PHACO AND INTRAOCULAR LENS PLACEMENT (IOC);  Surgeon: Estill Cotta, MD;  Location: [facility] ORS;  Service: Ophthalmology;  Laterality: Left;  [place] 59.2 AP% 24.4 CDE 29.2 Fluid Pack lot # [id number]  . CERVICAL DISC SURGERY     cervical decompression c4-5 and c5-6  . COLONOSCOPY W/ BIOPSIES AND POLYPECTOMY    . CTR     right hand  . EYE SURGERY Bilateral    cataract   . LUMBAR SPINE SURGERY  2018  . SHOULDER SURGERY     right shoulder  . TRIGGER FINGER RELEASE     right finger  . WISDOM TOOTH EXTRACTION     Social History   Social History Narrative   Married to [place], no natural children   Right handed   Bachelor's degree   2-3 cups daily         Immunization History  Administered Date(s) Administered  . Influenza-Unspecified 02/15/2015, 01/01/2016, 12/02/2016  . Tdap 11/14/2007     Objective: Vital Signs: BP 126/77 (BP Location: Left Arm, Patient Position: Sitting, Cuff Size: Normal)   Pulse 82   Resp 14   Ht 5\' 1"  (1.549 m)   Wt 220 lb (99.8 kg)   BMI 41.57 kg/m    Physical Exam Vitals signs and nursing note reviewed.  Constitutional:      Appearance: She  is well-developed.  HENT:     Head: Normocephalic and atraumatic.  Eyes:     Conjunctiva/sclera: Conjunctivae normal.  Neck:     Musculoskeletal: Normal range of motion.  Cardiovascular:     Rate and Rhythm: Normal rate and regular rhythm.     Heart sounds: Normal heart sounds.  Pulmonary:     Effort: Pulmonary effort is normal.     Breath sounds: Normal breath sounds.  Abdominal:     General: Bowel sounds are normal.     Palpations: Abdomen is soft.  Lymphadenopathy:     Cervical: No cervical adenopathy.  Skin:    General: Skin is warm and dry.     Capillary Refill: Capillary refill takes less than 2 seconds.  Neurological:     Mental Status: She is alert and oriented to person, place, and time.  Psychiatric:  Behavior: Behavior normal.      Musculoskeletal Exam: C-spine thoracic and lumbar spine with limited range of motion.  Shoulder joints elbow joints with good range of motion.  She has DIP and PIP thickening in her hands consistent with osteoarthritis.  She has discomfort with range of motion of bilateral knee joints without any warmth swelling or effusion.  She has some osteoarthritic changes in her MTPs and PIPs of her feet.  She has generalized hyperalgesia and positive tender points.  CDAI Exam: CDAI Score: - Patient Global: -; Provider Global: - Swollen: -; Tender: - Joint Exam   No joint exam has been documented for this visit   There is currently no information documented on the homunculus. Go to the Rheumatology activity and complete the homunculus joint exam.  Investigation: No additional findings.  Imaging: Xr Knee 3 View Left  Result Date: 08/31/2018 Severe patellofemoral narrowing was noted.  Severe medial compartment narrowing was noted.  Medial and lateral osteophytes were noted.  No chondrocalcinosis was noted. Impression: These findings are consistent with severe osteoarthritis and severe chondromalacia patella.  Xr Knee 3 View Right   Result Date: 08/31/2018 Moderate to severe medial compartment narrowing was noted.  Severe patellofemoral narrowing was noted.  No chondrocalcinosis was noted. Impression: These findings are consistent with severe osteoarthritis and severe chondromalacia patella.   Recent Labs: Lab Results  Component Value Date   WBC 6.8 08/04/2017   HGB 12.5 08/04/2017   PLT 257 08/04/2017   NA 139 08/04/2017   K 4.2 08/04/2017   CL 104 08/04/2017   CO2 29 08/04/2017   GLUCOSE 111 (H) 08/04/2017   BUN 16 08/04/2017   CREATININE 0.83 08/04/2017   BILITOT 0.3 08/04/2017   ALKPHOS 70 01/07/2017   AST 58 (H) 08/04/2017   ALT 75 (H) 08/04/2017   PROT 7.1 08/04/2017   ALBUMIN 4.0 01/07/2017   CALCIUM 9.4 08/04/2017   GFRAA 89 08/04/2017    Speciality Comments: No specialty comments available.  Procedures:  Large Joint Inj: bilateral knee on 08/31/2018 10:55 AM Indications: pain Details: 27 G 1.5 in needle, medial approach  Arthrogram: No  Medications (Right): 1.5 mL lidocaine 1 %; 40 mg triamcinolone acetonide 40 MG/ML Aspirate (Right): 0 mL Medications (Left): 1.5 mL lidocaine 1 %; 40 mg triamcinolone acetonide 40 MG/ML Aspirate (Left): 0 mL Outcome: tolerated well, no immediate complications Procedure, treatment alternatives, risks and benefits explained, specific risks discussed. Consent was given by the patient. Immediately prior to procedure a time out was called to verify the correct patient, procedure, equipment, support staff and site/side marked as required. Patient was prepped and draped in the usual sterile fashion.     Allergies: Codeine and Darvocet [propoxyphene n-acetaminophen]   Assessment / Plan:     Visit Diagnoses: Fibromyalgia -her pain is manageable on the current regimen which includes Ambien, tizanidine, cymbalta, tramadol. -She takes tramadol only on PRN basis.  Per her request prescription refill for tramadol will be given today.  Medication monitoring encounter -  tramadol. UDS: 1/8/2020Narc agreement: 03/02/2018 - Plan: Pain Mgmt, Profile 5 w/Conf, U, Pain Mgmt, Tramadol w/medMATCH, U,  Primary osteoarthritis of both hands -joint protection was discussed.  Pain in both knees-she has been having increased pain and discomfort in the bilateral knee joints.  Will obtain x-ray of her knee joints today.  No warmth swelling or effusion was noted.  Primary osteoarthritis of both knees - Plan: XR KNEE 3 VIEW RIGHT, XR KNEE 3 VIEW LEFT, x-ray showed severe  osteoarthritis of bilateral knee joints worse in the left knee and bilateral severe chondromalacia patella.  Per patient's request bilateral knee joints were injected with cortisone as described above.  She tolerated the procedure well.  Weight loss diet and exercise was discussed.  We will also apply for Visco supplement injections per patient's request.  I will refer her to physical therapy.  A prescription was given.  I have given her information on [facility].  DDD (degenerative disc disease), cervical - S/P fusion -pain is manageable.  DDD (degenerative disc disease), lumbar - L3-4, L4-5 fusion January 22, 2017 by Dr. Ellene Route.   Pain management -  History of gastroesophageal reflux (GERD)   History of migraine    RLS (restless legs syndrome)   History of TIA (transient ischemic attack) - questionable, 2015.   History of hypertension - Plan: Her blood pressure is controlled.  History of sleep apnea - CPAP -   History of vitamin D deficiency -  Fatty liver    Orders: Orders Placed This Encounter  Procedures  . XR KNEE 3 VIEW RIGHT  . XR KNEE 3 VIEW LEFT  . Pain Mgmt, Profile 5 w/Conf, U  . Pain Mgmt, Tramadol w/medMATCH, U   Meds ordered this encounter  Medications  . traMADol (ULTRAM) 50 MG tablet    Sig: Take 1-2 tablets (50-100 mg total) by mouth 2 (two) times daily as needed.    Dispense:  120 tablet    Refill:  0    Follow-Up Instructions: Return in about  6 months (around 03/03/2019) for OA,FMS.   Bo Merino, MD  Note - This record has been created using Editor, commissioning.  Chart creation errors have been sought, but may not always  have been located. Such creation errors do not reflect on  the standard of medical care.

## 2018-08-23 ENCOUNTER — Other Ambulatory Visit: Payer: Self-pay | Admitting: Physician Assistant

## 2018-08-23 NOTE — Telephone Encounter (Signed)
Last Visit: 03/02/2018 Next Visit: 08/31/2018  Last fill: 07/27/18  Okay to refill Lorrin Mais?

## 2018-08-24 ENCOUNTER — Telehealth: Payer: Self-pay | Admitting: Rheumatology

## 2018-08-24 NOTE — Telephone Encounter (Signed)
Spoke with pharmacist and provided pharmacist with a verbal to refill Ambien prescription.

## 2018-08-24 NOTE — Telephone Encounter (Signed)
Colletta Maryland, pharmacist at [facility] called stating they had a computer upgrade and patient's prescription of Zolpidem was accidentally deleted.  Colletta Maryland is requesting that you resend the prescription. Colletta Maryland states if you have any questions, she can be reached at [phone]

## 2018-08-31 ENCOUNTER — Ambulatory Visit (INDEPENDENT_AMBULATORY_CARE_PROVIDER_SITE_OTHER): Payer: PRIVATE HEALTH INSURANCE

## 2018-08-31 ENCOUNTER — Ambulatory Visit: Payer: PRIVATE HEALTH INSURANCE | Admitting: Rheumatology

## 2018-08-31 ENCOUNTER — Other Ambulatory Visit: Payer: Self-pay

## 2018-08-31 ENCOUNTER — Ambulatory Visit: Payer: Self-pay

## 2018-08-31 ENCOUNTER — Encounter: Payer: Self-pay | Admitting: Rheumatology

## 2018-08-31 ENCOUNTER — Telehealth: Payer: Self-pay

## 2018-08-31 VITALS — BP 126/77 | HR 82 | Resp 14 | Ht 61.0 in | Wt 220.0 lb

## 2018-08-31 DIAGNOSIS — M5030 Other cervical disc degeneration, unspecified cervical region: Secondary | ICD-10-CM

## 2018-08-31 DIAGNOSIS — M797 Fibromyalgia: Secondary | ICD-10-CM | POA: Diagnosis not present

## 2018-08-31 DIAGNOSIS — G8929 Other chronic pain: Secondary | ICD-10-CM

## 2018-08-31 DIAGNOSIS — Z8639 Personal history of other endocrine, nutritional and metabolic disease: Secondary | ICD-10-CM

## 2018-08-31 DIAGNOSIS — M25562 Pain in left knee: Secondary | ICD-10-CM

## 2018-08-31 DIAGNOSIS — Z5181 Encounter for therapeutic drug level monitoring: Secondary | ICD-10-CM | POA: Diagnosis not present

## 2018-08-31 DIAGNOSIS — M170 Bilateral primary osteoarthritis of knee: Secondary | ICD-10-CM | POA: Diagnosis not present

## 2018-08-31 DIAGNOSIS — M19041 Primary osteoarthritis, right hand: Secondary | ICD-10-CM | POA: Diagnosis not present

## 2018-08-31 DIAGNOSIS — G2581 Restless legs syndrome: Secondary | ICD-10-CM

## 2018-08-31 DIAGNOSIS — Z8669 Personal history of other diseases of the nervous system and sense organs: Secondary | ICD-10-CM

## 2018-08-31 DIAGNOSIS — Z8673 Personal history of transient ischemic attack (TIA), and cerebral infarction without residual deficits: Secondary | ICD-10-CM

## 2018-08-31 DIAGNOSIS — Z8679 Personal history of other diseases of the circulatory system: Secondary | ICD-10-CM

## 2018-08-31 DIAGNOSIS — M19042 Primary osteoarthritis, left hand: Secondary | ICD-10-CM

## 2018-08-31 DIAGNOSIS — Z8719 Personal history of other diseases of the digestive system: Secondary | ICD-10-CM

## 2018-08-31 DIAGNOSIS — R52 Pain, unspecified: Secondary | ICD-10-CM

## 2018-08-31 DIAGNOSIS — M25561 Pain in right knee: Secondary | ICD-10-CM

## 2018-08-31 DIAGNOSIS — K760 Fatty (change of) liver, not elsewhere classified: Secondary | ICD-10-CM

## 2018-08-31 DIAGNOSIS — M5136 Other intervertebral disc degeneration, lumbar region: Secondary | ICD-10-CM

## 2018-08-31 MED ORDER — TRIAMCINOLONE ACETONIDE 40 MG/ML IJ SUSP
40.0000 mg | INTRAMUSCULAR | Status: AC | PRN
  Administered 2018-08-31: 40 mg via INTRA_ARTICULAR

## 2018-08-31 MED ORDER — TRAMADOL HCL 50 MG PO TABS: 50.0000 mg | ORAL_TABLET | Freq: Two times a day (BID) | ORAL | 0 refills | Status: DC | PRN

## 2018-08-31 MED ORDER — LIDOCAINE HCL 1 % IJ SOLN
1.5000 mL | INTRAMUSCULAR | Status: AC | PRN
  Administered 2018-08-31: 1.5 mL

## 2018-08-31 NOTE — Telephone Encounter (Signed)
Please apply for bilateral knee visco, per Dr. Deveshwar. Thanks!  

## 2018-09-01 LAB — PAIN MGMT, PROFILE 5 W/CONF, U
Amphetamines: NEGATIVE ng/mL
Barbiturates: NEGATIVE ng/mL
Benzodiazepines: NEGATIVE ng/mL
Cocaine Metabolite: NEGATIVE ng/mL
Creatinine: 63.4 mg/dL
Marijuana Metabolite: NEGATIVE ng/mL
Methadone Metabolite: NEGATIVE ng/mL
Opiates: NEGATIVE ng/mL
Oxidant: NEGATIVE ug/mL
Oxycodone: NEGATIVE ng/mL
pH: 5.5 (ref 4.5–9.0)

## 2018-09-01 LAB — PAIN MGMT, TRAMADOL W/MEDMATCH, U
Desmethyltramadol: 148 ng/mL
Tramadol: 378 ng/mL

## 2018-09-02 ENCOUNTER — Telehealth: Payer: Self-pay

## 2018-09-02 NOTE — Telephone Encounter (Signed)
Noted  

## 2018-09-02 NOTE — Telephone Encounter (Signed)
Submitted VOB for Orthovisc, bilateral knee.

## 2018-09-22 ENCOUNTER — Other Ambulatory Visit: Payer: Self-pay | Admitting: Physician Assistant

## 2018-09-22 NOTE — Telephone Encounter (Signed)
Last Visit: 08/31/18 Next Visit: 03/02/19  Last Fill 08/23/18  Okay to refill Ambien?

## 2018-10-03 ENCOUNTER — Telehealth: Payer: Self-pay

## 2018-10-03 NOTE — Telephone Encounter (Signed)
Please schedule patient appointment with Dr. Estanislado Pandy or Lovena Le for gel injection.  Thank you.  Approved for Orthovisc series, bilateral knee. Buy & Bill Covered through insurance at 100%. May have a $50.00 Co-pay No PA required

## 2018-10-13 ENCOUNTER — Ambulatory Visit (INDEPENDENT_AMBULATORY_CARE_PROVIDER_SITE_OTHER): Payer: PRIVATE HEALTH INSURANCE | Admitting: Physician Assistant

## 2018-10-13 ENCOUNTER — Other Ambulatory Visit: Payer: Self-pay

## 2018-10-13 DIAGNOSIS — M170 Bilateral primary osteoarthritis of knee: Secondary | ICD-10-CM

## 2018-10-13 MED ORDER — HYALURONAN 30 MG/2ML IX SOSY
30.0000 mg | PREFILLED_SYRINGE | INTRA_ARTICULAR | Status: AC | PRN
  Administered 2018-10-13: 30 mg via INTRA_ARTICULAR

## 2018-10-13 MED ORDER — LIDOCAINE HCL 1 % IJ SOLN
1.5000 mL | INTRAMUSCULAR | Status: AC | PRN
  Administered 2018-10-13: 1.5 mL

## 2018-10-13 NOTE — Progress Notes (Signed)
   Procedure Note  Patient: Carolyn Graham             Date of Birth: 1956/02/03           MRN: [id number]             Visit Date: 10/13/2018  Procedures: Visit Diagnoses:  1. Primary osteoarthritis of both knees   Orthovisc #1 Bilateral knee joint injections B/B  Large Joint Inj: bilateral knee on 10/13/2018 12:23 PM Indications: pain Details: 25 G 1.5 in needle, medial approach  Arthrogram: No  Medications (Right): 30 mg Hyaluronan 30 MG/2ML; 1.5 mL lidocaine 1 % Aspirate (Right): 0 mL Medications (Left): 30 mg Hyaluronan 30 MG/2ML; 1.5 mL lidocaine 1 % Aspirate (Left): 0 mL Outcome: tolerated well, no immediate complications Procedure, treatment alternatives, risks and benefits explained, specific risks discussed. Consent was given by the patient. Immediately prior to procedure a time out was called to verify the correct patient, procedure, equipment, support staff and site/side marked as required. Patient was prepped and draped in the usual sterile fashion.      Patient tolerated the procedure well.  Hazel Sams, PA-C

## 2018-10-20 ENCOUNTER — Ambulatory Visit (INDEPENDENT_AMBULATORY_CARE_PROVIDER_SITE_OTHER): Payer: PRIVATE HEALTH INSURANCE | Admitting: Physician Assistant

## 2018-10-20 ENCOUNTER — Other Ambulatory Visit: Payer: Self-pay | Admitting: Physician Assistant

## 2018-10-20 ENCOUNTER — Other Ambulatory Visit: Payer: Self-pay

## 2018-10-20 DIAGNOSIS — M170 Bilateral primary osteoarthritis of knee: Secondary | ICD-10-CM | POA: Diagnosis not present

## 2018-10-20 MED ORDER — HYALURONAN 30 MG/2ML IX SOSY
30.0000 mg | PREFILLED_SYRINGE | INTRA_ARTICULAR | Status: AC | PRN
  Administered 2018-10-20: 30 mg via INTRA_ARTICULAR

## 2018-10-20 MED ORDER — LIDOCAINE HCL 1 % IJ SOLN
1.5000 mL | INTRAMUSCULAR | Status: AC | PRN
  Administered 2018-10-20: 1.5 mL

## 2018-10-20 NOTE — Progress Notes (Signed)
   Procedure Note  Patient: Carolyn Graham             Date of Birth: [birth year]           MRN: [id number]             Visit Date: 10/20/2018  Procedures: Visit Diagnoses:  1. Primary osteoarthritis of both knees    Orthovisc #2 bilateral knee joint injections B/B  Large Joint Inj: bilateral knee on 10/20/2018 8:04 AM Indications: pain Details: 25 G 1.5 in needle, medial approach  Arthrogram: No  Medications (Right): 30 mg Hyaluronan 30 MG/2ML; 1.5 mL lidocaine 1 % Aspirate (Right): 0 mL Medications (Left): 30 mg Hyaluronan 30 MG/2ML; 1.5 mL lidocaine 1 % Aspirate (Left): 0 mL Outcome: tolerated well, no immediate complications Procedure, treatment alternatives, risks and benefits explained, specific risks discussed. Consent was given by the patient. Immediately prior to procedure a time out was called to verify the correct patient, procedure, equipment, support staff and site/side marked as required. Patient was prepped and draped in the usual sterile fashion.    Patient tolerated the procedure well.  Hazel Sams, PA-C

## 2018-10-20 NOTE — Telephone Encounter (Signed)
Last Visit: 08/31/18 Next Visit: 03/02/19  Okay to refill Ambien?

## 2018-10-27 ENCOUNTER — Ambulatory Visit (INDEPENDENT_AMBULATORY_CARE_PROVIDER_SITE_OTHER): Payer: PRIVATE HEALTH INSURANCE | Admitting: Physician Assistant

## 2018-10-27 ENCOUNTER — Other Ambulatory Visit: Payer: Self-pay

## 2018-10-27 DIAGNOSIS — M170 Bilateral primary osteoarthritis of knee: Secondary | ICD-10-CM | POA: Diagnosis not present

## 2018-10-27 MED ORDER — LIDOCAINE HCL 1 % IJ SOLN
1.5000 mL | INTRAMUSCULAR | Status: AC | PRN
  Administered 2018-10-27: 1.5 mL

## 2018-10-27 MED ORDER — HYALURONAN 30 MG/2ML IX SOSY
30.0000 mg | PREFILLED_SYRINGE | INTRA_ARTICULAR | Status: AC | PRN
  Administered 2018-10-27: 30 mg via INTRA_ARTICULAR

## 2018-10-27 NOTE — Progress Notes (Signed)
   Procedure Note  Patient: Carolyn Graham             Date of Birth: 1955-11-30           MRN: [id number]             Visit Date: 10/27/2018  Procedures: Visit Diagnoses:  1. Primary osteoarthritis of both knees   Orthovisc #3 Bilateral knee joint injections B/B  Large Joint Inj: bilateral knee on 10/27/2018 10:04 AM Indications: pain Details: 25 G 1.5 in needle, medial approach  Arthrogram: No  Medications (Right): 30 mg Hyaluronan 30 MG/2ML; 1.5 mL lidocaine 1 % Aspirate (Right): 0 mL Medications (Left): 30 mg Hyaluronan 30 MG/2ML; 1.5 mL lidocaine 1 % Aspirate (Left): 0 mL Outcome: tolerated well, no immediate complications Procedure, treatment alternatives, risks and benefits explained, specific risks discussed. Consent was given by the patient. Immediately prior to procedure a time out was called to verify the correct patient, procedure, equipment, support staff and site/side marked as required. Patient was prepped and draped in the usual sterile fashion.      Patient tolerated the procedure well.  Hazel Sams, PA-C

## 2018-11-21 ENCOUNTER — Other Ambulatory Visit: Payer: Self-pay | Admitting: Physician Assistant

## 2018-11-21 NOTE — Telephone Encounter (Signed)
Last Visit: 08/31/18 Next Visit: 03/02/19  Okay to refill Ambien?

## 2018-12-26 ENCOUNTER — Other Ambulatory Visit: Payer: Self-pay | Admitting: Physician Assistant

## 2018-12-27 NOTE — Telephone Encounter (Signed)
Last Visit: 08/31/18 Next Visit: 03/02/19  Okay to refill Ambien?

## 2019-01-23 ENCOUNTER — Other Ambulatory Visit: Payer: Self-pay | Admitting: Rheumatology

## 2019-01-23 ENCOUNTER — Other Ambulatory Visit: Payer: Self-pay

## 2019-01-23 DIAGNOSIS — M797 Fibromyalgia: Secondary | ICD-10-CM

## 2019-01-23 MED ORDER — TRAMADOL HCL 50 MG PO TABS: 50.0000 mg | ORAL_TABLET | Freq: Two times a day (BID) | ORAL | 0 refills | Status: DC | PRN

## 2019-01-23 NOTE — Telephone Encounter (Signed)
Notes recorded by Ofilia Neas, PA-C on 09/02/2018 at 12:16 PM EDT  UDS is consistent with treatment.  Last RF 12/27/2018 Last appt 08/31/2018 Next appt 03/01/2018

## 2019-01-23 NOTE — Telephone Encounter (Signed)
Refill of tramadol was printed so it will need to be faxed to the pharmacy.   Ok to refill [facility].

## 2019-01-23 NOTE — Telephone Encounter (Signed)
Notes recorded by Ofilia Neas, PA-C on 09/02/2018 at 12:16 PM EDT  UDS is consistent with treatment.  Last RF 08/31/2018 Last appt 08/31/2018 Next appt 03/01/2018

## 2019-01-24 ENCOUNTER — Other Ambulatory Visit: Payer: Self-pay

## 2019-01-24 MED ORDER — ZOLPIDEM TARTRATE 10 MG PO TABS: 10.0000 mg | ORAL_TABLET | Freq: Every day | ORAL | 0 refills | Status: DC

## 2019-01-24 NOTE — Telephone Encounter (Signed)
Refill request received via fax from [facility] for Ambien.   Last Visit: 08/31/2018 Next Visit: 03/02/2019  Last fill: 12/27/2018  Okay to refill Lorrin Mais?

## 2019-01-24 NOTE — Telephone Encounter (Signed)
Faxed

## 2019-02-20 ENCOUNTER — Other Ambulatory Visit: Payer: Self-pay

## 2019-02-20 NOTE — Telephone Encounter (Signed)
Refill request received via fax from [facility] on [address] in [place] for [place].   Last Visit: 08/31/2018 Next Visit: 03/02/2019  Last fill: 01/24/2019  Okay to refill Lorrin Mais?

## 2019-02-21 ENCOUNTER — Telehealth: Payer: Self-pay | Admitting: Rheumatology

## 2019-02-21 NOTE — Telephone Encounter (Signed)
Patient left a voicemail stating she was returning your call.   

## 2019-02-21 NOTE — Telephone Encounter (Signed)
Attempted to contact patient and left message on machine to advise patient to call the office.  

## 2019-02-21 NOTE — Telephone Encounter (Signed)
She should restart taking Ambien into half.  She should take only half a tablet unless 1 tablet is really needed.  For this month we will refill 30 tablets of Ambien but from the next month we will give her a reduced dose which could be 20 tablets depending on her need.  It is for her safety as Ambien causes increased risk of falling due to dizziness and drowsiness.

## 2019-02-21 NOTE — Telephone Encounter (Signed)
Returned patient's call, see previous telephone encounter.

## 2019-02-21 NOTE — Telephone Encounter (Signed)
Spoke with patient and patient is hesitant to decrease dose of ambien. Patient states the 10mg  of ambien nightly, doesn't help some nights.

## 2019-02-21 NOTE — Telephone Encounter (Signed)
Based on her age the recommendations are to reduce Ambien to 5 mg p.o. nightly.  I will be okay to refill Ambien 5 mg p.o. nightly if she is in agreement.

## 2019-02-22 MED ORDER — ZOLPIDEM TARTRATE 10 MG PO TABS: ORAL_TABLET | ORAL | 0 refills | Status: DC

## 2019-02-22 NOTE — Telephone Encounter (Signed)
Spoke with patient and she is in agreement, please send prescription electronically. Thanks!

## 2019-02-27 NOTE — Progress Notes (Signed)
Virtual Visit via Video Note  I connected with Carolyn Graham on 02/27/19 at  9:30 AM EST by a video enabled telemedicine application and verified that I am speaking with the correct person using two identifiers.  Location: Patient: Home Provider: Clinic This service was conducted via virtual visit.  Both audio and visual tools were used.  The patient was located at home. I was located in my office.  Consent was obtained prior to the virtual visit and is aware of possible charges through their insurance for this visit.  The patient is an established patient.  Dr. Estanislado Pandy, MD conducted the virtual visit and Hazel Sams, PA-C acted as scribe during the service.  Office staff helped with scheduling follow up visits after the service was conducted.   I discussed the limitations of evaluation and management by telemedicine and the availability of in person appointments. The patient expressed understanding and agreed to proceed.  CC: Pain in both knee joints  History of Present Illness: Carolyn Graham is a 64 y.o. female history of fibromyalgia and osteoarthritis.  She is taking Cymbalta 30 mg daily, tramadol 50 mg 1 to 2 tablets twice daily as needed, and tizanidine 4 mg at bedtime as needed.  She is currently having pain in both knee joint and both elbow joints.  She had visco gel injections in both knee joints in August/September 2020 in both knee joints but did not notice much improvement.  She has ongoing pain in both hands due to osteoarthritis. She has intermittent muscle aches and muscle spasms related to fibromyalgia.  She takes zanaflex 4 mg po at bedtime as needed for muscle spasm and Ambien 10 mg 1/2 tablet at bedtime.  She takes tramadol as needed for pain relief and uses voltaren gel topically as needed.  She continues to take cymbalta 30 mg 1 capsule po daily.   Review of Systems  Constitutional: Positive for malaise/fatigue. Negative for fever.  Eyes: Negative for photophobia, pain,  discharge and redness.  Respiratory: Negative for cough, shortness of breath and wheezing.   Cardiovascular: Negative for chest pain and palpitations.  Gastrointestinal: Negative for blood in stool, constipation and diarrhea.  Genitourinary: Negative for dysuria.  Musculoskeletal: Positive for joint pain and myalgias. Negative for back pain and neck pain.  Skin: Negative for rash.  Neurological: Negative for dizziness and headaches.  Psychiatric/Behavioral: Negative for depression. The patient has insomnia (Ambien 5 mg po at bedtime ). The patient is not nervous/anxious.      Observations/Objective: Physical Exam  Constitutional: She is oriented to person, place, and time and well-developed, well-nourished, and in no distress.  HENT:  Head: Normocephalic and atraumatic.  Eyes: Conjunctivae are normal.  Pulmonary/Chest: Effort normal.  Neurological: She is alert and oriented to person, place, and time.  Psychiatric: Mood, memory, affect and judgment normal.   Patient reports morning stiffness for 30   minutes.   Patient reports nocturnal pain.  Difficulty dressing/grooming: Denies Difficulty climbing stairs: Reports Difficulty getting out of chair: Reports Difficulty using hands for taps, buttons, cutlery, and/or writing: Reports   Assessment and Plan: Visit Diagnoses: Fibromyalgia -She has generalized muscle aches and muscle tenderness due to fibromyalgia. She experiences muscle spasms intermittently.  She takes cymbalta 30 mg 1 capsule by mouth daily, tramadol as needed for pain relief, and zanaflex 4 mg po at bedtime for muscle spasms.  She has chronic fatigue related to insomnia. She takes ambien 10 mg 1/2 tablet by mouth at bedtime for insomnia. We  discussed the importance of regular exercise and good sleep hygiene.  She will follow up in 6 months.   Medication monitoring encounter - Tramadol. UDS: 7/8/2020Narc agreement: 08/31/2018.  She is due to update UDS and narcotic  agreement.  Future orders for UDS will be placed today.   Primary osteoarthritis of both hands: She has PIP and DIP synovial thickening consistent with osteoarthritis of both hands.  She has intermittent inflammation in DIP joints.  Joint protection and muscle strengthening were discussed.  She can continue using voltaren gel topically as needed for pain relief.   Primary osteoarthritis of both knees -  X-ray showed severe osteoarthritis of bilateral knee joints worse in the left knee and bilateral severe chondromalacia patella.  She had visco gel injections in August/September 2020, but she only noticed minimal improvement after these injections.  She would like a referral to an orthopedic surgeon at [facility] to discuss scheduling a knee replacement in the future.   DDD (degenerative disc disease), cervical - S/P fusion: She has chronic neck pain and stiffness.   DDD (degenerative disc disease), lumbar - L3-4, L4-5 fusion January 22, 2017 by Dr. Ellene Route.   Other medical conditions are listed as follows:   Pain management   History of gastroesophageal reflux (GERD)   History of migraine    RLS (restless legs syndrome)   History of TIA (transient ischemic attack) - questionable, 2015.   History of hypertension   History of sleep apnea - CPAP   History of vitamin D deficiency   Fatty liver   Follow Up Instructions: She will follow up in 6 months    I discussed the assessment and treatment plan with the patient. The patient was provided an opportunity to ask questions and all were answered. The patient agreed with the plan and demonstrated an understanding of the instructions.   The patient was advised to call back or seek an in-person evaluation if the symptoms worsen or if the condition fails to improve as anticipated.  I provided 25 minutes of non-face-to-face time during this encounter.  Bo Merino, MD   Scribed by-  Hazel Sams, PA-C

## 2019-03-02 ENCOUNTER — Other Ambulatory Visit: Payer: Self-pay

## 2019-03-02 ENCOUNTER — Encounter: Payer: Self-pay | Admitting: Rheumatology

## 2019-03-02 ENCOUNTER — Telehealth (INDEPENDENT_AMBULATORY_CARE_PROVIDER_SITE_OTHER): Payer: PRIVATE HEALTH INSURANCE | Admitting: Rheumatology

## 2019-03-02 ENCOUNTER — Other Ambulatory Visit: Payer: Self-pay | Admitting: *Deleted

## 2019-03-02 DIAGNOSIS — M19042 Primary osteoarthritis, left hand: Secondary | ICD-10-CM

## 2019-03-02 DIAGNOSIS — G2581 Restless legs syndrome: Secondary | ICD-10-CM

## 2019-03-02 DIAGNOSIS — Z5181 Encounter for therapeutic drug level monitoring: Secondary | ICD-10-CM | POA: Diagnosis not present

## 2019-03-02 DIAGNOSIS — Z8679 Personal history of other diseases of the circulatory system: Secondary | ICD-10-CM

## 2019-03-02 DIAGNOSIS — M5030 Other cervical disc degeneration, unspecified cervical region: Secondary | ICD-10-CM

## 2019-03-02 DIAGNOSIS — Z8669 Personal history of other diseases of the nervous system and sense organs: Secondary | ICD-10-CM

## 2019-03-02 DIAGNOSIS — M5136 Other intervertebral disc degeneration, lumbar region: Secondary | ICD-10-CM

## 2019-03-02 DIAGNOSIS — M797 Fibromyalgia: Secondary | ICD-10-CM

## 2019-03-02 DIAGNOSIS — M19041 Primary osteoarthritis, right hand: Secondary | ICD-10-CM

## 2019-03-02 DIAGNOSIS — Z8719 Personal history of other diseases of the digestive system: Secondary | ICD-10-CM

## 2019-03-02 DIAGNOSIS — M170 Bilateral primary osteoarthritis of knee: Secondary | ICD-10-CM

## 2019-03-02 DIAGNOSIS — K760 Fatty (change of) liver, not elsewhere classified: Secondary | ICD-10-CM

## 2019-03-02 DIAGNOSIS — Z8673 Personal history of transient ischemic attack (TIA), and cerebral infarction without residual deficits: Secondary | ICD-10-CM

## 2019-03-02 DIAGNOSIS — R52 Pain, unspecified: Secondary | ICD-10-CM

## 2019-03-02 DIAGNOSIS — Z8639 Personal history of other endocrine, nutritional and metabolic disease: Secondary | ICD-10-CM

## 2019-03-02 MED ORDER — TIZANIDINE HCL 4 MG PO CAPS: ORAL_CAPSULE | ORAL | 1 refills | Status: DC

## 2019-03-16 ENCOUNTER — Telehealth: Payer: Self-pay | Admitting: Rheumatology

## 2019-03-16 NOTE — Telephone Encounter (Signed)
-----   Message from Shona Needles, RT sent at 03/02/2019  1:32 PM EST ----- Regarding: 6 MONTH F/U

## 2019-03-16 NOTE — Telephone Encounter (Signed)
I LMOM for patient to call, and schedule a follow up appointment for 6 months (around 08/30/19).

## 2019-03-21 ENCOUNTER — Ambulatory Visit: Payer: PRIVATE HEALTH INSURANCE | Admitting: Orthopaedic Surgery

## 2019-03-27 IMAGING — MG MM DIGITAL SCREENING BILAT W/ TOMO W/ CAD
8 of 14 series · 8 of 30 positions shown · non-contrast
Comparison: Previous exam(s).

CLINICAL DATA: Screening.

EXAM:
2D DIGITAL SCREENING BILATERAL MAMMOGRAM WITH CAD AND ADJUNCT TOMO

[R XCCL]
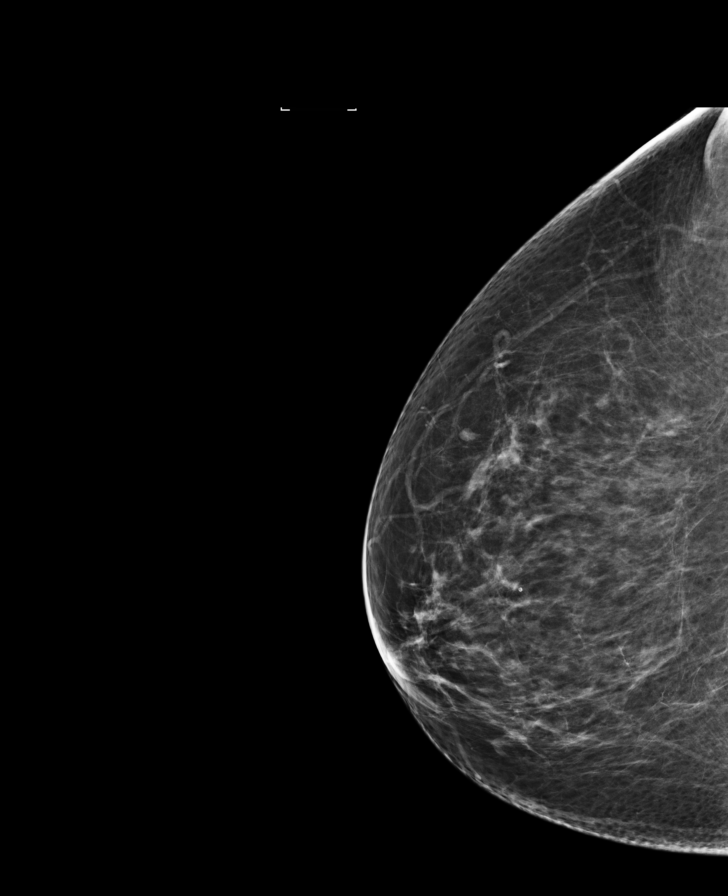

[R MLO (1 of 2)]
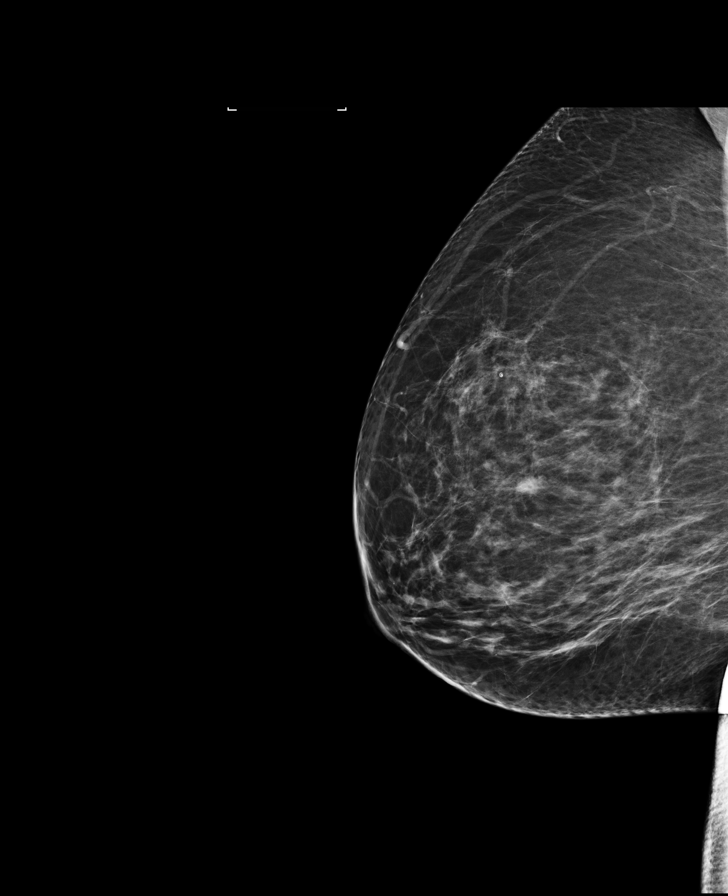

[L CC synth-2D]
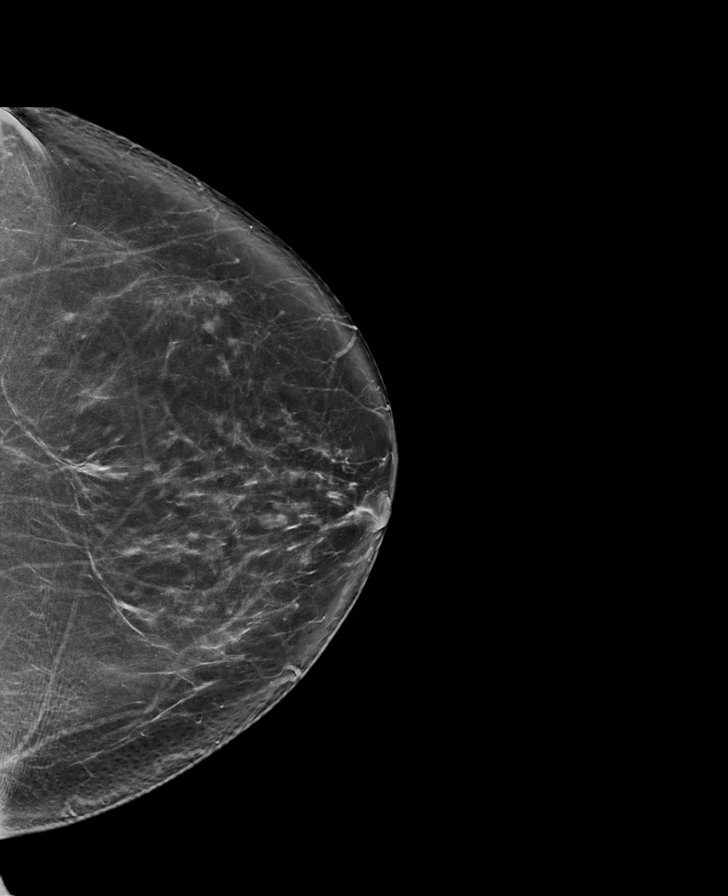

[L CC]
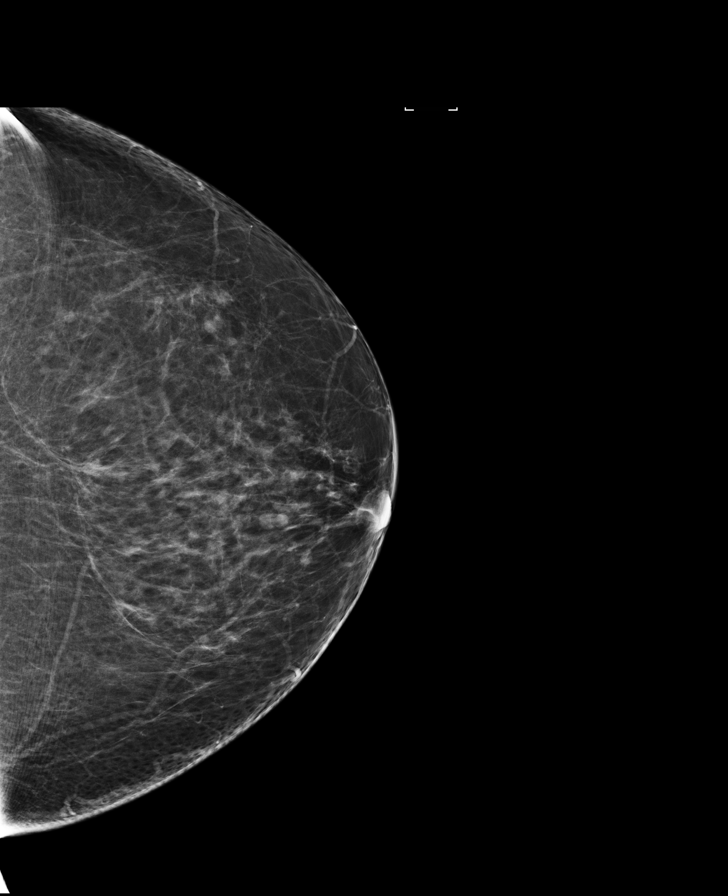

[L MLO synth-2D]
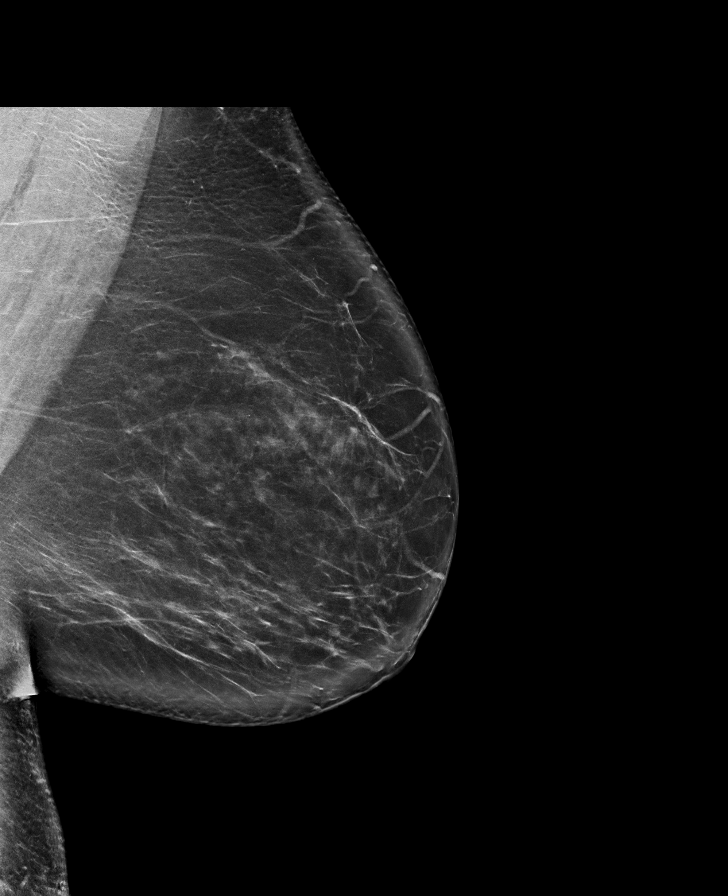

[R MLO (2 of 2)]
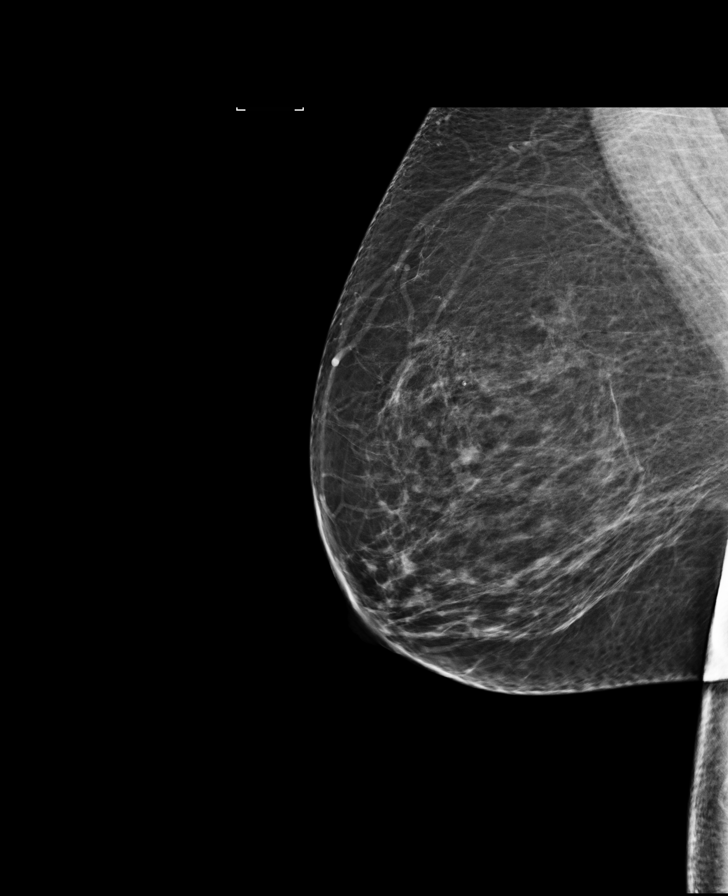

[R MLO synth-2D]
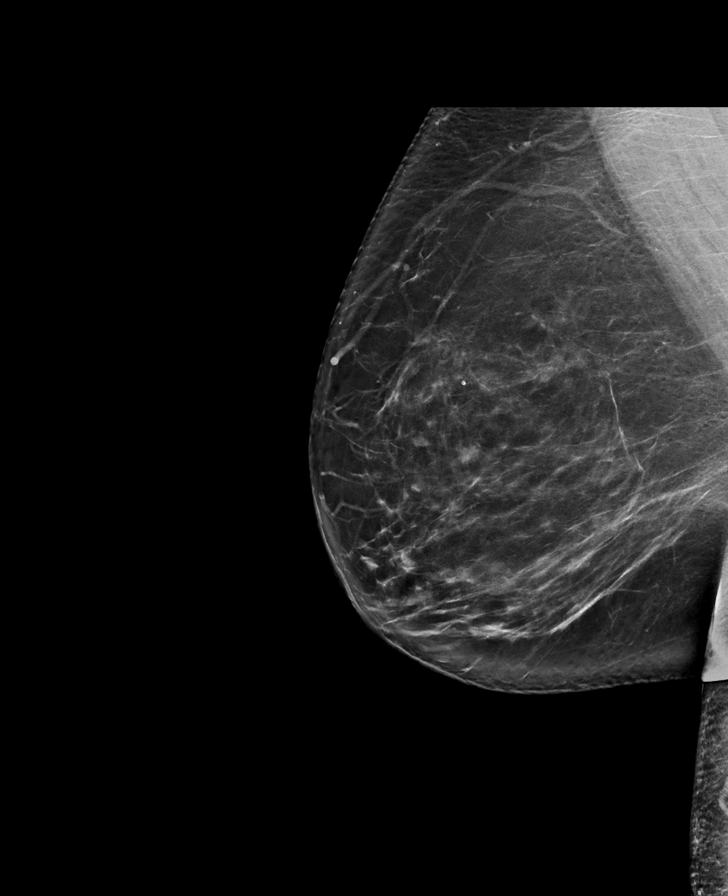

[R CC]
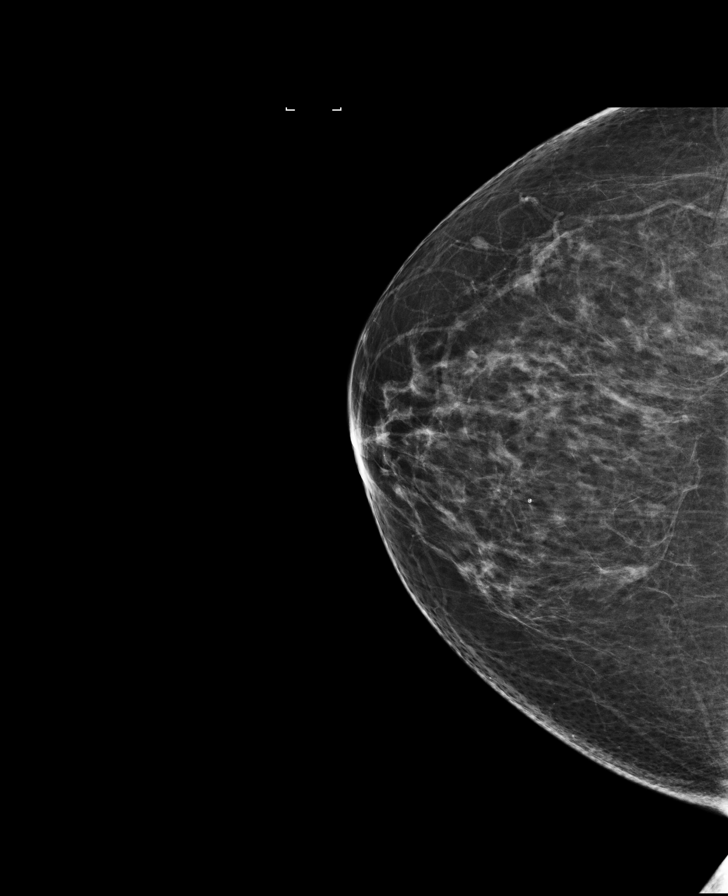

[8 of 30 positions shown; findings below may reference images not displayed]

ACR Breast Density Category b: There are scattered areas of
fibroglandular density.
FINDINGS: There are no findings suspicious for malignancy. Images were
processed with CAD.
IMPRESSION: No mammographic evidence of malignancy. A result letter of this
screening mammogram will be mailed directly to the patient.

RECOMMENDATION:
Screening mammogram in one year. (Code:97-6-RS4)

BI-RADS CATEGORY  1: Negative.

## 2019-04-06 ENCOUNTER — Other Ambulatory Visit: Payer: Self-pay | Admitting: *Deleted

## 2019-04-06 MED ORDER — ZOLPIDEM TARTRATE 10 MG PO TABS: ORAL_TABLET | ORAL | 0 refills | Status: DC

## 2019-04-06 NOTE — Telephone Encounter (Signed)
Last Visit: 03/02/19 Next Visit: due July 2021  Last Fill 02/22/19  Okay to refill Ambien?

## 2019-04-18 ENCOUNTER — Other Ambulatory Visit: Payer: Self-pay | Admitting: Family Medicine

## 2019-04-18 DIAGNOSIS — Z1231 Encounter for screening mammogram for malignant neoplasm of breast: Secondary | ICD-10-CM

## 2019-05-11 ENCOUNTER — Ambulatory Visit: Payer: PRIVATE HEALTH INSURANCE | Attending: Internal Medicine

## 2019-05-11 DIAGNOSIS — Z23 Encounter for immunization: Secondary | ICD-10-CM

## 2019-05-11 NOTE — Progress Notes (Signed)
   Covid-19 Vaccination Clinic  Name:  Carolyn Graham    MRN: [id number] DOB: Jul 04, 1955  05/11/2019  Ms. Aragon was observed post Covid-19 immunization for 15 minutes without incident. She was provided with Vaccine Information Sheet and instruction to access the V-Safe system.   Ms. Bennis was instructed to call 911 with any severe reactions post vaccine: Mar[place] Kitchen Difficulty breathing  . Swelling of face and throat  . A fast heartbeat  . A bad rash all over body  . Dizziness and weakness   Immunizations Administered    Name Date Dose VIS Date Route   Pfizer COVID-19 Vaccine 05/11/2019 10:41 AM 0.3 mL 02/03/2019 Intramuscular   Manufacturer: [place]   Lot: [id number]   [place]: [id number]

## 2019-05-24 ENCOUNTER — Ambulatory Visit
Admission: RE | Admit: 2019-05-24 | Discharge: 2019-05-24 | Disposition: A | Discharge status: Home / Self Care | Payer: PRIVATE HEALTH INSURANCE | Source: Ambulatory Visit | Attending: Family Medicine | Admitting: Family Medicine

## 2019-05-24 DIAGNOSIS — Z1231 Encounter for screening mammogram for malignant neoplasm of breast: Secondary | ICD-10-CM | POA: Diagnosis not present

## 2019-06-06 ENCOUNTER — Ambulatory Visit: Payer: PRIVATE HEALTH INSURANCE | Attending: Internal Medicine

## 2019-06-06 DIAGNOSIS — Z23 Encounter for immunization: Secondary | ICD-10-CM

## 2019-06-06 NOTE — Progress Notes (Signed)
   Covid-19 Vaccination Clinic  Name:  Carolyn Graham    MRN: [id number] DOB: [birth year]  06/06/2019  Carolyn Graham was observed post Covid-19 immunization for 15 minutes without incident. She was provided with Vaccine Information Sheet and instruction to access the V-Safe system.   Carolyn Graham was instructed to call 911 with any severe reactions post vaccine: Marland Kitchen Difficulty breathing  . Swelling of face and throat  . A fast heartbeat  . A bad rash all over body  . Dizziness and weakness   Immunizations Administered    Name Date Dose VIS Date Route   Pfizer COVID-19 Vaccine 06/06/2019 11:08 AM 0.3 mL 02/03/2019 Intramuscular   Manufacturer: [place]   Lot: [id number]   [place]: [id number]

## 2019-06-07 ENCOUNTER — Other Ambulatory Visit: Payer: Self-pay

## 2019-06-07 MED ORDER — ZOLPIDEM TARTRATE 10 MG PO TABS: ORAL_TABLET | ORAL | 0 refills | Status: DC

## 2019-06-07 NOTE — Telephone Encounter (Signed)
Please call to notify her that the new prescription will be written to dispense 20 tablets only.  She can continue taking Ambien 10 mg 1/2 tablet to 1 tablet at bedtime as needed.  She should continue to try to reduce the dose of Ambien.

## 2019-06-07 NOTE — Telephone Encounter (Signed)
Advised patient that the new prescription will be written to dispense 20 tablets only.  She can continue taking Ambien 10 mg 1/2 tablet to 1 tablet at bedtime as needed.  She should continue to try to reduce the dose of Ambien. Patient verbalized understanding.

## 2019-06-07 NOTE — Telephone Encounter (Signed)
Refill request received via fax from [facility] on [address] in [place] for [place].   Last Visit: 03/02/2019 telemedicine  Next Visit: message sent to the front desk to schedule, due 08/2019.  Last fill: 04/06/2019 (30 tablets)   Per refill note on 02/20/2019, She should restart taking Ambien into half.  She should take only half a tablet unless 1 tablet is really needed.  For this month we will refill 30 tablets of Ambien but from the next month we will give her a reduced dose which could be 20 tablets depending on her need.  It is for her safety as Ambien causes increased risk of falling due to dizziness and drowsiness.  Okay to refill ambien?

## 2019-07-26 ENCOUNTER — Other Ambulatory Visit: Payer: Self-pay | Admitting: *Deleted

## 2019-07-26 MED ORDER — ZOLPIDEM TARTRATE 10 MG PO TABS: ORAL_TABLET | ORAL | 0 refills | Status: DC

## 2019-07-26 NOTE — Telephone Encounter (Signed)
Refill request received via fax  Last Visit: 03/02/2019 Next Visit: 08/30/2019  Okay to refill Ambien?

## 2019-08-21 NOTE — Progress Notes (Signed)
Office Visit Note  Patient: Carolyn Graham             Date of Birth: 19-Sep-1955           MRN: [id number]             PCP: Juluis Pitch, MD Referring: Juluis Pitch, MD Visit Date: 08/30/2019 Occupation: @GUAROCC @  Subjective:  Pain in both knees  History of Present Illness: NATALIE MCEUEN is a 64 y.o. female with history of fibromyalgia, DDD, and osteoarthritis.  She continues to experience intermittent myalgias and muscle tenderness due to fibromyalgia.  She has chronic fatigue secondary to insomnia.  She has been experiencing nocturnal pain as well as difficulty falling asleep at night.  She takes tramadol 50 mg 1-2 tablets by mouth as needed for pain relief, which has been effective at managing her discomfort.  She continues to take cymbalta 30 mg 1 capsule by mouth daily, which helps with her headaches and depression.  She takes ambien 10 mg 1/2 tab-1 tablet by mouth at bedtime for insomnia.  She does not need any refills of these medications at this time. She has been experiencing significant discomfort in both knee joints.  She denies any joint swelling.  She denies any recent injuries or mechanical symptoms.  She had Orthovisc gel injections in August 2020 but did not notice much improvement.  She would like to reapply for another brand of gel injections for both knee joints.  She would also like cortisone injections in both knees today. She recently discontinued fosamax.  She continues to take calcium and vitamin D.  She denies any recent falls or fractures.     Activities of Daily Living:  Patient reports joint stiffness all day  Patient Reports nocturnal pain.  Difficulty dressing/grooming: Denies Difficulty climbing stairs: Reports Difficulty getting out of chair: Reports Difficulty using hands for taps, buttons, cutlery, and/or writing: Denies  Review of Systems  Constitutional: Positive for fatigue.  HENT: Positive for mouth sores and mouth dryness. Negative for  nose dryness.   Eyes: Positive for redness. Negative for pain, visual disturbance and dryness.  Respiratory: Negative for cough, hemoptysis, shortness of breath and difficulty breathing.   Cardiovascular: Negative for chest pain, palpitations, hypertension and swelling in legs/feet.  Gastrointestinal: Positive for diarrhea. Negative for blood in stool and constipation.  Endocrine: Negative for increased urination.  Genitourinary: Negative for painful urination.  Musculoskeletal: Positive for arthralgias, joint pain, myalgias, morning stiffness, muscle tenderness and myalgias. Negative for joint swelling and muscle weakness.  Skin: Negative for color change, pallor, rash, hair loss, nodules/bumps, skin tightness, ulcers and sensitivity to sunlight.  Allergic/Immunologic: Negative for susceptible to infections.  Neurological: Positive for headaches. Negative for dizziness, numbness and weakness.  Hematological: Negative for swollen glands.  Psychiatric/Behavioral: Positive for sleep disturbance. Negative for depressed mood. The patient is not nervous/anxious.     PMFS History:  Patient Active Problem List   Diagnosis Date Noted  . Primary osteoarthritis of both hands 08/30/2017  . Primary osteoarthritis of both knees 08/30/2017  . DDD (degenerative disc disease), cervical 08/30/2017  . DDD (degenerative disc disease), lumbar 08/30/2017  . History of insomnia 08/30/2017  . Fatty liver 08/30/2017  . History of TIA (transient ischemic attack) 08/30/2017  . S/p bilateral carpal tunnel release 08/30/2017  . History of gastroesophageal reflux (GERD) 08/30/2017  . History of vitamin D deficiency 08/30/2017  . Spondylolisthesis of lumbar region 01/12/2017  . Benign essential hypertension 05/06/2016  . Oral herpes  05/06/2016  . Carpal tunnel syndrome of left wrist 11/08/2015  . Hyperlipidemia, unspecified 05/10/2015  . Osteopenia 11/05/2014  . Closed fracture of fibula 01/09/2014  . Sleep  apnea 01/06/2011  . Restless leg syndrome 01/06/2011  . Insomnia 01/06/2011  . Fibromyalgia 01/06/2011  . GERD (gastroesophageal reflux disease) 12/15/2007    Past Medical History:  Diagnosis Date  . Arthritis   . Broken ankle 11/2013   right ankle  . Cataract    Bil/ surgery on right eye  . Fatty liver   . Fibromyalgia 2005  . GERD (gastroesophageal reflux disease)    uses prilosec  . Headache(784.0)    not as often as before  . Hypercholesteremia   . Hypertension   . Hyperthyroidism   . OSA (obstructive sleep apnea)    CPAP  . Plantar fasciitis   . Post-operative nausea and vomiting   . Restless leg syndrome   . Schatzki's ring   . Spondylolisthesis of lumbar region   . Stress incontinence   . Stroke [facility]) 2015   TIA.  unable to determine if this is true.  . Thyroiditis   . Vertigo     Family History  Problem Relation Age of Onset  . Colon cancer Mother 85  . Lung cancer Father 5  . Colon cancer Maternal Grandfather   . Breast cancer Neg Hx    Past Surgical History:  Procedure Laterality Date  . CARPAL TUNNEL RELEASE Left 12/18/2015   Procedure: CARPAL TUNNEL RELEASE;  Surgeon: Leanor Kail, MD;  Location: [facility] ORS;  Service: Orthopedics;  Laterality: Left;  . CARPAL TUNNEL RELEASE Right 2018  . CATARACT EXTRACTION W/PHACO Right 11/13/2015   Procedure: CATARACT EXTRACTION PHACO AND INTRAOCULAR LENS PLACEMENT (IOC);  Surgeon: Estill Cotta, MD;  Location: [facility] ORS;  Service: Ophthalmology;  Laterality: Right;  [place] 01:08AP% 21.6CDE 26.99Fluid pack lot # [id number]  . CATARACT EXTRACTION W/PHACO Left 12/04/2015   Procedure: CATARACT EXTRACTION PHACO AND INTRAOCULAR LENS PLACEMENT (IOC);  Surgeon: Estill Cotta, MD;  Location: [facility] ORS;  Service: Ophthalmology;  Laterality: Left;  [place] 59.2 AP% 24.4 CDE 29.2 Fluid Pack lot # [id number]  . CERVICAL DISC SURGERY     cervical decompression c4-5 and c5-6  . COLONOSCOPY W/ BIOPSIES AND POLYPECTOMY    . CTR      right hand  . EYE SURGERY Bilateral    cataract   . LUMBAR SPINE SURGERY  2018  . SHOULDER SURGERY     right shoulder  . TRIGGER FINGER RELEASE     right finger  . WISDOM TOOTH EXTRACTION     Social History   Social History Narrative   Married to [place], no natural children   Right handed   Bachelor's degree   2-3 cups daily         Immunization History  Administered Date(s) Administered  . Influenza Inj Mdck Quad Pf 12/11/2016  . Influenza Inj Mdck Quad With Preservative 12/13/2017  . Influenza,inj,Quad PF,6+ Mos 10/29/2018  . Influenza-Unspecified 02/15/2015, 01/01/2016, 12/02/2016  . PFIZER SARS-COV-2 Vaccination 05/11/2019, 06/06/2019  . Tdap 11/14/2007, 04/14/2017     Objective: Vital Signs: BP 115/74 (BP Location: Left Arm, Patient Position: Sitting, Cuff Size: Small)   Pulse 83   Resp 12   Ht 5\' 1"  (1.549 m)   Wt 203 lb 12.8 oz (92.4 kg)   BMI 38.51 kg/m    Physical Exam Vitals and nursing note reviewed.  Constitutional:      Appearance: She is well-developed.  HENT:  Head: Normocephalic and atraumatic.  Eyes:     Conjunctiva/sclera: Conjunctivae normal.  Pulmonary:     Effort: Pulmonary effort is normal.  Abdominal:     General: Bowel sounds are normal.     Palpations: Abdomen is soft.  Musculoskeletal:     Cervical back: Normal range of motion.  Lymphadenopathy:     Cervical: No cervical adenopathy.  Skin:    General: Skin is warm and dry.     Capillary Refill: Capillary refill takes less than 2 seconds.  Neurological:     Mental Status: She is alert and oriented to person, place, and time.  Psychiatric:        Behavior: Behavior normal.      Musculoskeletal Exam: C-spine, thoracic spine, and lumbar spine have good range of motion.  No midline spinal tenderness.  No SI joint tenderness.  Shoulder joints, elbow joints, wrist joints, MCPs, PIPs, DIPs have good range of motion with no synovitis.  She has PIP and DIP thickening consistent  with osteoarthritis of both hands.  Tenderness of the right third PIP joint noted.  Hip joints have good range of motion with no discomfort.  Knee joints have good range of motion with crepitus bilaterally.  No warmth or effusion of the joints noted.  Ankle joints have good range of motion with no tenderness.  She has pedal edema bilaterally.  CDAI Exam: CDAI Score: -- Patient Global: --; Provider Global: -- Swollen: --; Tender: -- Joint Exam 08/30/2019   No joint exam has been documented for this visit   There is currently no information documented on the homunculus. Go to the Rheumatology activity and complete the homunculus joint exam.  Investigation: No additional findings.  Imaging: No results found.  Recent Labs: Lab Results  Component Value Date   WBC 6.8 08/04/2017   HGB 12.5 08/04/2017   PLT 257 08/04/2017   NA 139 08/04/2017   K 4.2 08/04/2017   CL 104 08/04/2017   CO2 29 08/04/2017   GLUCOSE 111 (H) 08/04/2017   BUN 16 08/04/2017   CREATININE 0.83 08/04/2017   BILITOT 0.3 08/04/2017   ALKPHOS 70 01/07/2017   AST 58 (H) 08/04/2017   ALT 75 (H) 08/04/2017   PROT 7.1 08/04/2017   ALBUMIN 4.0 01/07/2017   CALCIUM 9.4 08/04/2017   GFRAA 89 08/04/2017    Speciality Comments: No specialty comments available.  Procedures:  Large Joint Inj: bilateral knee on 08/30/2019 10:27 AM Indications: pain Details: 27 G 1.5 in needle, medial approach  Arthrogram: No  Medications (Right): 1.5 mL lidocaine 1 %; 40 mg triamcinolone acetonide 40 MG/ML Medications (Left): 1.5 mL lidocaine 1 %; 40 mg triamcinolone acetonide 40 MG/ML Outcome: tolerated well, no immediate complications Procedure, treatment alternatives, risks and benefits explained, specific risks discussed. Consent was given by the patient. Immediately prior to procedure a time out was called to verify the correct patient, procedure, equipment, support staff and site/side marked as required. Patient was prepped and  draped in the usual sterile fashion.     Allergies: Codeine and Darvocet [propoxyphene n-acetaminophen]   Assessment / Plan:     Visit Diagnoses: Fibromyalgia: She experiences intermittent myalgias and muscle tenderness due to fibromyalgia.  She continues to take Cymbalta 30 mg 1 capsule by mouth daily, tizanidine 4 mg 1 tablet by mouth at bedtime as needed for muscle spasms, and tramadol 50 mg 1 to 2 tablets by mouth twice daily as needed for pain relief.  Overall she has found this treatment  regimen to be effective.  She does not need any refills at this time.  She has been experiencing increased fatigue recently due to nocturnal pain and and insomnia.  She takes Ambien 10 mg half tablet to 1 tablet by mouth at bedtime as needed for insomnia.  We discussed the importance of good sleep hygiene.  We also discussed the importance of regular exercise.  She has been trying to lose weight recently by dieting and we discussed increasing her exercise regimen.  She will follow-up in the office in 6 months.  Medication monitoring encounter - Tramadol. UDS & narcotic agreement updated today on 08/30/2019.- Plan: DRUG MONITOR, TRAMADOL,QN, URINE, DRUG MONITOR, PANEL 5, W/CONF, URINE  Pain management - She takes tramadol 50 mg 1 to 2 tablets twice daily as needed for pain relief.  Tramadol has been effective at managing her pain.  She is due to update UDS and narcotic agreement today.  Plan: DRUG MONITOR, TRAMADOL,QN, URINE, DRUG MONITOR, PANEL 5, W/CONF, URINE  Primary osteoarthritis of both hands: She has PIP and DIP thickening consistent with osteoarthritis of both hands.  She has tenderness of the right third PIP joint.  We discussed the use of Voltaren gel which she can apply topically.  We also discussed the importance of joint protection and muscle strengthening.  She was encouraged to perform hand exercises on a daily basis.  She was advised to notify us if she develops increased inflammation.  Primary  osteoarthritis of both knees - X-rays from 08/31/2018 were reviewed today in the office.  Images showed severe osteoarthritis of bilateral knee joints worse in the left knee and bilateral severe chondromalacia patella.  She is been experiencing increased pain in both knee joints.  She is not experiencing mechanical symptoms at this time.  She has not had any recent injuries or falls.  She has been gardening more recently which has been exacerbating her discomfort.  She is also been working on weight loss and has eliminated pasta and starches from her diet.  She is also trying to limit her intake of sugar.  We discussed the importance of regular exercise and weight loss.  She was given a handout of knee joint exercises to perform.  She had Orthovisc injections in both knee joints in August 2020 which did not provide any relief.  Due to having an inadequate response to Orthovisc we will apply for Hyalgan injections for both knee joints.  Today she requested bilateral cortisone injections.  She tolerated the procedure well.  The procedure notes were completed above.  Aftercare was discussed.  She was advised to monitor her blood pressure and blood sugar closely following the cortisone injections.  She was encouraged to ice and elevate her knee joints when she is having more discomfort.  We also discussed the use of Voltaren gel which she can apply topically.  She plans on continuing to take tramadol as needed for pain relief.  We discussed in the future if she continues to have persistent pain we will refer her to physical therapy.  DDD (degenerative disc disease), cervical - S/P fusion: She has good range of motion with some discomfort.  No symptoms of radiculopathy at this time.  DDD (degenerative disc disease), lumbar - L3-4, L4-5 fusion January 22, 2017 by Dr. Ellene Route.  She is not experiencing any discomfort in her lower back at this time.  She has no midline spinal tenderness.  She has no symptoms of  radiculopathy.  Other medical conditions are listed as  follows:   RLS (restless legs syndrome)  History of migraine  History of gastroesophageal reflux (GERD)  History of vitamin D deficiency  History of hypertension  History of sleep apnea -  CPAP   History of diabetes mellitus, type II  History of TIA (transient ischemic attack) - questionable, 2015.   Fatty liver    Orders: Orders Placed This Encounter  Procedures  . Large Joint Inj  . DRUG MONITOR, TRAMADOL,QN, URINE  . DRUG MONITOR, PANEL 5, W/CONF, URINE   No orders of the defined types were placed in this encounter.   Face-to-face time spent with patient was 30 minutes. Greater than 50% of time was spent in counseling and coordination of care.  Follow-Up Instructions: Return in about 6 months (around 03/01/2020) for Fibromyalgia, Osteoarthritis.   Ofilia Neas, PA-C  Note - This record has been created using Dragon software.  Chart creation errors have been sought, but may not always  have been located. Such creation errors do not reflect on  the standard of medical care.

## 2019-08-30 ENCOUNTER — Ambulatory Visit (INDEPENDENT_AMBULATORY_CARE_PROVIDER_SITE_OTHER): Payer: PRIVATE HEALTH INSURANCE | Admitting: Physician Assistant

## 2019-08-30 ENCOUNTER — Telehealth: Payer: Self-pay

## 2019-08-30 ENCOUNTER — Encounter: Payer: Self-pay | Admitting: Physician Assistant

## 2019-08-30 ENCOUNTER — Other Ambulatory Visit: Payer: Self-pay

## 2019-08-30 VITALS — BP 115/74 | HR 83 | Resp 12 | Ht 61.0 in | Wt 203.8 lb

## 2019-08-30 DIAGNOSIS — M19042 Primary osteoarthritis, left hand: Secondary | ICD-10-CM

## 2019-08-30 DIAGNOSIS — Z8719 Personal history of other diseases of the digestive system: Secondary | ICD-10-CM

## 2019-08-30 DIAGNOSIS — Z8669 Personal history of other diseases of the nervous system and sense organs: Secondary | ICD-10-CM

## 2019-08-30 DIAGNOSIS — Z8673 Personal history of transient ischemic attack (TIA), and cerebral infarction without residual deficits: Secondary | ICD-10-CM

## 2019-08-30 DIAGNOSIS — G2581 Restless legs syndrome: Secondary | ICD-10-CM

## 2019-08-30 DIAGNOSIS — K760 Fatty (change of) liver, not elsewhere classified: Secondary | ICD-10-CM

## 2019-08-30 DIAGNOSIS — M19041 Primary osteoarthritis, right hand: Secondary | ICD-10-CM

## 2019-08-30 DIAGNOSIS — Z8639 Personal history of other endocrine, nutritional and metabolic disease: Secondary | ICD-10-CM

## 2019-08-30 DIAGNOSIS — M5030 Other cervical disc degeneration, unspecified cervical region: Secondary | ICD-10-CM

## 2019-08-30 DIAGNOSIS — Z5181 Encounter for therapeutic drug level monitoring: Secondary | ICD-10-CM | POA: Diagnosis not present

## 2019-08-30 DIAGNOSIS — M797 Fibromyalgia: Secondary | ICD-10-CM | POA: Diagnosis not present

## 2019-08-30 DIAGNOSIS — M5136 Other intervertebral disc degeneration, lumbar region: Secondary | ICD-10-CM

## 2019-08-30 DIAGNOSIS — Z8679 Personal history of other diseases of the circulatory system: Secondary | ICD-10-CM

## 2019-08-30 DIAGNOSIS — R52 Pain, unspecified: Secondary | ICD-10-CM

## 2019-08-30 DIAGNOSIS — M170 Bilateral primary osteoarthritis of knee: Secondary | ICD-10-CM | POA: Diagnosis not present

## 2019-08-30 MED ORDER — LIDOCAINE HCL 1 % IJ SOLN
1.5000 mL | INTRAMUSCULAR | Status: AC | PRN
  Administered 2019-08-30: 1.5 mL

## 2019-08-30 MED ORDER — TRIAMCINOLONE ACETONIDE 40 MG/ML IJ SUSP
40.0000 mg | INTRAMUSCULAR | Status: AC | PRN
  Administered 2019-08-30: 40 mg via INTRA_ARTICULAR

## 2019-08-30 NOTE — Telephone Encounter (Signed)
Please apply for bilateral knee hyalgan, per Hazel Sams, PA-C. Thanks!

## 2019-08-30 NOTE — Patient Instructions (Signed)
Journal for Nurse Practitioners, 15(4), 263-267. Retrieved November 29, 2017 from http://clinicalkey.com/nursing">  Knee Exercises Ask your health care provider which exercises are safe for you. Do exercises exactly as told by your health care provider and adjust them as directed. It is normal to feel mild stretching, pulling, tightness, or discomfort as you do these exercises. Stop right away if you feel sudden pain or your pain gets worse. Do not begin these exercises until told by your health care provider. Stretching and range-of-motion exercises These exercises warm up your muscles and joints and improve the movement and flexibility of your knee. These exercises also help to relieve pain and swelling. Knee extension, prone 1. Lie on your abdomen (prone position) on a bed. 2. Place your left / right knee just beyond the edge of the surface so your knee is not on the bed. You can put a towel under your left / right thigh just above your kneecap for comfort. 3. Relax your leg muscles and allow gravity to straighten your knee (extension). You should feel a stretch behind your left / right knee. 4. Hold this position for __________ seconds. 5. Scoot up so your knee is supported between repetitions. Repeat __________ times. Complete this exercise __________ times a day. Knee flexion, active  1. Lie on your back with both legs straight. If this causes back discomfort, bend your left / right knee so your foot is flat on the floor. 2. Slowly slide your left / right heel back toward your buttocks. Stop when you feel a gentle stretch in the front of your knee or thigh (flexion). 3. Hold this position for __________ seconds. 4. Slowly slide your left / right heel back to the starting position. Repeat __________ times. Complete this exercise __________ times a day. Quadriceps stretch, prone  1. Lie on your abdomen on a firm surface, such as a bed or padded floor. 2. Bend your left / right knee and hold  your ankle. If you cannot reach your ankle or pant leg, loop a belt around your foot and grab the belt instead. 3. Gently pull your heel toward your buttocks. Your knee should not slide out to the side. You should feel a stretch in the front of your thigh and knee (quadriceps). 4. Hold this position for __________ seconds. Repeat __________ times. Complete this exercise __________ times a day. Hamstring, supine 1. Lie on your back (supine position). 2. Loop a belt or towel over the ball of your left / right foot. The ball of your foot is on the walking surface, right under your toes. 3. Straighten your left / right knee and slowly pull on the belt to raise your leg until you feel a gentle stretch behind your knee (hamstring). ? Do not let your knee bend while you do this. ? Keep your other leg flat on the floor. 4. Hold this position for __________ seconds. Repeat __________ times. Complete this exercise __________ times a day. Strengthening exercises These exercises build strength and endurance in your knee. Endurance is the ability to use your muscles for a long time, even after they get tired. Quadriceps, isometric This exercise stretches the muscles in front of your thigh (quadriceps) without moving your knee joint (isometric). 1. Lie on your back with your left / right leg extended and your other knee bent. Put a rolled towel or small pillow under your knee if told by your health care provider. 2. Slowly tense the muscles in the front of your left /   right thigh. You should see your kneecap slide up toward your hip or see increased dimpling just above the knee. This motion will push the back of the knee toward the floor. 3. For __________ seconds, hold the muscle as tight as you can without increasing your pain. 4. Relax the muscles slowly and completely. Repeat __________ times. Complete this exercise __________ times a day. Straight leg raises This exercise stretches the muscles in front  of your thigh (quadriceps) and the muscles that move your hips (hip flexors). 1. Lie on your back with your left / right leg extended and your other knee bent. 2. Tense the muscles in the front of your left / right thigh. You should see your kneecap slide up or see increased dimpling just above the knee. Your thigh may even shake a bit. 3. Keep these muscles tight as you raise your leg 4-6 inches (10-15 cm) off the floor. Do not let your knee bend. 4. Hold this position for __________ seconds. 5. Keep these muscles tense as you lower your leg. 6. Relax your muscles slowly and completely after each repetition. Repeat __________ times. Complete this exercise __________ times a day. Hamstring, isometric 1. Lie on your back on a firm surface. 2. Bend your left / right knee about __________ degrees. 3. Dig your left / right heel into the surface as if you are trying to pull it toward your buttocks. Tighten the muscles in the back of your thighs (hamstring) to "dig" as hard as you can without increasing any pain. 4. Hold this position for __________ seconds. 5. Release the tension gradually and allow your muscles to relax completely for __________ seconds after each repetition. Repeat __________ times. Complete this exercise __________ times a day. Hamstring curls If told by your health care provider, do this exercise while wearing ankle weights. Begin with __________ lb weights. Then increase the weight by 1 lb (0.5 kg) increments. Do not wear ankle weights that are more than __________ lb. 1. Lie on your abdomen with your legs straight. 2. Bend your left / right knee as far as you can without feeling pain. Keep your hips flat against the floor. 3. Hold this position for __________ seconds. 4. Slowly lower your leg to the starting position. Repeat __________ times. Complete this exercise __________ times a day. Squats This exercise strengthens the muscles in front of your thigh and knee  (quadriceps). 1. Stand in front of a table, with your feet and knees pointing straight ahead. You may rest your hands on the table for balance but not for support. 2. Slowly bend your knees and lower your hips like you are going to sit in a chair. ? Keep your weight over your heels, not over your toes. ? Keep your lower legs upright so they are parallel with the table legs. ? Do not let your hips go lower than your knees. ? Do not bend lower than told by your health care provider. ? If your knee pain increases, do not bend as low. 3. Hold the squat position for __________ seconds. 4. Slowly push with your legs to return to standing. Do not use your hands to pull yourself to standing. Repeat __________ times. Complete this exercise __________ times a day. Wall slides This exercise strengthens the muscles in front of your thigh and knee (quadriceps). 1. Lean your back against a smooth wall or door, and walk your feet out 18-24 inches (46-61 cm) from it. 2. Place your feet hip-width apart. 3.   Slowly slide down the wall or door until your knees bend __________ degrees. Keep your knees over your heels, not over your toes. Keep your knees in line with your hips. 4. Hold this position for __________ seconds. Repeat __________ times. Complete this exercise __________ times a day. Straight leg raises This exercise strengthens the muscles that rotate the leg at the hip and move it away from your body (hip abductors). 1. Lie on your side with your left / right leg in the top position. Lie so your head, shoulder, knee, and hip line up. You may bend your bottom knee to help you keep your balance. 2. Roll your hips slightly forward so your hips are stacked directly over each other and your left / right knee is facing forward. 3. Leading with your heel, lift your top leg 4-6 inches (10-15 cm). You should feel the muscles in your outer hip lifting. ? Do not let your foot drift forward. ? Do not let your knee  roll toward the ceiling. 4. Hold this position for __________ seconds. 5. Slowly return your leg to the starting position. 6. Let your muscles relax completely after each repetition. Repeat __________ times. Complete this exercise __________ times a day. Straight leg raises This exercise stretches the muscles that move your hips away from the front of the pelvis (hip extensors). 1. Lie on your abdomen on a firm surface. You can put a pillow under your hips if that is more comfortable. 2. Tense the muscles in your buttocks and lift your left / right leg about 4-6 inches (10-15 cm). Keep your knee straight as you lift your leg. 3. Hold this position for __________ seconds. 4. Slowly lower your leg to the starting position. 5. Let your leg relax completely after each repetition. Repeat __________ times. Complete this exercise __________ times a day. This information is not intended to replace advice given to you by your health care provider. Make sure you discuss any questions you have with your health care provider. Document Revised: 11/30/2017 Document Reviewed: 11/30/2017 Elsevier Patient Education  2020 [employer]  

## 2019-08-31 NOTE — Telephone Encounter (Signed)
Submitted for VOB on 08/31/19.

## 2019-09-01 LAB — DRUG MONITOR, PANEL 5, W/CONF, URINE
Amphetamines: NEGATIVE ng/mL (ref <500)
Barbiturates: NEGATIVE ng/mL (ref <300)
Benzodiazepines: NEGATIVE ng/mL (ref <100)
Cocaine Metabolite: NEGATIVE ng/mL (ref <150)
Creatinine: 133.7 mg/dL
Marijuana Metabolite: NEGATIVE ng/mL (ref <20)
Methadone Metabolite: NEGATIVE ng/mL (ref <100)
Opiates: NEGATIVE ng/mL (ref <100)
Oxidant: NEGATIVE ug/mL
Oxycodone: NEGATIVE ng/mL (ref <100)
pH: 5.5 (ref 4.5–9.0)

## 2019-09-01 LAB — DM TEMPLATE

## 2019-09-01 LAB — DRUG MONITOR, TRAMADOL,QN, URINE
Desmethyltramadol: 6736 ng/mL — ABNORMAL HIGH (ref <100)
Tramadol: >10000 ng/mL — ABNORMAL HIGH (ref <100)

## 2019-09-01 NOTE — Progress Notes (Signed)
UDS is consistent with treatment.

## 2019-09-05 ENCOUNTER — Other Ambulatory Visit: Payer: Self-pay | Admitting: Physician Assistant

## 2019-09-05 NOTE — Telephone Encounter (Signed)
Last Visit: 08/30/2019 Next Visit: 12/12/2019  Last Fill 07/26/2019  Okay to refill Ambien?

## 2019-09-06 ENCOUNTER — Telehealth: Payer: Self-pay | Admitting: Rheumatology

## 2019-09-06 NOTE — Telephone Encounter (Signed)
Patient left a voicemail stating she was in the office on July 7.  Patient is requesting prescription refill of Zolpidem.  Patient states she filled out the narcotics agreement form that you sent her and will put it in the mail today.  Patient states she did fill out the form in the office when she was there and is not sure what happened to that one.  Patient states she doesn't need a return call unless you need additional information.

## 2019-09-06 NOTE — Telephone Encounter (Signed)
Patient advised her prescription for Zolpidem was sent to the pharmacy yesterday 09/05/2019. Patient advised we sent the narcotic agreement to her because when she was in the office and filled out the form she did not sign it. Patient advised we will need her to fill out and sign form and return to the office.

## 2019-09-12 NOTE — Telephone Encounter (Signed)
Patient has no coverage for Visco injections thru her insurance. How would you like to proceed?

## 2019-09-12 NOTE — Telephone Encounter (Signed)
Attempted to contact the patient and left message for patient to call the office.  

## 2019-09-12 NOTE — Telephone Encounter (Signed)
Patient would not be able to get physical supplement injections.  There are no other options.  She may benefit from physical therapy and muscle strengthening.  Cortisone shots can be used occasionally as needed.

## 2019-10-03 ENCOUNTER — Other Ambulatory Visit: Payer: Self-pay | Admitting: Rheumatology

## 2019-10-03 MED ORDER — TIZANIDINE HCL 4 MG PO CAPS: ORAL_CAPSULE | ORAL | 1 refills | Status: DC

## 2019-10-03 NOTE — Telephone Encounter (Signed)
Last Visit: 08/30/2019 Next Visit: 12/12/2019  Last Fill Ambien: 09/05/2019  Last Fill Tizanidine: 03/02/2019   Okay to refill Ambien and Tizanidine?

## 2019-11-07 ENCOUNTER — Other Ambulatory Visit: Payer: Self-pay | Admitting: Rheumatology

## 2019-11-07 NOTE — Telephone Encounter (Signed)
Last Visit: 08/30/2019 Next Visit: 12/12/2019  Last Fill Ambien: 10/03/2019  Okay to refill Ambien?

## 2019-11-28 NOTE — Progress Notes (Signed)
Office Visit Note  Patient: Carolyn Graham             Date of Birth: 01-08-1956           MRN: [id number]             PCP: Juluis Pitch, MD Referring: Juluis Pitch, MD Visit Date: 12/12/2019 Occupation: @GUAROCC @  Subjective:  Arthritis (Left knee pain)   History of Present Illness: Carolyn Graham is a 64 y.o. female history of fibromyalgia, osteoarthritis.  She states her left knee joint has been hurting again.  She had good response to cortisone injection.  She would like to have another cortisone injection today.  She continues to have some discomfort from fibromyalgia.  She has been taking Cymbalta 30 mg p.o. daily.  She uses Ultram occasionally for increased pain 50 mg p.o. nightly as needed.  She has been taking Zanaflex 4 mg p.o. nightly which helps her to relax and decreases muscle spasms and pain.  She has cut down Ambien to 5 mg p.o. nightly.  She describes her pain level is 0-10 about 6 without tramadol and with tramadol 3.  Activities of Daily Living:  Patient reports morning stiffness for 24 hours.   Patient Denies nocturnal pain.  Difficulty dressing/grooming: Denies Difficulty climbing stairs: Denies Difficulty getting out of chair: Denies Difficulty using hands for taps, buttons, cutlery, and/or writing: Denies  Review of Systems  Constitutional: Positive for fatigue.  HENT: Positive for mouth dryness.   Eyes: Positive for dryness.  Respiratory: Negative for shortness of breath.   Cardiovascular: Positive for swelling in legs/feet.  Gastrointestinal: Positive for constipation and diarrhea.  Endocrine: Positive for heat intolerance, excessive thirst and increased urination.  Genitourinary: Negative for difficulty urinating.  Musculoskeletal: Positive for arthralgias, gait problem, joint pain, morning stiffness and muscle tenderness. Negative for joint swelling and muscle weakness.  Skin: Negative for rash.  Allergic/Immunologic: Negative for susceptible to  infections.  Neurological: Positive for numbness and weakness.  Hematological: Positive for bruising/bleeding tendency.  Psychiatric/Behavioral: Positive for depressed mood and sleep disturbance. The patient is nervous/anxious.     PMFS History:  Patient Active Problem List   Diagnosis Date Noted   Primary osteoarthritis of both hands 08/30/2017   Primary osteoarthritis of both knees 08/30/2017   DDD (degenerative disc disease), cervical 08/30/2017   DDD (degenerative disc disease), lumbar 08/30/2017   History of insomnia 08/30/2017   Fatty liver 08/30/2017   History of TIA (transient ischemic attack) 08/30/2017   S/p bilateral carpal tunnel release 08/30/2017   History of gastroesophageal reflux (GERD) 08/30/2017   History of vitamin D deficiency 08/30/2017   Spondylolisthesis of lumbar region 01/12/2017   Benign essential hypertension 05/06/2016   Oral herpes 05/06/2016   Carpal tunnel syndrome of left wrist 11/08/2015   Hyperlipidemia, unspecified 05/10/2015   Osteopenia 11/05/2014   Closed fracture of fibula 01/09/2014   Sleep apnea 01/06/2011   Restless leg syndrome 01/06/2011   Insomnia 01/06/2011   Fibromyalgia 01/06/2011   GERD (gastroesophageal reflux disease) 12/15/2007    Past Medical History:  Diagnosis Date   Arthritis    Broken ankle 11/2013   right ankle   Cataract    Bil/ surgery on right eye   Diabetes mellitus without complication ([place])    Fatty liver    Fibromyalgia 2005   GERD (gastroesophageal reflux disease)    uses prilosec   Headache(784.0)    not as often as before   Hypercholesteremia    Hypertension  Hyperthyroidism    OSA (obstructive sleep apnea)    CPAP   Plantar fasciitis    Post-operative nausea and vomiting    Restless leg syndrome    Schatzki's ring    Spondylolisthesis of lumbar region    Stress incontinence    Stroke ([place]) 2015   TIA.  unable to determine if this is true.    Thyroiditis    Vertigo     Family History  Problem Relation Age of Onset   Colon cancer Mother 80   Lung cancer Father 65   Colon cancer Maternal Grandfather    Breast cancer Neg Hx    Past Surgical History:  Procedure Laterality Date   CARPAL TUNNEL RELEASE Left 12/18/2015   Procedure: CARPAL TUNNEL RELEASE;  Surgeon: Leanor Kail, MD;  Location: [facility] ORS;  Service: Orthopedics;  Laterality: Left;   CARPAL TUNNEL RELEASE Right 2018   CATARACT EXTRACTION W/PHACO Right 11/13/2015   Procedure: CATARACT EXTRACTION PHACO AND INTRAOCULAR LENS PLACEMENT ([place]);  Surgeon: Estill Cotta, MD;  Location: [facility] ORS;  Service: Ophthalmology;  Laterality: Right;  [place] 01:08AP% 21.6CDE 26.99Fluid pack lot # [id number]   CATARACT EXTRACTION W/PHACO Left 12/04/2015   Procedure: CATARACT EXTRACTION PHACO AND INTRAOCULAR LENS PLACEMENT ([facility]);  Surgeon: Estill Cotta, MD;  Location: [facility] OR[facility];  Service: Ophthalmology;  Laterality: Left;  [place] 59.2 AP% 24.4 CDE 29.2 Fluid Pack lot # [id number]   CERVICAL DISC SURGERY     cervical decompression c4-5 and c5-6   COLONOSCOPY W/ BIOPSIES AND POLYPECTOMY     CTR     right hand   EYE SURGERY Bilateral    cataract    LUMBAR SPINE SURGERY  2018   SHOULDER SURGERY     right shoulder   TRIGGER FINGER RELEASE     right finger   WISDOM TOOTH EXTRACTION     Social History   Social History Narrative   Married to [place], no natural children   Right handed   Bachelor's degree   2-3 cups daily         Immunization History  Administered Date(s) Administered   Influenza Inj Mdck Quad Pf 12/11/2016   Influenza Inj Mdck Quad With Preservative 12/13/2017   Influenza,inj,Quad PF,6+ Mos 10/29/2018   Influenza-Unspecified 02/15/2015, 01/01/2016, 12/02/2016   PFIZER SARS-COV-2 Vaccination 05/11/2019, 06/06/2019, 12/11/2019   Tdap 11/14/2007, 04/14/2017     Objective: Vital Signs: BP 135/80 (BP Location: Left Arm, Patient Position:  Sitting, Cuff Size: Normal)    Pulse 80    Resp 16    Ht 5\' 1"  (1.549 m)    Wt 204 lb 9.6 oz (92.8 kg)    BMI 38.66 kg/m    Physical Exam Vitals and nursing note reviewed.  Constitutional:      Appearance: She is well-developed.  HENT:     Head: Normocephalic and atraumatic.  Eyes:     Conjunctiva/sclera: Conjunctivae normal.  Cardiovascular:     Rate and Rhythm: Normal rate and regular rhythm.     Heart sounds: Normal heart sounds.  Pulmonary:     Effort: Pulmonary effort is normal.     Breath sounds: Normal breath sounds.  Abdominal:     General: Bowel sounds are normal.     Palpations: Abdomen is soft.  Musculoskeletal:     Cervical back: Normal range of motion.  Lymphadenopathy:     Cervical: No cervical adenopathy.  Skin:    General: Skin is warm and dry.     Capillary Refill: Capillary refill  takes less than 2 seconds.  Neurological:     Mental Status: She is alert and oriented to person, place, and time.  Psychiatric:        Behavior: Behavior normal.      Musculoskeletal Exam: She has limited range of motion of her cervical spine and lumbar spine.  Shoulder joints, elbow joints, wrist joints with good range of motion.  She has PIP and DIP thickening with no synovitis.  Hip joints and knee joints in good range of motion.  She has some warmth on palpation of her left knee joint.  There was no tenderness over ankles or MTPs.  CDAI Exam: CDAI Score: -- Patient Global: --; Provider Global: -- Swollen: --; Tender: -- Joint Exam 12/12/2019   No joint exam has been documented for this visit   There is currently no information documented on the homunculus. Go to the Rheumatology activity and complete the homunculus joint exam.  Investigation: No additional findings.  Imaging: No results found.  Recent Labs: Lab Results  Component Value Date   WBC 6.8 08/04/2017   HGB 12.5 08/04/2017   PLT 257 08/04/2017   NA 139 08/04/2017   K 4.2 08/04/2017   CL 104  08/04/2017   CO2 29 08/04/2017   GLUCOSE 111 (H) 08/04/2017   BUN 16 08/04/2017   CREATININE 0.83 08/04/2017   BILITOT 0.3 08/04/2017   ALKPHOS 70 01/07/2017   AST 58 (H) 08/04/2017   ALT 75 (H) 08/04/2017   PROT 7.1 08/04/2017   ALBUMIN 4.0 01/07/2017   CALCIUM 9.4 08/04/2017   GFRAA 89 08/04/2017    Speciality Comments: No specialty comments available.  Procedures:  Large Joint Inj: L knee on 12/12/2019 3:19 PM Indications: pain Details: 27 G 1.5 in needle, medial approach  Arthrogram: No  Medications: 40 mg triamcinolone acetonide 40 MG/ML; 1.5 mL lidocaine 1 % Aspirate: 0 mL Outcome: tolerated well, no immediate complications Procedure, treatment alternatives, risks and benefits explained, specific risks discussed. Consent was given by the patient. Immediately prior to procedure a time out was called to verify the correct patient, procedure, equipment, support staff and site/side marked as required. Patient was prepped and draped in the usual sterile fashion.     Allergies: Codeine and Darvocet [propoxyphene n-acetaminophen]   Assessment / Plan:     Visit Diagnoses: Fibromyalgia -she continues to have some discomfort from fibromyalgia.  She is on Cymbalta 30 mg p.o. daily, tizanidine 4 mg p.o. nightly, tramadol 50 mg p.o. daily as needed, ambien 5 mg p.o. nightly as needed.  She states she does quite well on the combination.  Her pain level is significant and is relieved by the medications.  Side effects of all above medications were discussed.  Pain management - tramadol 50 mg 1 to 2 tablets twice daily as needed for pain relief.  She has been taking it only at bedtime.  Side effects of tramadol were reviewed.  We will change the dose to bedtime.  UDS: 08/30/2019, narcotic agreement: 09/06/2019  Primary osteoarthritis of both hands-joint protection muscle strengthening was discussed.  Primary osteoarthritis of both knees - Images showed severe osteoarthritis of bilateral  knee joints worse in the left knee and bilateral severe chondromalacia patella.  Risk supplement injections were declined by her insurance.  She had pain and discomfort in her left knee joint and some warmth on palpation today.  Per her request the left knee joint was injected with cortisone as described above.  She tolerated the procedure well.  She is going to [place] and will be back after 6 months.  DDD (degenerative disc disease), cervical - S/P fusion.  She has limited range of motion.  DDD (degenerative disc disease), lumbar - L3-4, L4-5 fusion January 22, 2017 by Dr. Ellene Route.  She has chronic discomfort in her lower back.  RLS (restless legs syndrome)  History of gastroesophageal reflux (GERD)  History of migraine  History of hypertension-blood pressure is stable.  She has been advised to monitor blood pressure closely after the cortisone shot.  History of vitamin D deficiency  Fatty liver  History of diabetes mellitus, type II-I have advised her to monitor blood sugar closely after the cortisone injection.  History of sleep apnea  History of TIA (transient ischemic attack) - questionable, 2015.  Educated about COVID-19 virus infection-patient is fully vaccinated against COVID-19.  She also received a booster.  Use of mask, social distancing and hand hygiene was discussed.  Use of monoclonal antibodies were discussed in case she develops that COVID-19 infection.  Orders: Orders Placed This Encounter  Procedures   Large Joint Inj   Meds ordered this encounter  Medications   zolpidem (AMBIEN) 5 MG tablet    Sig: Take 1 tablet (5 mg total) by mouth at bedtime as needed for sleep.    Dispense:  30 tablet    Refill:  2   traMADol (ULTRAM) 50 MG tablet    Sig: Take 1 tablet (50 mg total) by mouth at bedtime as needed.    Dispense:  30 tablet    Refill:  2     Follow-Up Instructions: Return in about 6 months (around 06/11/2020) for Osteoarthritis.   Bo Merino,  MD  Note - This record has been created using Editor, commissioning.  Chart creation errors have been sought, but may not always  have been located. Such creation errors do not reflect on  the standard of medical care.

## 2019-12-12 ENCOUNTER — Ambulatory Visit: Payer: PRIVATE HEALTH INSURANCE | Admitting: Rheumatology

## 2019-12-12 ENCOUNTER — Encounter: Payer: Self-pay | Admitting: Rheumatology

## 2019-12-12 ENCOUNTER — Other Ambulatory Visit: Payer: Self-pay

## 2019-12-12 VITALS — BP 135/80 | HR 80 | Resp 16 | Ht 61.0 in | Wt 204.6 lb

## 2019-12-12 DIAGNOSIS — G2581 Restless legs syndrome: Secondary | ICD-10-CM

## 2019-12-12 DIAGNOSIS — M19041 Primary osteoarthritis, right hand: Secondary | ICD-10-CM | POA: Diagnosis not present

## 2019-12-12 DIAGNOSIS — M170 Bilateral primary osteoarthritis of knee: Secondary | ICD-10-CM | POA: Diagnosis not present

## 2019-12-12 DIAGNOSIS — Z8669 Personal history of other diseases of the nervous system and sense organs: Secondary | ICD-10-CM

## 2019-12-12 DIAGNOSIS — Z8673 Personal history of transient ischemic attack (TIA), and cerebral infarction without residual deficits: Secondary | ICD-10-CM

## 2019-12-12 DIAGNOSIS — M797 Fibromyalgia: Secondary | ICD-10-CM

## 2019-12-12 DIAGNOSIS — M5136 Other intervertebral disc degeneration, lumbar region: Secondary | ICD-10-CM

## 2019-12-12 DIAGNOSIS — M19042 Primary osteoarthritis, left hand: Secondary | ICD-10-CM

## 2019-12-12 DIAGNOSIS — Z8679 Personal history of other diseases of the circulatory system: Secondary | ICD-10-CM

## 2019-12-12 DIAGNOSIS — K760 Fatty (change of) liver, not elsewhere classified: Secondary | ICD-10-CM

## 2019-12-12 DIAGNOSIS — Z7189 Other specified counseling: Secondary | ICD-10-CM

## 2019-12-12 DIAGNOSIS — M5030 Other cervical disc degeneration, unspecified cervical region: Secondary | ICD-10-CM

## 2019-12-12 DIAGNOSIS — R52 Pain, unspecified: Secondary | ICD-10-CM | POA: Diagnosis not present

## 2019-12-12 DIAGNOSIS — Z8719 Personal history of other diseases of the digestive system: Secondary | ICD-10-CM

## 2019-12-12 DIAGNOSIS — Z8639 Personal history of other endocrine, nutritional and metabolic disease: Secondary | ICD-10-CM

## 2019-12-12 MED ORDER — ZOLPIDEM TARTRATE 5 MG PO TABS: 5.0000 mg | ORAL_TABLET | Freq: Every evening | ORAL | 2 refills | Status: DC | PRN

## 2019-12-12 MED ORDER — LIDOCAINE HCL 1 % IJ SOLN
1.5000 mL | INTRAMUSCULAR | Status: AC | PRN
  Administered 2019-12-12: 1.5 mL

## 2019-12-12 MED ORDER — TRIAMCINOLONE ACETONIDE 40 MG/ML IJ SUSP
40.0000 mg | INTRAMUSCULAR | Status: AC | PRN
  Administered 2019-12-12: 40 mg via INTRA_ARTICULAR

## 2019-12-12 MED ORDER — TRAMADOL HCL 50 MG PO TABS: 50.0000 mg | ORAL_TABLET | Freq: Every evening | ORAL | 2 refills | Status: DC | PRN

## 2020-01-29 ENCOUNTER — Ambulatory Visit (INDEPENDENT_AMBULATORY_CARE_PROVIDER_SITE_OTHER): Payer: PRIVATE HEALTH INSURANCE | Admitting: Internal Medicine

## 2020-01-29 VITALS — Ht 61.0 in | Wt 204.0 lb

## 2020-01-29 DIAGNOSIS — Z7189 Other specified counseling: Secondary | ICD-10-CM

## 2020-01-29 DIAGNOSIS — G4730 Sleep apnea, unspecified: Secondary | ICD-10-CM | POA: Diagnosis not present

## 2020-01-29 DIAGNOSIS — G2581 Restless legs syndrome: Secondary | ICD-10-CM

## 2020-01-29 NOTE — Patient Instructions (Signed)

## 2020-01-29 NOTE — Progress Notes (Signed)
[facility] Carolyn Graham, [place] [id number]  Pulmonary Sleep Medicine   Office Visit Note  Patient Name: Carolyn Graham DOB: [birth year] MRN [id number]  I connected with  Carolyn Graham on 01/29/20 by a video enabled telemedicine application and verified that I am speaking with the correct person using two identifiers.   I discussed the limitations of evaluation and management by telemedicine. The patient expressed understanding and agreed to proceed.     Chief Complaint: Obstructive Sleep Apnea visit  Brief History:  Carolyn Graham is seen today for follow up The patient has a 6 year  history of sleep apnea. Patient is  using PAP nightly.  The patient feels more rested after sleeping with PAP.  The patient reports benefiting  from PAP use. Reported sleepiness is  Improved from last year and the Epworth Sleepiness Score is 11 out of 24. The patient rarely take naps. The patient complains of the following: dry mouth  The compliance download shows excellent compliance with an average use time of 7.3 hours. The AHI is 2.6  The patient does not of limb movements disrupting sleep. Her husband reports leg twitching in her sleep.  ROS  General: (-) fever, (-) chills, (-) night sweat Nose and Sinuses: (-) nasal stuffiness or itchiness, (-) postnasal drip, (-) nosebleeds, (-) sinus trouble. Mouth and Throat: (-) sore throat, (-) hoarseness. Neck: (-) swollen glands, (-) enlarged thyroid, (-) neck pain. Respiratory: - cough, - shortness of breath, - wheezing. Neurologic: - numbness, - tingling. Psychiatric: - anxiety, - depression   Current Medication: Outpatient Encounter Medications as of 01/29/2020  Medication Sig  . alendronate (FOSAMAX) 70 MG tablet TAKE 1 TABLET BY MOUTH EVERY 7 DAYS WITH A FULL GLASS OF WATER. DO NOT LIE DOWN FOR THE NEXT 30 MINUTES  . aspirin 81 MG tablet Take 81 mg by mouth daily. (Patient not taking: Reported on 12/12/2019)  .  bisoprolol-hydrochlorothiazide (ZIAC) 2.5-6.25 MG tablet Take 1 tablet by mouth daily.  . Calcium Carbonate-Vitamin D (CALCIUM 600+D PO) Take 1 tablet daily by mouth.   . Cholecalciferol (VITAMIN D) 2000 units CAPS Take 2,000 Units daily by mouth.   . diclofenac sodium (VOLTAREN) 1 % GEL APPLY 3 GRAMS TO 3 LARGE JOINTS THREE TIMES DAILY AS NEEDED. (Patient taking differently: APPLY 3 GRAMS TO 3 LARGE JOINTS THREE TIMES DAILY AS NEEDED FOR PAIN)  . DULoxetine (CYMBALTA) 30 MG capsule TAKE 1 CAPSULE(30 MG) BY MOUTH DAILY  . MAGNESIUM ASPARTATE PO Take 600 mg by mouth daily.   . metFORMIN (GLUCOPHAGE-XR) 750 MG 24 hr tablet TAKE 1 TABLET BY MOUTH DAILY FOR 4 TO 5 DAYS, THEN 1 TABLET TWICE DAILY IF NO SIDE EFFECTS  . omeprazole (PRILOSEC) 20 MG capsule Take 20 mg by mouth daily  . Potassium Gluconate 550 MG TABS Take by mouth every other day.  . pramipexole (MIRAPEX) 0.5 MG tablet Take 1.5 mg by mouth 90 minutes before bedtime  . rosuvastatin (CRESTOR) 10 MG tablet Take 10 mg daily by mouth.  . SUMAtriptan (IMITREX) 100 MG tablet Take 100 mg every 2 (two) hours as needed by mouth for migraine or headache.   Marland Kitchen tiZANidine (ZANAFLEX) 4 MG capsule TAKE 1 CAPSULE(4 MG) BY MOUTH AT BEDTIME AS NEEDED FOR MUSCLE SPASMS  . TRADJENTA 5 MG TABS tablet Take 5 mg by mouth daily.  . traMADol (ULTRAM) 50 MG tablet Take 1 tablet (50 mg total) by mouth at bedtime as needed.  . valACYclovir (VALTREX) 1000 MG  tablet Take 1,000 mg daily as needed by mouth (for fever blisters).   . vitamin B-12 (CYANOCOBALAMIN) 1000 MCG tablet Take 1,000 mcg by mouth daily.  Marland Kitchen zolpidem (AMBIEN) 5 MG tablet Take 1 tablet (5 mg total) by mouth at bedtime as needed for sleep.   Facility-Administered Encounter Medications as of 01/29/2020  Medication  . triamcinolone acetonide (KENALOG) 10 MG/ML injection 10 mg    Surgical History: Past Surgical History:  Procedure Laterality Date  . CARPAL TUNNEL RELEASE Left 12/18/2015   Procedure:  CARPAL TUNNEL RELEASE;  Surgeon: Leanor Kail, MD;  Location: [facility] ORS;  Service: Orthopedics;  Laterality: Left;  . CARPAL TUNNEL RELEASE Right 2018  . CATARACT EXTRACTION W/PHACO Right 11/13/2015   Procedure: CATARACT EXTRACTION PHACO AND INTRAOCULAR LENS PLACEMENT (IOC);  Surgeon: Estill Cotta, MD;  Location: [facility] ORS;  Service: Ophthalmology;  Laterality: Right;  [place] 01:08AP% 21.6CDE 26.99Fluid pack lot # [id number]  . CATARACT EXTRACTION W/PHACO Left 12/04/2015   Procedure: CATARACT EXTRACTION PHACO AND INTRAOCULAR LENS PLACEMENT (IOC);  Surgeon: Estill Cotta, MD;  Location: [facility] ORS;  Service: Ophthalmology;  Laterality: Left;  [place] 59.2 AP% 24.4 CDE 29.2 Fluid Pack lot # [id number]  . CERVICAL DISC SURGERY     cervical decompression c4-5 and c5-6  . COLONOSCOPY W/ BIOPSIES AND POLYPECTOMY    . CTR     right hand  . EYE SURGERY Bilateral    cataract   . LUMBAR SPINE SURGERY  2018  . SHOULDER SURGERY     right shoulder  . TRIGGER FINGER RELEASE     right finger  . WISDOM TOOTH EXTRACTION      Medical History: Past Medical History:  Diagnosis Date  . Arthritis   . Broken ankle 11/2013   right ankle  . Cataract    Bil/ surgery on right eye  . Diabetes mellitus without complication ([place])   . Fatty liver   . Fibromyalgia 2005  . GERD (gastroesophageal reflux disease)    uses prilosec  . Headache(784.0)    not as often as before  . Hypercholesteremia   . Hypertension   . Hyperthyroidism   . OSA (obstructive sleep apnea)    CPAP  . Plantar fasciitis   . Post-operative nausea and vomiting   . Restless leg syndrome   . Schatzki's ring   . Spondylolisthesis of lumbar region   . Stress incontinence   . Stroke [facility]) 2015   TIA.  unable to determine if this is true.  . Thyroiditis   . Vertigo     Family History: Non contributory to the present illness  Social History: Social History   Socioeconomic History  . Marital status: Married    Spouse name: Not on  file  . Number of children: 0  . Years of education: Not on file  . Highest education level: Not on file  Occupational History  . Occupation: Film/video editor: [employer]  Tobacco Use  . Smoking status: Never Smoker  . Smokeless tobacco: Never Used  Vaping Use  . Vaping Use: Never used  Substance and Sexual Activity  . Alcohol use: Yes    Comment: OCCAS  . Drug use: No  . Sexual activity: Yes    Partners: Female  Other Topics Concern  . Not on file  Social History Narrative   Married to [place], no natural children   Right handed   Bachelor's degree   2-3 cups daily  Social Determinants of Health   Financial Resource Strain:   . Difficulty of Paying Living Expenses: Not on file  Food Insecurity:   . Worried About Charity fundraiser in the Last Year: Not on file  . Ran Out of Food in the Last Year: Not on file  Transportation Needs:   . Lack of Transportation (Medical): Not on file  . Lack of Transportation (Non-Medical): Not on file  Physical Activity:   . Days of Exercise per Week: Not on file  . Minutes of Exercise per Session: Not on file  Stress:   . Feeling of Stress : Not on file  Social Connections:   . Frequency of Communication with Friends and Family: Not on file  . Frequency of Social Gatherings with Friends and Family: Not on file  . Attends Religious Services: Not on file  . Active Member of Clubs or Organizations: Not on file  . Attends Archivist Meetings: Not on file  . Marital Status: Not on file  Intimate Partner Violence:   . Fear of Current or Ex-Partner: Not on file  . Emotionally Abused: Not on file  . Physically Abused: Not on file  . Sexually Abused: Not on file    Vital Signs: Height 5\' 1"  (1.549 m), weight 204 lb (92.5 kg).  Examination: General Appearance: The patient is well-developed, well-nourished, and in no distress. Neck Circumference:  Skin: Gross inspection of skin unremarkable. Head:  normocephalic, no gross deformities. Eyes: no gross deformities noted. ENT: ears appear grossly normal Neurologic: Alert and oriented. No involuntary movements.    EPWORTH SLEEPINESS SCALE:  Scale:  (0)= no chance of dozing; (1)= slight chance of dozing; (2)= moderate chance of dozing; (3)= high chance of dozing  Chance  Situtation    Sitting and reading: 3    Watching TV: 0    Sitting Inactive in public: 0    As a passenger in car: 2      Lying down to rest: 3    Sitting and talking: 0    Sitting quielty after lunch: 3    In a car, stopped in traffic: 0   TOTAL SCORE:   11 out of 24    SLEEP STUDIES:  1. Split 08/03/13-AHI  42.8,  Low SpO2 88%   CPAP COMPLIANCE DATA:  Date Range: 12/27/19 - 01/25/20  Average Daily Use: 7:16 hours  Median Use: 7:28  Compliance for > 4 Hours: 97%  AHI: 2.6 respiratory events per hour  Days Used: 30/30  Mask Leak: 6.6  95th Percentile Pressure: 18.7 cmH2O         LABS: No results found for this or any previous visit (from the past 2160 hour(s)).  Radiology: MM 3D SCREEN BREAST BILATERAL  Result Date: 05/24/2019 CLINICAL DATA:  Screening. EXAM: DIGITAL SCREENING BILATERAL MAMMOGRAM WITH TOMO AND CAD COMPARISON:  Previous exam(s). ACR Breast Density Category b: There are scattered areas of fibroglandular density. FINDINGS: There are no findings suspicious for malignancy. Images were processed with CAD. IMPRESSION: No mammographic evidence of malignancy. A result letter of this screening mammogram will be mailed directly to the patient. RECOMMENDATION: Screening mammogram in one year. (Code:SM-B-01Y) BI-RADS CATEGORY  1: Negative. Electronically Signed   By: Ammie Ferrier M.D.   On: 05/24/2019 15:09    No results found.  No results found.    Assessment and Plan: Patient Active Problem List   Diagnosis Date Noted  . Primary osteoarthritis of both hands 08/30/2017  .  Primary osteoarthritis of both knees  08/30/2017  . DDD (degenerative disc disease), cervical 08/30/2017  . DDD (degenerative disc disease), lumbar 08/30/2017  . History of insomnia 08/30/2017  . Fatty liver 08/30/2017  . History of TIA (transient ischemic attack) 08/30/2017  . S/p bilateral carpal tunnel release 08/30/2017  . History of gastroesophageal reflux (GERD) 08/30/2017  . History of vitamin D deficiency 08/30/2017  . Spondylolisthesis of lumbar region 01/12/2017  . Benign essential hypertension 05/06/2016  . Oral herpes 05/06/2016  . Carpal tunnel syndrome of left wrist 11/08/2015  . Hyperlipidemia, unspecified 05/10/2015  . Osteopenia 11/05/2014  . Closed fracture of fibula 01/09/2014  . Sleep apnea 01/06/2011  . Restless leg syndrome 01/06/2011  . Insomnia 01/06/2011  . Fibromyalgia 01/06/2011  . GERD (gastroesophageal reflux disease) 12/15/2007      The patient does tolerate PAP and reports significant benefit from PAP use. The patient is caring for her machine as recommended advised to take her ropinerole. T. The compliance is excellent. The AHI is well contrlled.   1. OSA on cpap 2. CPAP couseling-Discussed importance of adequate CPAP use as well as proper care and cleaning techniques of machine and all supplies. 3.   Restless leg syndrome - Miraplex 0.5mg . Takes 1.5 mg by mouth 90 minutes before bedtime. Pt would like Dr. Humphrey Rolls to send script  to [facility] [place]. [address], [place], [place] - ph: [phone], fax [phone].     General Counseling: I have discussed the findings of the evaluation and examination with Katharine Look.  I have also discussed any further diagnostic evaluation thatmay be needed or ordered today. Aisha verbalizes understanding of the findings of todays visit. We also reviewed her medications today and discussed drug interactions and side effects including but not limited excessive drowsiness and altered mental states. We also discussed that there is always a risk not just to her but  also people around her. she has been encouraged to call the office with any questions or concerns that should arise related to todays visit.  No orders of the defined types were placed in this encounter.   This patient was seen by Shannan Harper, AGNP-C in collaboration with Dr. Devona Konig as a part of collaborative care agreement.     I have personally obtained a history, examined the patient, evaluated laboratory and imaging results, formulated the assessment and plan and placed orders.   Richelle Ito Saunders Glance, PhD, FAASM  Diplomate, American Board of Sleep Medicine    Allyne Gee, MD [facility] Diplomate ABMS Pulmonary and Critical Care Medicine Sleep medicine

## 2020-01-29 NOTE — Progress Notes (Signed)
error 

## 2020-01-29 NOTE — Progress Notes (Unsigned)
The problem of recurrent insomnia is discussed. Avoidance of caffeine sources is strongly encouraged. Sleep hygiene issues are reviewed. The use of sedative hypnotics for temporary relief is appropriate; we discussed the addictive nature of these drugs, and a one-time only prescription for prn use of a hypnotic is given, to use no more than 3 times per week for 2-3 weeks.

## 2020-03-26 ENCOUNTER — Other Ambulatory Visit: Payer: Self-pay

## 2020-03-26 MED ORDER — ZOLPIDEM TARTRATE 5 MG PO TABS: 5.0000 mg | ORAL_TABLET | Freq: Every evening | ORAL | 2 refills | Status: DC | PRN

## 2020-03-26 NOTE — Telephone Encounter (Signed)
Patient called requesting prescription refill of Zolpidem and Tizanidine.    Patient states she is in [place] until March and requesting the prescriptions be sent to:    [place]  [address] Phone [phone]

## 2020-03-26 NOTE — Telephone Encounter (Signed)
Last Visit: 12/12/2019 Next Visit: 06/11/2020  Current Dose per office note on 12/12/2019: ambien 5 mg p.o. nightly as needed  Last Fill: 12/12/2019  Okay to refill Ambien?

## 2020-05-30 NOTE — Progress Notes (Signed)
Office Visit Note  Patient: Carolyn Graham             Date of Birth: 06/01/1955           MRN: [id number]             PCP: Juluis Pitch, MD Referring: Juluis Pitch, MD Visit Date: 06/11/2020 Occupation: @GUAROCC @  Subjective:  Left knee joint pain   History of Present Illness: Carolyn Graham is a 65 y.o. female with history of fibromyalgia, osteoarthritis, and DDD.  Patient reports that she was in [place] for about 4 months during the winter months and was able to be more active during that time.  She states that she was able to fly, kayak, and hike on a regular basis which improved her generalized pain and fatigue.  She did have increased difficulty sleeping at night due to nocturnal pain while sleeping on a new mattress in the RV.  She continues to experience chronic lower back pain as well as trochanter bursitis of the right hip.  She presents today with increased pain in the left knee joint.  She noticed improvement in her left knee joint pain after the cortisone injection performed 12/12/2019.  She denies any joint swelling at this time.  She continues to take tramadol as needed and uses Voltaren gel topically as needed for pain relief.  She would like refills of both medications.  She continues to take Cymbalta 30 mg 1 capsule by mouth daily.  She does not feel as though Cymbalta has been helping to manage her fibromyalgia and instead has caused some weight gain.  She would like to discontinue Cymbalta.   Patient reports she was reaching for something in her RV and felt and heard pop on the left side of her ribcage.  She states she figured she had a rib fracture and was not evaluated at that time.  She rested for about 4 weeks and took tramadol and her pain has gradulally improved.  She continues to take fosamax 70 mg 1 tablet once weekly and a calcium and vitamin D as recommended. She states she had an updated DEXA last year ordered by her PCP.    Activities of Daily Living:   Patient reports morning stiffness for 30 minutes.   Patient Reports nocturnal pain.  Difficulty dressing/grooming: Reports Difficulty climbing stairs: Reports Difficulty getting out of chair: Reports Difficulty using hands for taps, buttons, cutlery, and/or writing: Denies  Review of Systems  Constitutional: Positive for fatigue.  HENT: Positive for mouth dryness and nose dryness. Negative for mouth sores.   Eyes: Positive for dryness. Negative for pain and itching.  Respiratory: Negative for shortness of breath and difficulty breathing.   Cardiovascular: Positive for swelling in legs/feet. Negative for chest pain and palpitations.  Gastrointestinal: Negative for blood in stool, constipation and diarrhea.  Endocrine: Negative for increased urination.  Genitourinary: Negative for difficulty urinating.  Musculoskeletal: Positive for arthralgias, joint pain, joint swelling, myalgias, morning stiffness, muscle tenderness and myalgias.  Skin: Negative for color change, rash and redness.  Allergic/Immunologic: Negative for susceptible to infections.  Neurological: Positive for weakness. Negative for dizziness, numbness, headaches and memory loss.  Hematological: Positive for bruising/bleeding tendency.  Psychiatric/Behavioral: Negative for confusion.    PMFS History:  Patient Active Problem List   Diagnosis Date Noted  . Primary osteoarthritis of both hands 08/30/2017  . Primary osteoarthritis of both knees 08/30/2017  . DDD (degenerative disc disease), cervical 08/30/2017  . DDD (degenerative disc disease),  lumbar 08/30/2017  . History of insomnia 08/30/2017  . Fatty liver 08/30/2017  . History of TIA (transient ischemic attack) 08/30/2017  . S/p bilateral carpal tunnel release 08/30/2017  . History of gastroesophageal reflux (GERD) 08/30/2017  . History of vitamin D deficiency 08/30/2017  . Spondylolisthesis of lumbar region 01/12/2017  . Benign essential hypertension 05/06/2016   . Oral herpes 05/06/2016  . Carpal tunnel syndrome of left wrist 11/08/2015  . Hyperlipidemia, unspecified 05/10/2015  . Osteopenia 11/05/2014  . Closed fracture of fibula 01/09/2014  . Sleep apnea 01/06/2011  . Restless leg syndrome 01/06/2011  . Insomnia 01/06/2011  . Fibromyalgia 01/06/2011  . GERD (gastroesophageal reflux disease) 12/15/2007    Past Medical History:  Diagnosis Date  . Arthritis   . Broken ankle 11/2013   right ankle  . Cataract    Bil/ surgery on right eye  . Diabetes mellitus without complication ([place])   . Fatty liver   . Fibromyalgia 2005  . GERD (gastroesophageal reflux disease)    uses prilosec  . Headache(784.0)    not as often as before  . Hypercholesteremia   . Hypertension   . Hyperthyroidism   . OSA (obstructive sleep apnea)    CPAP  . Plantar fasciitis   . Post-operative nausea and vomiting   . Restless leg syndrome   . Schatzki's ring   . Spondylolisthesis of lumbar region   . Stress incontinence   . Stroke [facility]) 2015   TIA.  unable to determine if this is true.  . Thyroiditis   . Vertigo     Family History  Problem Relation Age of Onset  . Colon cancer Mother 58  . Lung cancer Father 64  . Colon cancer Maternal Grandfather   . Breast cancer Neg Hx    Past Surgical History:  Procedure Laterality Date  . CARPAL TUNNEL RELEASE Left 12/18/2015   Procedure: CARPAL TUNNEL RELEASE;  Surgeon: Leanor Kail, MD;  Location: [facility] ORS;  Service: Orthopedics;  Laterality: Left;  . CARPAL TUNNEL RELEASE Right 2018  . CATARACT EXTRACTION W/PHACO Right 11/13/2015   Procedure: CATARACT EXTRACTION PHACO AND INTRAOCULAR LENS PLACEMENT (IOC);  Surgeon: Estill Cotta, MD;  Location: [facility] ORS;  Service: Ophthalmology;  Laterality: Right;  [place] 01:08AP% 21.6CDE 26.99Fluid pack lot # [id number]  . CATARACT EXTRACTION W/PHACO Left 12/04/2015   Procedure: CATARACT EXTRACTION PHACO AND INTRAOCULAR LENS PLACEMENT (IOC);  Surgeon: Estill Cotta, MD;   Location: [facility] ORS;  Service: Ophthalmology;  Laterality: Left;  [place] 59.2 AP% 24.4 CDE 29.2 Fluid Pack lot # [id number]  . CERVICAL DISC SURGERY     cervical decompression c4-5 and c5-6  . COLONOSCOPY W/ BIOPSIES AND POLYPECTOMY    . CTR     right hand  . EYE SURGERY Bilateral    cataract   . LUMBAR SPINE SURGERY  2018  . SHOULDER SURGERY     right shoulder  . TRIGGER FINGER RELEASE     right finger  . WISDOM TOOTH EXTRACTION     Social History   Social History Narrative   Married to [place], no natural children   Right handed   Bachelor's degree   2-3 cups daily         Immunization History  Administered Date(s) Administered  . Influenza Inj Mdck Quad Pf 12/11/2016  . Influenza Inj Mdck Quad With Preservative 12/13/2017  . Influenza,inj,Quad PF,6+ Mos 10/29/2018  . Influenza-Unspecified 02/15/2015, 01/01/2016, 12/02/2016  . PFIZER(Purple Top)SARS-COV-2 Vaccination 05/11/2019, 06/06/2019, 12/11/2019  . Tdap 11/14/2007,  04/14/2017     Objective: Vital Signs: BP 126/83 (BP Location: Left Arm, Patient Position: Sitting, Cuff Size: Normal)   Pulse 76   Ht 5' 1.25" (1.556 m)   Wt 220 lb (99.8 kg)   BMI 41.23 kg/m    Physical Exam Vitals and nursing note reviewed.  Constitutional:      Appearance: She is well-developed.  HENT:     Head: Normocephalic and atraumatic.  Eyes:     Conjunctiva/sclera: Conjunctivae normal.  Pulmonary:     Effort: Pulmonary effort is normal.  Abdominal:     Palpations: Abdomen is soft.  Musculoskeletal:     Cervical back: Normal range of motion.  Skin:    General: Skin is warm and dry.     Capillary Refill: Capillary refill takes less than 2 seconds.  Neurological:     Mental Status: She is alert and oriented to person, place, and time.  Psychiatric:        Behavior: Behavior normal.      Musculoskeletal Exam: C-spine has good range of motion with no discomfort.  Trapezius muscle tension and muscle tenderness bilaterally.   Painful range of motion of the lumbar spine.  Shoulder joints have good range of motion with no discomfort.  Elbow joints, wrist joints, MCPs, PIPs, DIPs have good range of motion with no synovitis.  She is back in DIP thickening consistent with osteoarthritis of both hands.  Complete fist formation bilaterally.  Hip joints have good range of motion with tenderness over bilateral trochanteric bursa.  Knee joints have good range of motion with some warmth of the left knee.  Tenderness over the left pes anserine bursa.  Ankle joints good ROM with no tenderness or swelling. Pedal edema noted bilaterally.    CDAI Exam: CDAI Score: -- Patient Global: --; Provider Global: -- Swollen: --; Tender: -- Joint Exam 06/11/2020   No joint exam has been documented for this visit   There is currently no information documented on the homunculus. Go to the Rheumatology activity and complete the homunculus joint exam.  Investigation: No additional findings.  Imaging: No results found.  Recent Labs: Lab Results  Component Value Date   WBC 6.8 08/04/2017   HGB 12.5 08/04/2017   PLT 257 08/04/2017   NA 139 08/04/2017   K 4.2 08/04/2017   CL 104 08/04/2017   CO2 29 08/04/2017   GLUCOSE 111 (H) 08/04/2017   BUN 16 08/04/2017   CREATININE 0.83 08/04/2017   BILITOT 0.3 08/04/2017   ALKPHOS 70 01/07/2017   AST 58 (H) 08/04/2017   ALT 75 (H) 08/04/2017   PROT 7.1 08/04/2017   ALBUMIN 4.0 01/07/2017   CALCIUM 9.4 08/04/2017   GFRAA 89 08/04/2017    Speciality Comments: No specialty comments available.  Procedures:  Large Joint Inj: L knee on 06/11/2020 2:47 PM Indications: pain Details: 27 G 1.5 in needle, medial approach  Arthrogram: No  Medications: 1.5 mL lidocaine 1 %; 40 mg triamcinolone acetonide 40 MG/ML Aspirate: 0 mL Outcome: tolerated well, no immediate complications Procedure, treatment alternatives, risks and benefits explained, specific risks discussed. Consent was given by the  patient. Immediately prior to procedure a time out was called to verify the correct patient, procedure, equipment, support staff and site/side marked as required. Patient was prepped and draped in the usual sterile fashion.     Allergies: Codeine and Darvocet [propoxyphene n-acetaminophen]   Assessment / Plan:     Visit Diagnoses: Fibromyalgia: She has generalized hyperalgesia and positive  tender points on exam.  Her fibromyalgia was well controlled while she was in [place] for the past 4 months.  She was able to bike, kayak, and hike which she feels alleviated most of her generalized pain and fatigue.  She continues to have chronic lower back pain which was exacerbated by laying on a new mattress in the RV.  She has been taking tramadol 50 mg 1 tablet by mouth daily as needed for pain relief and using Voltaren gel topically as needed.  She continues to take tizanidine 4 mg a mouth at bedtime as needed for muscle spasms.  She continues to take Cymbalta 30 mg 1 capsule by mouth daily but does not feel as though it is providing her any benefit and would like to discontinue.  We discussed a slow titration of the dose starting at taking 1 capsule by mouth every other day for 2 weeks.  She was advised to notify us if she develops any new or worsening symptoms while tapering off of Cymbalta.  We discussed the importance of regular exercise and good sleep hygiene.  She will follow up in 6 months.  Primary osteoarthritis of both hands: She has PIP and DIP thickening consistent with osteoarthritis of both hands.  Complete this formation bilaterally.  No inflammation was noted.  A refill of Voltaren gel was sent to the pharmacy.  Discussed importance of joint protection and muscle strengthening.  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  She has undergone Visco gel injections in the past which did not provide  any relief.  She has good ROM of both knee joints with some discomfort in the left knee joint.  While in [place] for the past 4 months she has been able to be more active biking, hiking, and using her kayak.  She typically would experience increased discomfort in her left knee when bending down to pick up shelves.  She has mild warmth of the left knee and tenderness over the Pes anserine bursa as noted above.  No knee joint effusion was noted.  She had a cortisone injection performed on 12/12/2019 which provided significant pain relief.  She requested a repeat cortisone injection today.  She tolerated procedure well.  Procedure note was completed above.  Aftercare was discussed.  She was given a handout of knee joint exercises to perform.  We discussed the importance of lower extremity muscle strengthening.  A refill of Voltaren gel was also sent to the pharmacy which she can apply topically as needed for pain relief.- Plan: Large Joint Inj: L knee  Chronic pain of left knee - She presents today with increased pain in the left knee joint.  She has good ROM of the left knee with some discomfort.  Mild warmth but no effusion noted.  She has not had any recent falls or injuries.  No mechanical symptoms at this time.  Her knee joint pain is excerbated by bending down to pick up shells/objects.  She requested a left knee joint cortisone injection today.  She tolerated the procedure well.  Aftercare was discussed. Advised to monitor blood pressure and glucose closely following the injection.  She was advised to notify us if her discomfort persists or worsens. Plan: Large Joint Inj: L knee  DDD (degenerative disc disease), cervical: She has limited range of motion with lateral rotation.  She has trapezius muscle tension and tenderness bilaterally.  DDD (degenerative disc disease),  lumbar - L3-4, L4-5 fusion January 22, 2017 by Dr. Ellene Route.  Chronic pain.  Her discomfort was exacerbated by lying on a new mattress  while in her RV for 4 months.  She was given a handout of back exercises to perform.  She will continue taking tramadol 50 mg 1 tablet by mouth daily as needed for pain relief.   Medication monitoring encounter - UDS and narcotic agreement were updated otday.  Refill pending UDS results. Plan: DRUG MONITOR, TRAMADOL,QN, URINE, DRUG MONITOR, PANEL 5, W/CONF, URINE  Other medical conditions are listed as follows:   RLS (restless legs syndrome)  History of gastroesophageal reflux (GERD)  History of migraine  History of hypertension: BP was 126/83.  Advised to monitor blood pressure closely following the cortisone injection.   History of vitamin D deficiency: She is taking a calcium and vitamin D supplement.   Fatty liver  History of diabetes mellitus, type II: Advised to monitor blood glucose closely following cortisone injection.   History of sleep apnea  History of TIA (transient ischemic attack)    Orders: Orders Placed This Encounter  Procedures  . Large Joint Inj: L knee  . DRUG MONITOR, TRAMADOL,QN, URINE  . DRUG MONITOR, PANEL 5, W/CONF, URINE   Meds ordered this encounter  Medications  . diclofenac Sodium (VOLTAREN) 1 % GEL    Sig: Apply 2-4 grams to affected joint 4 times daily as needed.    Dispense:  400 g    Refill:  2      Follow-Up Instructions: Return in about 6 months (around 12/11/2020) for Fibromyalgia, Osteoarthritis, DDD.   Ofilia Neas, PA-C  Note - This record has been created using Dragon software.  Chart creation errors have been sought, but may not always  have been located. Such creation errors do not reflect on  the standard of medical care.

## 2020-06-11 ENCOUNTER — Telehealth: Payer: Self-pay

## 2020-06-11 ENCOUNTER — Ambulatory Visit: Payer: PRIVATE HEALTH INSURANCE | Admitting: Physician Assistant

## 2020-06-11 ENCOUNTER — Encounter: Payer: Self-pay | Admitting: Physician Assistant

## 2020-06-11 ENCOUNTER — Other Ambulatory Visit: Payer: Self-pay

## 2020-06-11 VITALS — BP 126/83 | HR 76 | Ht 61.25 in | Wt 220.0 lb

## 2020-06-11 DIAGNOSIS — K760 Fatty (change of) liver, not elsewhere classified: Secondary | ICD-10-CM

## 2020-06-11 DIAGNOSIS — G8929 Other chronic pain: Secondary | ICD-10-CM | POA: Diagnosis not present

## 2020-06-11 DIAGNOSIS — Z8679 Personal history of other diseases of the circulatory system: Secondary | ICD-10-CM

## 2020-06-11 DIAGNOSIS — Z8719 Personal history of other diseases of the digestive system: Secondary | ICD-10-CM

## 2020-06-11 DIAGNOSIS — M25562 Pain in left knee: Secondary | ICD-10-CM

## 2020-06-11 DIAGNOSIS — Z8669 Personal history of other diseases of the nervous system and sense organs: Secondary | ICD-10-CM

## 2020-06-11 DIAGNOSIS — M797 Fibromyalgia: Secondary | ICD-10-CM

## 2020-06-11 DIAGNOSIS — M5136 Other intervertebral disc degeneration, lumbar region: Secondary | ICD-10-CM

## 2020-06-11 DIAGNOSIS — M1712 Unilateral primary osteoarthritis, left knee: Secondary | ICD-10-CM | POA: Diagnosis not present

## 2020-06-11 DIAGNOSIS — M19042 Primary osteoarthritis, left hand: Secondary | ICD-10-CM

## 2020-06-11 DIAGNOSIS — Z5181 Encounter for therapeutic drug level monitoring: Secondary | ICD-10-CM

## 2020-06-11 DIAGNOSIS — M170 Bilateral primary osteoarthritis of knee: Secondary | ICD-10-CM

## 2020-06-11 DIAGNOSIS — M5030 Other cervical disc degeneration, unspecified cervical region: Secondary | ICD-10-CM

## 2020-06-11 DIAGNOSIS — Z8673 Personal history of transient ischemic attack (TIA), and cerebral infarction without residual deficits: Secondary | ICD-10-CM

## 2020-06-11 DIAGNOSIS — Z8639 Personal history of other endocrine, nutritional and metabolic disease: Secondary | ICD-10-CM

## 2020-06-11 DIAGNOSIS — G2581 Restless legs syndrome: Secondary | ICD-10-CM

## 2020-06-11 DIAGNOSIS — M19041 Primary osteoarthritis, right hand: Secondary | ICD-10-CM

## 2020-06-11 MED ORDER — TRIAMCINOLONE ACETONIDE 40 MG/ML IJ SUSP
40.0000 mg | INTRAMUSCULAR | Status: AC | PRN
  Administered 2020-06-11: 40 mg via INTRA_ARTICULAR

## 2020-06-11 MED ORDER — LIDOCAINE HCL 1 % IJ SOLN
1.5000 mL | INTRAMUSCULAR | Status: AC | PRN
  Administered 2020-06-11: 1.5 mL

## 2020-06-11 MED ORDER — DICLOFENAC SODIUM 1 % EX GEL: CUTANEOUS | 2 refills | Status: DC

## 2020-06-11 NOTE — Telephone Encounter (Signed)
Please refill tramadol pending UDS results, per Hazel Sams, PA-C. Thanks!   UDS and narcotic agreement were updated today, 06/11/2020.

## 2020-06-11 NOTE — Patient Instructions (Signed)
Back Exercises These exercises help to make your trunk and back strong. They also help to keep the lower back flexible. Doing these exercises can help to prevent back pain or lessen existing pain.  If you have back pain, try to do these exercises 2-3 times each day or as told by your doctor.  As you get better, do the exercises once each day. Repeat the exercises more often as told by your doctor.  To stop back pain from coming back, do the exercises once each day, or as told by your doctor. Exercises Single knee to chest Do these steps 3-5 times in a row for each leg: 1. Lie on your back on a firm bed or the floor with your legs stretched out. 2. Bring one knee to your chest. 3. Grab your knee or thigh with both hands and hold them it in place. 4. Pull on your knee until you feel a gentle stretch in your lower back or buttocks. 5. Keep doing the stretch for 10-30 seconds. 6. Slowly let go of your leg and straighten it. Pelvic tilt Do these steps 5-10 times in a row: 1. Lie on your back on a firm bed or the floor with your legs stretched out. 2. Bend your knees so they point up to the ceiling. Your feet should be flat on the floor. 3. Tighten your lower belly (abdomen) muscles to press your lower back against the floor. This will make your tailbone point up to the ceiling instead of pointing down to your feet or the floor. 4. Stay in this position for 5-10 seconds while you gently tighten your muscles and breathe evenly. Cat-cow Do these steps until your lower back bends more easily: 1. Get on your hands and knees on a firm surface. Keep your hands under your shoulders, and keep your knees under your hips. You may put padding under your knees. 2. Let your head hang down toward your chest. Tighten (contract) the muscles in your belly. Point your tailbone toward the floor so your lower back becomes rounded like the back of a cat. 3. Stay in this position for 5 seconds. 4. Slowly lift your  head. Let the muscles of your belly relax. Point your tailbone up toward the ceiling so your back forms a sagging arch like the back of a cow. 5. Stay in this position for 5 seconds.   Press-ups Do these steps 5-10 times in a row: 1. Lie on your belly (face-down) on the floor. 2. Place your hands near your head, about shoulder-width apart. 3. While you keep your back relaxed and keep your hips on the floor, slowly straighten your arms to raise the top half of your body and lift your shoulders. Do not use your back muscles. You may change where you place your hands in order to make yourself more comfortable. 4. Stay in this position for 5 seconds. 5. Slowly return to lying flat on the floor.   Bridges Do these steps 10 times in a row: 1. Lie on your back on a firm surface. 2. Bend your knees so they point up to the ceiling. Your feet should be flat on the floor. Your arms should be flat at your sides, next to your body. 3. Tighten your butt muscles and lift your butt off the floor until your waist is almost as high as your knees. If you do not feel the muscles working in your butt and the back of your thighs, slide your feet   1-2 inches farther away from your butt. 4. Stay in this position for 3-5 seconds. 5. Slowly lower your butt to the floor, and let your butt muscles relax. If this exercise is too easy, try doing it with your arms crossed over your chest.   Belly crunches Do these steps 5-10 times in a row: 1. Lie on your back on a firm bed or the floor with your legs stretched out. 2. Bend your knees so they point up to the ceiling. Your feet should be flat on the floor. 3. Cross your arms over your chest. 4. Tip your chin a little bit toward your chest but do not bend your neck. 5. Tighten your belly muscles and slowly raise your chest just enough to lift your shoulder blades a tiny bit off of the floor. Avoid raising your body higher than that, because it can put too much stress on your  low back. 6. Slowly lower your chest and your head to the floor. Back lifts Do these steps 5-10 times in a row: 1. Lie on your belly (face-down) with your arms at your sides, and rest your forehead on the floor. 2. Tighten the muscles in your legs and your butt. 3. Slowly lift your chest off of the floor while you keep your hips on the floor. Keep the back of your head in line with the curve in your back. Look at the floor while you do this. 4. Stay in this position for 3-5 seconds. 5. Slowly lower your chest and your face to the floor. Contact a doctor if:  Your back pain gets a lot worse when you do an exercise.  Your back pain does not get better 2 hours after you exercise. If you have any of these problems, stop doing the exercises. Do not do them again unless your doctor says it is okay. Get help right away if:  You have sudden, very bad back pain. If this happens, stop doing the exercises. Do not do them again unless your doctor says it is okay. This information is not intended to replace advice given to you by your health care provider. Make sure you discuss any questions you have with your health care provider. Document Revised: 11/04/2017 Document Reviewed: 11/04/2017 Elsevier Patient Education  2021 [employer]ers, 15(4), [id number]. Retrieved November 29, 2017 from [email or web address].com/nursing">  Knee Exercises Ask your health care provider which exercises are safe for you. Do exercises exactly as told by your health care provider and adjust them as directed. It is normal to feel mild stretching, pulling, tightness, or discomfort as you do these exercises. Stop right away if you feel sudden pain or your pain gets worse. Do not begin these exercises until told by your health care provider. Stretching and range-of-motion exercises These exercises warm up your muscles and joints and improve the movement and flexibility of your knee. These exercises also  help to relieve pain and swelling. Knee extension, prone 1. Lie on your abdomen (prone position) on a bed. 2. Place your left / right knee just beyond the edge of the surface so your knee is not on the bed. You can put a towel under your left / right thigh just above your kneecap for comfort. 3. Relax your leg muscles and allow gravity to straighten your knee (extension). You should feel a stretch behind your left / right knee. 4. Hold this position for __________ seconds. 5. Scoot up so your knee is supported between repetitions.  Repeat __________ times. Complete this exercise __________ times a day. Knee flexion, active 1. Lie on your back with both legs straight. If this causes back discomfort, bend your left / right knee so your foot is flat on the floor. 2. Slowly slide your left / right heel back toward your buttocks. Stop when you feel a gentle stretch in the front of your knee or thigh (flexion). 3. Hold this position for __________ seconds. 4. Slowly slide your left / right heel back to the starting position. Repeat __________ times. Complete this exercise __________ times a day.   Quadriceps stretch, prone 1. Lie on your abdomen on a firm surface, such as a bed or padded floor. 2. Bend your left / right knee and hold your ankle. If you cannot reach your ankle or pant leg, loop a belt around your foot and grab the belt instead. 3. Gently pull your heel toward your buttocks. Your knee should not slide out to the side. You should feel a stretch in the front of your thigh and knee (quadriceps). 4. Hold this position for __________ seconds. Repeat __________ times. Complete this exercise __________ times a day.   Hamstring, supine 1. Lie on your back (supine position). 2. Loop a belt or towel over the ball of your left / right foot. The ball of your foot is on the walking surface, right under your toes. 3. Straighten your left / right knee and slowly pull on the belt to raise your leg  until you feel a gentle stretch behind your knee (hamstring). ? Do not let your knee bend while you do this. ? Keep your other leg flat on the floor. 4. Hold this position for __________ seconds. Repeat __________ times. Complete this exercise __________ times a day. Strengthening exercises These exercises build strength and endurance in your knee. Endurance is the ability to use your muscles for a long time, even after they get tired. Quadriceps, isometric This exercise stretches the muscles in front of your thigh (quadriceps) without moving your knee joint (isometric). 1. Lie on your back with your left / right leg extended and your other knee bent. Put a rolled towel or small pillow under your knee if told by your health care provider. 2. Slowly tense the muscles in the front of your left / right thigh. You should see your kneecap slide up toward your hip or see increased dimpling just above the knee. This motion will push the back of the knee toward the floor. 3. For __________ seconds, hold the muscle as tight as you can without increasing your pain. 4. Relax the muscles slowly and completely. Repeat __________ times. Complete this exercise __________ times a day.   Straight leg raises This exercise stretches the muscles in front of your thigh (quadriceps) and the muscles that move your hips (hip flexors). 1. Lie on your back with your left / right leg extended and your other knee bent. 2. Tense the muscles in the front of your left / right thigh. You should see your kneecap slide up or see increased dimpling just above the knee. Your thigh may even shake a bit. 3. Keep these muscles tight as you raise your leg 4-6 inches (10-15 cm) off the floor. Do not let your knee bend. 4. Hold this position for __________ seconds. 5. Keep these muscles tense as you lower your leg. 6. Relax your muscles slowly and completely after each repetition. Repeat __________ times. Complete this exercise  __________ times a day.  Hamstring, isometric 1. Lie on your back on a firm surface. 2. Bend your left / right knee about __________ degrees. 3. Dig your left / right heel into the surface as if you are trying to pull it toward your buttocks. Tighten the muscles in the back of your thighs (hamstring) to "dig" as hard as you can without increasing any pain. 4. Hold this position for __________ seconds. 5. Release the tension gradually and allow your muscles to relax completely for __________ seconds after each repetition. Repeat __________ times. Complete this exercise __________ times a day. Hamstring curls If told by your health care provider, do this exercise while wearing ankle weights. Begin with __________ lb weights. Then increase the weight by 1 lb (0.5 kg) increments. Do not wear ankle weights that are more than __________ lb. 1. Lie on your abdomen with your legs straight. 2. Bend your left / right knee as far as you can without feeling pain. Keep your hips flat against the floor. 3. Hold this position for __________ seconds. 4. Slowly lower your leg to the starting position. Repeat __________ times. Complete this exercise __________ times a day.   Squats This exercise strengthens the muscles in front of your thigh and knee (quadriceps). 1. Stand in front of a table, with your feet and knees pointing straight ahead. You may rest your hands on the table for balance but not for support. 2. Slowly bend your knees and lower your hips like you are going to sit in a chair. ? Keep your weight over your heels, not over your toes. ? Keep your lower legs upright so they are parallel with the table legs. ? Do not let your hips go lower than your knees. ? Do not bend lower than told by your health care provider. ? If your knee pain increases, do not bend as low. 3. Hold the squat position for __________ seconds. 4. Slowly push with your legs to return to standing. Do not use your hands to pull  yourself to standing. Repeat __________ times. Complete this exercise __________ times a day. Wall slides This exercise strengthens the muscles in front of your thigh and knee (quadriceps). 1. Lean your back against a smooth wall or door, and walk your feet out 18-24 inches (46-61 cm) from it. 2. Place your feet hip-width apart. 3. Slowly slide down the wall or door until your knees bend __________ degrees. Keep your knees over your heels, not over your toes. Keep your knees in line with your hips. 4. Hold this position for __________ seconds. Repeat __________ times. Complete this exercise __________ times a day.   Straight leg raises This exercise strengthens the muscles that rotate the leg at the hip and move it away from your body (hip abductors). 1. Lie on your side with your left / right leg in the top position. Lie so your head, shoulder, knee, and hip line up. You may bend your bottom knee to help you keep your balance. 2. Roll your hips slightly forward so your hips are stacked directly over each other and your left / right knee is facing forward. 3. Leading with your heel, lift your top leg 4-6 inches (10-15 cm). You should feel the muscles in your outer hip lifting. ? Do not let your foot drift forward. ? Do not let your knee roll toward the ceiling. 4. Hold this position for __________ seconds. 5. Slowly return your leg to the starting position. 6. Let your muscles relax completely after  each repetition. Repeat __________ times. Complete this exercise __________ times a day.   Straight leg raises This exercise stretches the muscles that move your hips away from the front of the pelvis (hip extensors). 1. Lie on your abdomen on a firm surface. You can put a pillow under your hips if that is more comfortable. 2. Tense the muscles in your buttocks and lift your left / right leg about 4-6 inches (10-15 cm). Keep your knee straight as you lift your leg. 3. Hold this position for  __________ seconds. 4. Slowly lower your leg to the starting position. 5. Let your leg relax completely after each repetition. Repeat __________ times. Complete this exercise __________ times a day. This information is not intended to replace advice given to you by your health care provider. Make sure you discuss any questions you have with your health care provider. Document Revised: 11/30/2017 Document Reviewed: 11/30/2017 Elsevier Patient Education  2021 [employer].

## 2020-06-13 ENCOUNTER — Other Ambulatory Visit: Payer: Self-pay | Admitting: *Deleted

## 2020-06-13 DIAGNOSIS — R52 Pain, unspecified: Secondary | ICD-10-CM

## 2020-06-13 LAB — DRUG MONITOR, PANEL 5, W/CONF, URINE
Amphetamines: NEGATIVE ng/mL (ref <500)
Barbiturates: NEGATIVE ng/mL (ref <300)
Benzodiazepines: NEGATIVE ng/mL (ref <100)
Cocaine Metabolite: NEGATIVE ng/mL (ref <150)
Creatinine: 51.1 mg/dL
Marijuana Metabolite: NEGATIVE ng/mL (ref <20)
Methadone Metabolite: NEGATIVE ng/mL (ref <100)
Opiates: NEGATIVE ng/mL (ref <100)
Oxidant: NEGATIVE ug/mL
Oxycodone: NEGATIVE ng/mL (ref <100)
pH: 5.5 (ref 4.5–9.0)

## 2020-06-13 LAB — DRUG MONITOR, TRAMADOL,QN, URINE
Desmethyltramadol: 719 ng/mL — ABNORMAL HIGH (ref <100)
Tramadol: 3748 ng/mL — ABNORMAL HIGH (ref <100)

## 2020-06-13 LAB — DM TEMPLATE

## 2020-06-13 MED ORDER — TRAMADOL HCL 50 MG PO TABS: 50.0000 mg | ORAL_TABLET | Freq: Every evening | ORAL | 0 refills | Status: DC | PRN

## 2020-06-13 NOTE — Progress Notes (Signed)
UDS is consistent with treatment.

## 2020-06-13 NOTE — Telephone Encounter (Signed)
Last Visit: 06/11/2020 Next Visit: 11/26/2020 UDS: 06/11/2020,UDS is consistent with treatment.   Narc Agreement: 06/11/2020  Last Fill: 12/12/2019  Okay to refill Tramadol?

## 2020-07-03 ENCOUNTER — Other Ambulatory Visit: Payer: Self-pay | Admitting: Rheumatology

## 2020-07-03 ENCOUNTER — Other Ambulatory Visit: Payer: Self-pay | Admitting: *Deleted

## 2020-07-03 MED ORDER — ZOLPIDEM TARTRATE 5 MG PO TABS: 5.0000 mg | ORAL_TABLET | Freq: Every evening | ORAL | 0 refills | Status: DC | PRN

## 2020-07-03 NOTE — Telephone Encounter (Signed)
Next Visit: 11/26/2020  Last Visit: 06/11/2020  Last Fill: 03/26/2020  Dx: Fibromyalgia  Current Dose per office note on 06/11/2020, not mentioned  Okay to refill Ambien?

## 2020-08-01 ENCOUNTER — Other Ambulatory Visit: Payer: Self-pay | Admitting: Rheumatology

## 2020-08-01 ENCOUNTER — Other Ambulatory Visit: Payer: Self-pay | Admitting: Physician Assistant

## 2020-08-01 DIAGNOSIS — R52 Pain, unspecified: Secondary | ICD-10-CM

## 2020-08-01 NOTE — Telephone Encounter (Signed)
Last Visit: 06/11/2020 Next Visit: 11/26/2020 UDS: 06/11/2020,UDS is consistent with treatment.   Narc Agreement: 06/11/2020   Last Fill: 06/13/2020   Okay to refill Tramadol?

## 2020-08-01 NOTE — Telephone Encounter (Signed)
Next Visit: 11/26/2020   Last Visit: 06/11/2020   Last Fill: 07/03/2020   Dx: Fibromyalgia   Current Dose per office note on 06/11/2020, not mentioned   Okay to refill Ambien?

## 2020-09-03 ENCOUNTER — Other Ambulatory Visit: Payer: Self-pay | Admitting: Physician Assistant

## 2020-09-03 NOTE — Telephone Encounter (Signed)
Next Visit: 11/26/2020   Last Visit: 06/11/2020   Last Fill: 08/01/2020   Dx: Fibromyalgia   Current Dose per office note on 06/11/2020, not mentioned   Okay to refill Ambien?

## 2020-10-07 ENCOUNTER — Other Ambulatory Visit: Payer: Self-pay | Admitting: Rheumatology

## 2020-10-07 MED ORDER — ZOLPIDEM TARTRATE 5 MG PO TABS: ORAL_TABLET | ORAL | 0 refills | Status: DC

## 2020-10-07 NOTE — Telephone Encounter (Signed)
Next Visit: 11/26/2020  Last Visit: 06/11/2020  Last Fill: 09/03/2020  DX: Fibromyalgia  Current Dose per office note on 06/11/2020: not specified.    Okay to refill ambien?    I advised patient that ambien refill would be sent in tomorrow (10/08/2020) pending Dr. Arlean Hopping approval. Patient verbalized understanding.   [place] to inquire about tramadol prescription and was advised insurance is only allowing 7 tabs per fill and the medication needs a prior authorization. I advised the pharmacy we have not received any notification via fax or [email or web address] to complete a PA. The pharmacy will re-fax the PA. The patient has filled 7 tabs twice off of the last refill. I have called to advise the patient she can pick up another 7 tabs and we will complete PA once information is received.  Seth Bake- please keep a look out for this PA request. Thanks!

## 2020-10-07 NOTE — Telephone Encounter (Signed)
Patient calling in reference to refills. #1.Patient states insurance has limited her Tramadol rx to 7, and a PA is being required thru the insurance. Insurance is now [employer]. (New insurance info has been entered in patient's chart.)  #2. Patient also needs a refill on Zolpidem sent to [facility]. Patient has one pill left.

## 2020-10-08 NOTE — Telephone Encounter (Signed)
Received notification from [facility] regarding a prior authorization for  Tramadol . Authorization has been APPROVED from 10/08/2020 to 11/06/2020.

## 2020-10-27 ENCOUNTER — Other Ambulatory Visit: Payer: Self-pay | Admitting: Rheumatology

## 2020-10-29 ENCOUNTER — Other Ambulatory Visit: Payer: Self-pay | Admitting: Rheumatology

## 2020-10-29 DIAGNOSIS — M5030 Other cervical disc degeneration, unspecified cervical region: Secondary | ICD-10-CM

## 2020-10-29 DIAGNOSIS — R52 Pain, unspecified: Secondary | ICD-10-CM

## 2020-10-29 DIAGNOSIS — M797 Fibromyalgia: Secondary | ICD-10-CM

## 2020-10-29 DIAGNOSIS — M5136 Other intervertebral disc degeneration, lumbar region: Secondary | ICD-10-CM

## 2020-10-29 NOTE — Telephone Encounter (Signed)
Spoke with patient and advised we are unable to get Tramadol approved for more than 7 days at a time. Patient advised it would be beneficial for her to be referred to pain management so they may take over prescription. Patient is in agreement and referral placed. Patient advised it is to early to refill Ambien at this time.

## 2020-11-04 ENCOUNTER — Other Ambulatory Visit: Payer: Self-pay | Admitting: Rheumatology

## 2020-11-04 NOTE — Telephone Encounter (Signed)
Next Visit: 11/26/2020  Last Visit: 06/11/2020  Last Fill: 10/07/2020  DX: Fibromyalgia  Current Dose per office note on 06/11/2020: not specified.   Okay to refill ambien?

## 2020-11-12 NOTE — Progress Notes (Signed)
Office Visit Note  Patient: Carolyn Graham             Date of Birth: 24-Jul-1955           MRN: [id number]             PCP: Juluis Pitch, MD Referring: Juluis Pitch, MD Visit Date: 11/26/2020 Occupation: @GUAROCC @  Subjective:  Pain in neck.   History of Present Illness: Carolyn Graham is a 65 y.o. female with a history of fibromyalgia, osteoarthritis and degenerative disc disease.  She continues to have pain and discomfort in her neck, shoulders, knees and lower back.  She describes some right-sided radiculopathy with numbness and hot sensation on her right thigh.  She also had discomfort in bilateral trapezius region.  She noted some swelling in her hands and her knees.  She has been experiencing generalized pain from fibromyalgia.  She states the muscle relaxers have been helpful in the past and is requesting a prescription for muscle relaxer.  Activities of Daily Living:  Patient reports morning stiffness for all day. Patient Reports nocturnal pain.  Difficulty dressing/grooming: Denies Difficulty climbing stairs: Reports Difficulty getting out of chair: Denies Difficulty using hands for taps, buttons, cutlery, and/or writing: Denies  Review of Systems  Constitutional:  Positive for fatigue.  HENT:  Positive for mouth dryness. Negative for mouth sores and nose dryness.   Eyes:  Positive for dryness. Negative for pain and itching.  Respiratory:  Negative for shortness of breath and difficulty breathing.   Cardiovascular:  Negative for chest pain and palpitations.  Gastrointestinal:  Positive for diarrhea. Negative for blood in stool and constipation.  Endocrine: Negative for increased urination.  Genitourinary:  Negative for difficulty urinating.  Musculoskeletal:  Positive for joint pain, joint pain, joint swelling, myalgias, morning stiffness, muscle tenderness and myalgias.  Skin:  Negative for color change, rash and redness.  Allergic/Immunologic: Negative for  susceptible to infections.  Neurological:  Positive for numbness and headaches. Negative for dizziness and memory loss.  Hematological:  Positive for bruising/bleeding tendency.  Psychiatric/Behavioral:  Negative for confusion.    PMFS History:  Patient Active Problem List   Diagnosis Date Noted   Primary osteoarthritis of both hands 08/30/2017   Primary osteoarthritis of both knees 08/30/2017   DDD (degenerative disc disease), cervical 08/30/2017   DDD (degenerative disc disease), lumbar 08/30/2017   History of insomnia 08/30/2017   Fatty liver 08/30/2017   History of TIA (transient ischemic attack) 08/30/2017   S/p bilateral carpal tunnel release 08/30/2017   History of gastroesophageal reflux (GERD) 08/30/2017   History of vitamin D deficiency 08/30/2017   Spondylolisthesis of lumbar region 01/12/2017   Benign essential hypertension 05/06/2016   Oral herpes 05/06/2016   Carpal tunnel syndrome of left wrist 11/08/2015   Hyperlipidemia, unspecified 05/10/2015   Osteopenia 11/05/2014   Closed fracture of fibula 01/09/2014   Sleep apnea 01/06/2011   Restless leg syndrome 01/06/2011   Insomnia 01/06/2011   Fibromyalgia 01/06/2011   GERD (gastroesophageal reflux disease) 12/15/2007    Past Medical History:  Diagnosis Date   Arthritis    Broken ankle 11/2013   right ankle   Cataract    Bil/ surgery on right eye   Diabetes mellitus without complication ([place])    Fatty liver    Fibromyalgia 2005   GERD (gastroesophageal reflux disease)    uses prilosec   Headache(784.0)    not as often as before   Hypercholesteremia    Hypertension  Hyperthyroidism    OSA (obstructive sleep apnea)    CPAP   Plantar fasciitis    Post-operative nausea and vomiting    Restless leg syndrome    Schatzki's ring    Spondylolisthesis of lumbar region    Stress incontinence    Stroke ([place]) 2015   TIA.  unable to determine if this is true.   Thyroiditis    Vertigo     Family History   Problem Relation Age of Onset   Colon cancer Mother 8   Lung cancer Father 39   Colon cancer Maternal Grandfather    Breast cancer Neg Hx    Past Surgical History:  Procedure Laterality Date   CARPAL TUNNEL RELEASE Left 12/18/2015   Procedure: CARPAL TUNNEL RELEASE;  Surgeon: Leanor Kail, MD;  Location: [facility] OR[facility];  Service: Orthopedics;  Laterality: Left;   CARPAL TUNNEL RELEASE Right 2018   CATARACT EXTRACTION W/PHACO Right 11/13/2015   Procedure: CATARACT EXTRACTION PHACO AND INTRAOCULAR LENS PLACEMENT ([place]);  Surgeon: Estill Cotta, MD;  Location: [facility] OR[facility];  Service: Ophthalmology;  Laterality: Right;  [place] 01:08AP% 21.6CDE 26.99Fluid pack lot # [id number]   CATARACT EXTRACTION W/PHACO Left 12/04/2015   Procedure: CATARACT EXTRACTION PHACO AND INTRAOCULAR LENS PLACEMENT ([facility]);  Surgeon: Estill Cotta, MD;  Location: [facility];  Service: Ophthalmology;  Laterality: Left;  [place] 59.2 AP% 24.4 CDE 29.2 Fluid Pack lot # [id number]   CERVICAL DISC SURGERY     cervical decompression c4-5 and c5-6   COLONOSCOPY W/ BIOPSIES AND POLYPECTOMY     CTR     right hand   EYE SURGERY Bilateral    cataract    LUMBAR SPINE SURGERY  2018   SHOULDER SURGERY     right shoulder   TRIGGER FINGER RELEASE     right finger   WISDOM TOOTH EXTRACTION     Social History   Social History Narrative   Married to [place], no natural children   Right handed   Bachelor's degree   2-3 cups daily         Immunization History  Administered Date(s) Administered   Influenza Inj Mdck Quad Pf 12/11/2016   Influenza Inj Mdck Quad With Preservative 12/13/2017   Influenza,inj,Quad PF,6+ Mos 10/29/2018   Influenza-Unspecified 02/15/2015, 01/01/2016, 12/02/2016   PFIZER(Purple Top)SARS-COV-2 Vaccination 05/11/2019, 06/06/2019, 12/11/2019   Tdap 11/14/2007, 04/14/2017     Objective: Vital Signs: BP 130/83 (BP Location: Left Arm, Patient Position: Sitting, Cuff Size: Normal)   Pulse 69   Ht 5\' 1"  (1.549  m)   Wt 213 lb (96.6 kg)   BMI 40.25 kg/m    Physical Exam Vitals and nursing note reviewed.  Constitutional:      Appearance: She is well-developed.  HENT:     Head: Normocephalic and atraumatic.  Eyes:     Conjunctiva/sclera: Conjunctivae normal.  Cardiovascular:     Rate and Rhythm: Normal rate and regular rhythm.     Heart sounds: Normal heart sounds.  Pulmonary:     Effort: Pulmonary effort is normal.     Breath sounds: Normal breath sounds.  Abdominal:     General: Bowel sounds are normal.     Palpations: Abdomen is soft.  Musculoskeletal:     Cervical back: Normal range of motion.  Lymphadenopathy:     Cervical: No cervical adenopathy.  Skin:    General: Skin is warm and dry.     Capillary Refill: Capillary refill takes less than 2 seconds.  Neurological:  Mental Status: She is alert and oriented to person, place, and time.  Psychiatric:        Behavior: Behavior normal.     Musculoskeletal Exam: C-spine was in good range of motion.  She had bilateral trapezius spasm.  She had discomfort range of motion of her lumbar spine.  Shoulder joints, elbow joints, wrist joints, MCPs were in good range of motion.  She had bilateral DIP thickening.  Hip joints and knee joints with good range of motion.  She had no tenderness over ankles or MTPs.  CDAI Exam: CDAI Score: -- Patient Global: --; Provider Global: -- Swollen: --; Tender: -- Joint Exam 11/26/2020   No joint exam has been documented for this visit   There is currently no information documented on the homunculus. Go to the Rheumatology activity and complete the homunculus joint exam.  Investigation: No additional findings.  Imaging: No results found.  Recent Labs: Lab Results  Component Value Date   WBC 6.8 08/04/2017   HGB 12.5 08/04/2017   PLT 257 08/04/2017   NA 139 08/04/2017   K 4.2 08/04/2017   CL 104 08/04/2017   CO2 29 08/04/2017   GLUCOSE 111 (H) 08/04/2017   BUN 16 08/04/2017    CREATININE 0.83 08/04/2017   BILITOT 0.3 08/04/2017   ALKPHOS 70 01/07/2017   AST 58 (H) 08/04/2017   ALT 75 (H) 08/04/2017   PROT 7.1 08/04/2017   ALBUMIN 4.0 01/07/2017   CALCIUM 9.4 08/04/2017   GFRAA 89 08/04/2017    Speciality Comments: No specialty comments available.  Procedures:  No procedures performed Allergies: Codeine and Darvocet [propoxyphene n-acetaminophen]   Assessment / Plan:     Visit Diagnoses: Fibromyalgia-she continues to have generalized pain and discomfort.  She had multiple tender points.  She has been on Cymbalta, and tramadol.  She requested a prescription for muscle relaxer.  Indications side effects contraindications of tizanidine were discussed.  She will use tizanidine on as needed basis.  Tizanidine 4 mg p.o. nightly as needed total 30 tablets with no refills were given.  She was referred to pain management for future tramadol prescriptions.  I will send her to integrative therapies for the treatment of fibromyalgia.  Primary osteoarthritis of both hands-she has bilateral PIP and DIP thickening.  Joint protection muscle strengthening was discussed.  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  She continues to have pain and discomfort in the bilateral knee joints.  Some of the exercises were demonstrated in the office.  Weight loss was also discussed.  DDD (degenerative disc disease), cervical-she continues to have neck pain and trapezius spasm.  She has been going to massage therapy twice a week which has been helpful.  DDD (degenerative disc disease), lumbar - L3-4, L4-5 fusion January 22, 2017 by Dr. Ellene Route.  She has been experiencing right-sided radiculopathy.  Core strengthening exercises were demonstrated in the office.  Medication monitoring encounter - tramadol. UDS & narcotic agreement: 06/11/2020. patient was referred to pain management on  11/08/2020.  She would get future refills from pain management.  RLS (restless legs syndrome)  History of migraine  Fatty liver-she has chronic elevation of LFTs.  History of vitamin D deficiency  History of hypertension  History of gastroesophageal reflux (GERD)  History of sleep apnea-she is on Ambien 5 mg p.o. nightly.  Side effects of Ambien were also discussed.  History of diabetes mellitus, type II  History of TIA (transient ischemic attack)  Orders: Orders Placed This Encounter  Procedures   Ambulatory referral to Physical Therapy    Meds ordered this encounter  Medications   tiZANidine (ZANAFLEX) 4 MG capsule    Sig: TAKE 1 CAPSULE(4 MG) BY MOUTH AT BEDTIME AS NEEDED FOR MUSCLE SPASMS    Dispense:  30 capsule    Refill:  0     Follow-Up Instructions: Return in about 6 months (around 05/27/2021) for Osteoarthritis, FMS.   Bo Merino, MD  Note - This record has been created using Editor, commissioning.  Chart creation errors have been sought, but may not always  have been located. Such creation errors do not reflect on  the standard of medical care.

## 2020-11-22 ENCOUNTER — Other Ambulatory Visit: Payer: Self-pay | Admitting: Family Medicine

## 2020-11-22 DIAGNOSIS — Z1231 Encounter for screening mammogram for malignant neoplasm of breast: Secondary | ICD-10-CM

## 2020-11-26 ENCOUNTER — Encounter: Payer: Self-pay | Admitting: Rheumatology

## 2020-11-26 ENCOUNTER — Other Ambulatory Visit: Payer: Self-pay

## 2020-11-26 ENCOUNTER — Ambulatory Visit: Payer: BC Managed Care – PPO | Admitting: Rheumatology

## 2020-11-26 VITALS — BP 130/83 | HR 69 | Ht 61.0 in | Wt 213.0 lb

## 2020-11-26 DIAGNOSIS — M5030 Other cervical disc degeneration, unspecified cervical region: Secondary | ICD-10-CM

## 2020-11-26 DIAGNOSIS — M19042 Primary osteoarthritis, left hand: Secondary | ICD-10-CM

## 2020-11-26 DIAGNOSIS — Z5181 Encounter for therapeutic drug level monitoring: Secondary | ICD-10-CM

## 2020-11-26 DIAGNOSIS — Z8719 Personal history of other diseases of the digestive system: Secondary | ICD-10-CM

## 2020-11-26 DIAGNOSIS — K760 Fatty (change of) liver, not elsewhere classified: Secondary | ICD-10-CM

## 2020-11-26 DIAGNOSIS — M797 Fibromyalgia: Secondary | ICD-10-CM

## 2020-11-26 DIAGNOSIS — Z8673 Personal history of transient ischemic attack (TIA), and cerebral infarction without residual deficits: Secondary | ICD-10-CM

## 2020-11-26 DIAGNOSIS — G2581 Restless legs syndrome: Secondary | ICD-10-CM

## 2020-11-26 DIAGNOSIS — Z8679 Personal history of other diseases of the circulatory system: Secondary | ICD-10-CM

## 2020-11-26 DIAGNOSIS — M5136 Other intervertebral disc degeneration, lumbar region: Secondary | ICD-10-CM

## 2020-11-26 DIAGNOSIS — Z8669 Personal history of other diseases of the nervous system and sense organs: Secondary | ICD-10-CM

## 2020-11-26 DIAGNOSIS — M8589 Other specified disorders of bone density and structure, multiple sites: Secondary | ICD-10-CM

## 2020-11-26 DIAGNOSIS — Z8639 Personal history of other endocrine, nutritional and metabolic disease: Secondary | ICD-10-CM

## 2020-11-26 DIAGNOSIS — M19041 Primary osteoarthritis, right hand: Secondary | ICD-10-CM | POA: Diagnosis not present

## 2020-11-26 DIAGNOSIS — M170 Bilateral primary osteoarthritis of knee: Secondary | ICD-10-CM | POA: Diagnosis not present

## 2020-11-26 MED ORDER — TIZANIDINE HCL 4 MG PO CAPS: ORAL_CAPSULE | ORAL | 0 refills | Status: DC

## 2020-11-26 NOTE — Patient Instructions (Signed)
Back Exercises The following exercises strengthen the muscles that help to support the trunk (torso) and back. They also help to keep the lower back flexible. Doing these exercises can help to prevent or lessen existing low back pain. If you have back pain or discomfort, try doing these exercises 2-3 times each day or as told by your health care provider. As your pain improves, do them once each day, but increase the number of times that you repeat the steps for each exercise (do more repetitions). To prevent the recurrence of back pain, continue to do these exercises once each day or as told by your health care provider. Do exercises exactly as told by your health care provider and adjust them as directed. It is normal to feel mild stretching, pulling, tightness, or discomfort as you do these exercises, but you should stop right away if you feel sudden pain or your pain gets worse. Exercises Single knee to chest Repeat these steps 3-5 times for each leg: Lie on your back on a firm bed or the floor with your legs extended. Bring one knee to your chest. Your other leg should stay extended and in contact with the floor. Hold your knee in place by grabbing your knee or thigh with both hands and hold. Pull on your knee until you feel a gentle stretch in your lower back or buttocks. Hold the stretch for 10-30 seconds. Slowly release and straighten your leg. Pelvic tilt Repeat these steps 5-10 times: Lie on your back on a firm bed or the floor with your legs extended. Bend your knees so they are pointing toward the ceiling and your feet are flat on the floor. Tighten your lower abdominal muscles to press your lower back against the floor. This motion will tilt your pelvis so your tailbone points up toward the ceiling instead of pointing to your feet or the floor. With gentle tension and even breathing, hold this position for 5-10 seconds. Cat-cow Repeat these steps until your lower back becomes more  flexible: Get into a hands-and-knees position on a firm bed or the floor. Keep your hands under your shoulders, and keep your knees under your hips. You may place padding under your knees for comfort. Let your head hang down toward your chest. Contract your abdominal muscles and point your tailbone toward the floor so your lower back becomes rounded like the back of a cat. Hold this position for 5 seconds. Slowly lift your head, let your abdominal muscles relax, and point your tailbone up toward the ceiling so your back forms a sagging arch like the back of a cow. Hold this position for 5 seconds.  Press-ups Repeat these steps 5-10 times: Lie on your abdomen (face-down) on a firm bed or the floor. Place your palms near your head, about shoulder-width apart. Keeping your back as relaxed as possible and keeping your hips on the floor, slowly straighten your arms to raise the top half of your body and lift your shoulders. Do not use your back muscles to raise your upper torso. You may adjust the placement of your hands to make yourself more comfortable. Hold this position for 5 seconds while you keep your back relaxed. Slowly return to lying flat on the floor.  Bridges Repeat these steps 10 times: Lie on your back on a firm bed or the floor. Bend your knees so they are pointing toward the ceiling and your feet are flat on the floor. Your arms should be flat at your   sides, next to your body. Tighten your buttocks muscles and lift your buttocks off the floor until your waist is at almost the same height as your knees. You should feel the muscles working in your buttocks and the back of your thighs. If you do not feel these muscles, slide your feet 1-2 inches (2.5-5 cm) farther away from your buttocks. Hold this position for 3-5 seconds. Slowly lower your hips to the starting position, and allow your buttocks muscles to relax completely. If this exercise is too easy, try doing it with your arms  crossed over your chest. Abdominal crunches Repeat these steps 5-10 times: Lie on your back on a firm bed or the floor with your legs extended. Bend your knees so they are pointing toward the ceiling and your feet are flat on the floor. Cross your arms over your chest. Tip your chin slightly toward your chest without bending your neck. Tighten your abdominal muscles and slowly raise your torso high enough to lift your shoulder blades a tiny bit off the floor. Avoid raising your torso higher than that because it can put too much stress on your lower back and does not help to strengthen your abdominal muscles. Slowly return to your starting position. Back lifts Repeat these steps 5-10 times: Lie on your abdomen (face-down) with your arms at your sides, and rest your forehead on the floor. Tighten the muscles in your legs and your buttocks. Slowly lift your chest off the floor while you keep your hips pressed to the floor. Keep the back of your head in line with the curve in your back. Your eyes should be looking at the floor. Hold this position for 3-5 seconds. Slowly return to your starting position. Contact a health care provider if: Your back pain or discomfort gets much worse when you do an exercise. Your worsening back pain or discomfort does not lessen within 2 hours after you exercise. If you have any of these problems, stop doing these exercises right away. Do not do them again unless your health care provider says that you can. Get help right away if: You develop sudden, severe back pain. If this happens, stop doing the exercises right away. Do not do them again unless your health care provider says that you can. This information is not intended to replace advice given to you by your health care provider. Make sure you discuss any questions you have with your health care provider. Document Revised: 04/24/2020 Document Reviewed: 04/24/2020 Elsevier Patient Education  2022 [place]. Cervical Strain and Sprain Rehab Ask your health care provider which exercises are safe for you. Do exercises exactly as told by your health care provider and adjust them as directed. It is normal to feel mild stretching, pulling, tightness, or discomfort as you do these exercises. Stop right away if you feel sudden pain or your pain gets worse. Do not begin these exercises until told by your health care provider. Stretching and range-of-motion exercises Cervical side bending  Using good posture, sit on a stable chair or stand up. Without moving your shoulders, slowly tilt your left / right ear to your shoulder until you feel a stretch in the opposite side neck muscles. You should be looking straight ahead. Hold for __________ seconds. Repeat with the other side of your neck. Repeat __________ times. Complete this exercise __________ times a day. Cervical rotation  Using good posture, sit on a stable chair or stand up. Slowly turn your head to the side  as if you are looking over your left / right shoulder. Keep your eyes level with the ground. Stop when you feel a stretch along the side and the back of your neck. Hold for __________ seconds. Repeat this by turning to your other side. Repeat __________ times. Complete this exercise __________ times a day. Thoracic extension and pectoral stretch Roll a towel or a small blanket so it is about 4 inches (10 cm) in diameter. Lie down on your back on a firm surface. Put the towel lengthwise, under your spine in the middle of your back. It should not be under your shoulder blades. The towel should line up with your spine from your middle back to your lower back. Put your hands behind your head and let your elbows fall out to your sides. Hold for __________ seconds. Repeat __________ times. Complete this exercise __________ times a day. Strengthening exercises Isometric upper cervical flexion Lie on your back with a thin pillow behind your  head and a small rolled-up towel under your neck. Gently tuck your chin toward your chest and nod your head down to look toward your feet. Do not lift your head off the pillow. Hold for __________ seconds. Release the tension slowly. Relax your neck muscles completely before you repeat this exercise. Repeat __________ times. Complete this exercise __________ times a day. Isometric cervical extension  Stand about 6 inches (15 cm) away from a wall, with your back facing the wall. Place a soft object, about 6-8 inches (15-20 cm) in diameter, between the back of your head and the wall. A soft object could be a small pillow, a ball, or a folded towel. Gently tilt your head back and press into the soft object. Keep your jaw and forehead relaxed. Hold for __________ seconds. Release the tension slowly. Relax your neck muscles completely before you repeat this exercise. Repeat __________ times. Complete this exercise __________ times a day. Posture and body mechanics Body mechanics refers to the movements and positions of your body while you do your daily activities. Posture is part of body mechanics. Good posture and healthy body mechanics can help to relieve stress in your body's tissues and joints. Good posture means that your spine is in its natural S-curve position (your spine is neutral), your shoulders are pulled back slightly, and your head is not tipped forward. The following are general guidelines for applying improved posture and body mechanics to your everyday activities. Sitting  When sitting, keep your spine neutral and keep your feet flat on the floor. Use a footrest, if necessary, and keep your thighs parallel to the floor. Avoid rounding your shoulders, and avoid tilting your head forward. When working at a desk or a computer, keep your desk at a height where your hands are slightly lower than your elbows. Slide your chair under your desk so you are close enough to maintain good  posture. When working at a computer, place your monitor at a height where you are looking straight ahead and you do not have to tilt your head forward or downward to look at the screen. Standing  When standing, keep your spine neutral and keep your feet about hip-width apart. Keep a slight bend in your knees. Your ears, shoulders, and hips should line up. When you do a task in which you stand in one place for a long time, place one foot up on a stable object that is 2-4 inches (5-10 cm) high, such as a footstool. This helps keep your  spine neutral. Resting When lying down and resting, avoid positions that are most painful for you. Try to support your neck in a neutral position. You can use a contour pillow or a small rolled-up towel. Your pillow should support your neck but not push on it. This information is not intended to replace advice given to you by your health care provider. Make sure you discuss any questions you have with your health care provider. Document Revised: 06/01/2018 Document Reviewed: 11/10/2017 Elsevier Patient Education  [place].

## 2020-12-04 ENCOUNTER — Other Ambulatory Visit: Payer: Self-pay | Admitting: Rheumatology

## 2020-12-04 MED ORDER — ZOLPIDEM TARTRATE 5 MG PO TABS: 5.0000 mg | ORAL_TABLET | Freq: Every day | ORAL | 0 refills | Status: DC

## 2020-12-04 NOTE — Telephone Encounter (Signed)
Next Visit: 05/27/2021  Last Visit: 11/26/2020  Last Fill: 11/04/2020  Dx: History of sleep apnea  Current Dose per office note on 11/26/2020:  Ambien 5 mg p.o. nightly.  Okay to refill Ambien?

## 2020-12-06 ENCOUNTER — Ambulatory Visit
Admission: RE | Admit: 2020-12-06 | Discharge: 2020-12-06 | Disposition: A | Discharge status: Home / Self Care | Payer: BC Managed Care – PPO | Source: Ambulatory Visit | Attending: Family Medicine | Admitting: Family Medicine

## 2020-12-06 ENCOUNTER — Other Ambulatory Visit: Payer: Self-pay

## 2020-12-06 DIAGNOSIS — Z1231 Encounter for screening mammogram for malignant neoplasm of breast: Secondary | ICD-10-CM | POA: Diagnosis present

## 2020-12-26 ENCOUNTER — Other Ambulatory Visit: Payer: Self-pay | Admitting: Rheumatology

## 2020-12-27 NOTE — Telephone Encounter (Signed)
Next Visit: 05/27/2021   Last Visit: 11/26/2020   Last Fill: 12/04/2020   Dx: History of sleep apnea   Current Dose per office note on 11/26/2020:  Ambien 5 mg p.o. nightly.   Okay to refill Ambien?

## 2021-01-04 ENCOUNTER — Other Ambulatory Visit: Payer: Self-pay | Admitting: Physician Assistant

## 2021-01-06 NOTE — Telephone Encounter (Signed)
Next Visit: 05/27/2021   Last Visit: 11/26/2020   Last Fill: 12/27/2020   Dx: History of sleep apnea   Current Dose per office note on 11/26/2020:  Ambien 5 mg p.o. nightly.   Okay to refill Ambien?

## 2021-01-17 ENCOUNTER — Encounter: Payer: Self-pay | Admitting: Internal Medicine

## 2021-01-27 ENCOUNTER — Ambulatory Visit (INDEPENDENT_AMBULATORY_CARE_PROVIDER_SITE_OTHER): Payer: PRIVATE HEALTH INSURANCE | Admitting: Internal Medicine

## 2021-01-27 DIAGNOSIS — G4733 Obstructive sleep apnea (adult) (pediatric): Secondary | ICD-10-CM

## 2021-01-27 DIAGNOSIS — E669 Obesity, unspecified: Secondary | ICD-10-CM | POA: Diagnosis not present

## 2021-01-27 DIAGNOSIS — I10 Essential (primary) hypertension: Secondary | ICD-10-CM

## 2021-01-27 DIAGNOSIS — Z9989 Dependence on other enabling machines and devices: Secondary | ICD-10-CM

## 2021-01-27 DIAGNOSIS — Z7189 Other specified counseling: Secondary | ICD-10-CM

## 2021-01-27 NOTE — Patient Instructions (Signed)

## 2021-01-27 NOTE — Progress Notes (Signed)
[facility] [place], [place] [address]  Pulmonary Sleep Medicine   Office Visit Note  Patient Name: Carolyn Graham DOB: December 28, 1955 MRN [id number]    Chief Complaint: Obstructive Sleep Apnea visit  Brief History:  Carolyn Graham is seen today for annual follow up The patient has a 7 year history of sleep apnea. Patient is mostly using PAP nightly.  The patient feels better after sleeping with PAP.  The patient reports no problems from PAP use. Reported sleepiness is  improved and the Epworth Sleepiness Score is 14 out of 24. The patient does occasionally take naps. The patient complains of the following: mask leak  The compliance download shows  compliance with an average use time of 5:41 hours@84 %. The AHI is 2.6  The patient does complain of limb movements disrupting sleep but feels that they are well controlled with pramipexole.   ROS  General: (-) fever, (-) chills, (-) night sweat Nose and Sinuses: (-) nasal stuffiness or itchiness, (-) postnasal drip, (-) nosebleeds, (-) sinus trouble. Mouth and Throat: (-) sore throat, (-) hoarseness. Neck: (-) swollen glands, (-) enlarged thyroid, (-) neck pain. Respiratory: - cough, - shortness of breath, - wheezing. Neurologic: - numbness, - tingling. Psychiatric: - anxiety, - depression   Current Medication: Outpatient Encounter Medications as of 01/27/2021  Medication Sig   alendronate (FOSAMAX) 70 MG tablet TAKE 1 TABLET BY MOUTH EVERY 7 DAYS WITH A FULL GLASS OF WATER. DO NOT LIE DOWN FOR THE NEXT 30 MINUTES   bisoprolol-hydrochlorothiazide (ZIAC) 2.5-6.25 MG tablet Take 1 tablet by mouth daily.   Calcium Carbonate-Vitamin D (CALCIUM 600+D PO) Take 1 tablet daily by mouth.    Cholecalciferol (VITAMIN D) 2000 units CAPS Take 2,000 Units daily by mouth.    diclofenac Sodium (VOLTAREN) 1 % GEL Apply 2-4 grams to affected joint 4 times daily as needed.   DULoxetine (CYMBALTA) 30 MG capsule Take by mouth.   JANUVIA 100  MG tablet Take 100 mg by mouth daily.   MAGNESIUM ASPARTATE PO Take 600 mg by mouth daily.   metFORMIN (GLUCOPHAGE-XR) 750 MG 24 hr tablet TAKE 1 TABLET BY MOUTH DAILY FOR 4 TO 5 DAYS, THEN 1 TABLET TWICE DAILY IF NO SIDE EFFECTS   metFORMIN (GLUCOPHAGE-XR) 750 MG 24 hr tablet Take 1 tablet by mouth 2 (two) times daily. (Patient not taking: Reported on 11/26/2020)   omeprazole (PRILOSEC) 20 MG capsule Take 20 mg by mouth daily   omeprazole (PRILOSEC) 20 MG capsule Take by mouth. (Patient not taking: Reported on 11/26/2020)   Potassium Gluconate 550 MG TABS Take by mouth every other day.   pramipexole (MIRAPEX) 0.5 MG tablet Take 1.5 mg by mouth 90 minutes before bedtime   rosuvastatin (CRESTOR) 10 MG tablet Take 10 mg daily by mouth.   SUMAtriptan (IMITREX) 100 MG tablet Take 100 mg by mouth every 2 (two) hours as needed for migraine or headache.   tiZANidine (ZANAFLEX) 4 MG capsule TAKE 1 CAPSULE(4 MG) BY MOUTH AT BEDTIME AS NEEDED FOR MUSCLE SPASMS   TRADJENTA 5 MG TABS tablet Take 5 mg by mouth daily.   traMADol (ULTRAM) 50 MG tablet TAKE 1 TABLET BY MOUTH AT BEDTIME AS NEEDED   valACYclovir (VALTREX) 1000 MG tablet Take 1,000 mg by mouth daily as needed (for fever blisters).   vitamin B-12 (CYANOCOBALAMIN) 1000 MCG tablet Take 1,000 mcg by mouth daily.   zolpidem (AMBIEN) 5 MG tablet TAKE 1 TABLET(5 MG) BY MOUTH AT BEDTIME AS NEEDED FOR  SLEEP   Facility-Administered Encounter Medications as of 01/27/2021  Medication   triamcinolone acetonide (KENALOG) 10 MG/ML injection 10 mg    Surgical History: Past Surgical History:  Procedure Laterality Date   CARPAL TUNNEL RELEASE Left 12/18/2015   Procedure: CARPAL TUNNEL RELEASE;  Surgeon: Leanor Kail, MD;  Location: [facility] ORS;  Service: Orthopedics;  Laterality: Left;   CARPAL TUNNEL RELEASE Right 2018   CATARACT EXTRACTION W/PHACO Right 11/13/2015   Procedure: CATARACT EXTRACTION PHACO AND INTRAOCULAR LENS PLACEMENT ([place]);  Surgeon: Estill Cotta, MD;  Location: [facility] ORS;  Service: Ophthalmology;  Laterality: Right;  [place] 01:08AP% 21.6CDE 26.99Fluid pack lot # [id number]   CATARACT EXTRACTION W/PHACO Left 12/04/2015   Procedure: CATARACT EXTRACTION PHACO AND INTRAOCULAR LENS PLACEMENT (IOC);  Surgeon: Estill Cotta, MD;  Location: [facility] ORS;  Service: Ophthalmology;  Laterality: Left;  [place] 59.2 AP% 24.4 CDE 29.2 Fluid Pack lot # [id number]   CERVICAL DISC SURGERY     cervical decompression c4-5 and c5-6   COLONOSCOPY W/ BIOPSIES AND POLYPECTOMY     CTR     right hand   EYE SURGERY Bilateral    cataract    LUMBAR SPINE SURGERY  2018   SHOULDER SURGERY     right shoulder   TRIGGER FINGER RELEASE     right finger   WISDOM TOOTH EXTRACTION      Medical History: Past Medical History:  Diagnosis Date   Arthritis    Broken ankle 11/2013   right ankle   Cataract    Bil/ surgery on right eye   Diabetes mellitus without complication ([place])    Fatty liver    Fibromyalgia 2005   GERD (gastroesophageal reflux disease)    uses prilosec   Headache(784.0)    not as often as before   Hypercholesteremia    Hypertension    Hyperthyroidism    OSA (obstructive sleep apnea)    CPAP   Plantar fasciitis    Post-operative nausea and vomiting    Restless leg syndrome    Schatzki's ring    Spondylolisthesis of lumbar region    Stress incontinence    Stroke ([place]) 2015   TIA.  unable to determine if this is true.   Thyroiditis    Vertigo     Family History: Non contributory to the present illness  Social History: Social History   Socioeconomic History   Marital status: Married    Spouse name: Not on file   Number of children: 0   Years of education: Not on file   Highest education level: Not on file  Occupational History   Occupation: Civil engineer, contracting     Employer: [employer]  Tobacco Use   Smoking status: Never   Smokeless tobacco: Never  Vaping Use   Vaping Use: Never used  Substance and Sexual Activity    Alcohol use: Yes    Comment: OCCAS   Drug use: No   Sexual activity: Yes    Partners: Female  Other Topics Concern   Not on file  Social History Narrative   Married to [place], no natural children   Right handed   Bachelor's degree   2-3 cups daily         Social Determinants of Health   Financial Resource Strain: Not on file  Food Insecurity: Not on file  Transportation Needs: Not on file  Physical Activity: Not on file  Stress: Not on file  Social Connections: Not on file  Intimate Partner Violence: Not  on file    Vital Signs: There were no vitals taken for this visit. There is no height or weight on file to calculate BMI.    Examination: General Appearance: The patient is well-developed, well-nourished, and in no distress. Neck Circumference:  Skin: Gross inspection of skin unremarkable. Head: normocephalic, no gross deformities. Eyes: no gross deformities noted. ENT: ears appear grossly normal Neurologic: Alert and oriented. No involuntary movements.    EPWORTH SLEEPINESS SCALE:  Scale:  (0)= no chance of dozing; (1)= slight chance of dozing; (2)= moderate chance of dozing; (3)= high chance of dozing  Chance  Situtation    Sitting and reading: 3    Watching TV: 1    Sitting Inactive in public: 1    As a passenger in car: 3      Lying down to rest: 3    Sitting and talking: 0    Sitting quielty after lunch: 3    In a car, stopped in traffic: 0   TOTAL SCORE:   14 out of 24    SLEEP STUDIES:  Split 08/03/13-AHI  42.8,  Low SpO2 88%   CPAP COMPLIANCE DATA:  Date Range: 01/24/20 - 01/22/21  Average Daily Use: 5:41 hours  Median Use: 6 hours  Compliance for > 4 Hours: 84% days  AHI: 2.6 respiratory events per hour  Days Used: 352/365  Mask Leak: 2.6 lpm  95th Percentile Pressure: 19.1 cmH2O   LABS: No results found for this or any previous visit (from the past 2160 hour(s)).  Radiology: MM 3D SCREEN BREAST  BILATERAL  Result Date: 12/12/2020 CLINICAL DATA:  Screening. EXAM: DIGITAL SCREENING BILATERAL MAMMOGRAM WITH TOMOSYNTHESIS AND CAD TECHNIQUE: Bilateral screening digital craniocaudal and mediolateral oblique mammograms were obtained. Bilateral screening digital breast tomosynthesis was performed. The images were evaluated with computer-aided detection. COMPARISON:  Previous exam(s). ACR Breast Density Category b: There are scattered areas of fibroglandular density. FINDINGS: There are no findings suspicious for malignancy. IMPRESSION: No mammographic evidence of malignancy. A result letter of this screening mammogram will be mailed directly to the patient. RECOMMENDATION: Screening mammogram in one year. (Code:SM-B-01Y) BI-RADS CATEGORY  1: Negative. Electronically Signed   By: Valentino Saxon M.D.   On: 12/12/2020 12:40   No results found.  No results found.    Assessment and Plan: Patient Active Problem List   Diagnosis Date Noted   Primary osteoarthritis of both hands 08/30/2017   Primary osteoarthritis of both knees 08/30/2017   DDD (degenerative disc disease), cervical 08/30/2017   DDD (degenerative disc disease), lumbar 08/30/2017   History of insomnia 08/30/2017   Fatty liver 08/30/2017   History of TIA (transient ischemic attack) 08/30/2017   S/p bilateral carpal tunnel release 08/30/2017   History of gastroesophageal reflux (GERD) 08/30/2017   History of vitamin D deficiency 08/30/2017   Spondylolisthesis of lumbar region 01/12/2017   Benign essential hypertension 05/06/2016   Oral herpes 05/06/2016   Carpal tunnel syndrome of left wrist 11/08/2015   Hyperlipidemia, unspecified 05/10/2015   Osteopenia 11/05/2014   Closed fracture of fibula 01/09/2014   Sleep apnea 01/06/2011   Restless leg syndrome 01/06/2011   Insomnia 01/06/2011   Fibromyalgia 01/06/2011   GERD (gastroesophageal reflux disease) 12/15/2007    1. OSA on CPAP The patient does tolerate PAP and  reports  benefit from PAP use. The patient was reminded how to clean equipment and advised to replace supplies routinely. The patient was also counselled on weight loss. The compliance is good. The AHI  is 2.6.   OSA- continue with good compliance. Try to get a longer night of sleep.    2. CPAP use counseling CPAP Counseling: had a lengthy discussion with the patient regarding the importance of PAP therapy in management of the sleep apnea. Patient appears to understand the risk factor reduction and also understands the risks associated with untreated sleep apnea. Patient will try to make a good faith effort to remain compliant with therapy. Also instructed the patient on proper cleaning of the device including the water must be changed daily if possible and use of distilled water is preferred. Patient understands that the machine should be regularly cleaned with appropriate recommended cleaning solutions that do not damage the PAP machine for example given white vinegar and water rinses. Other methods such as ozone treatment may not be as good as these simple methods to achieve cleaning.   3. Obesity (BMI 30-39.9) Obesity Counseling: Had a lengthy discussion regarding patients BMI and weight issues. Patient was instructed on portion control as well as increased activity. Also discussed caloric restrictions with trying to maintain intake less than 2000 Kcal. Discussions were made in accordance with the 5As of weight management. Simple actions such as not eating late and if able to, taking a walk is suggested.   4. Benign essential hypertension Hypertension Counseling:   The following hypertensive lifestyle modification were recommended and discussed:  1. Limiting alcohol intake to less than 1 oz/day of ethanol:(24 oz of beer or 8 oz of wine or 2 oz of 100-proof whiskey). 2. Take baby ASA 81 mg daily. 3. Importance of regular aerobic exercise and losing weight. 4. Reduce dietary saturated fat and  cholesterol intake for overall cardiovascular health. 5. Maintaining adequate dietary potassium, calcium, and magnesium intake. 6. Regular monitoring of the blood pressure. 7. Reduce sodium intake to less than 100 mmol/day (less than 2.3 gm of sodium or less than 6 gm of sodium choride)     General Counseling: I have discussed the findings of the evaluation and examination with Katharine Look.  I have also discussed any further diagnostic evaluation thatmay be needed or ordered today. Valeska verbalizes understanding of the findings of todays visit. We also reviewed her medications today and discussed drug interactions and side effects including but not limited excessive drowsiness and altered mental states. We also discussed that there is always a risk not just to her but also people around her. she has been encouraged to call the office with any questions or concerns that should arise related to todays visit.  No orders of the defined types were placed in this encounter.       I have personally obtained a history, examined the patient, evaluated laboratory and imaging results, formulated the assessment and plan and placed orders. This patient was seen today by Tressie Ellis, PA-C in collaboration with Dr. Devona Konig.   Allyne Gee, MD [facility] Diplomate ABMS Pulmonary Critical Care Medicine and Sleep Medicine

## 2021-02-03 ENCOUNTER — Other Ambulatory Visit: Payer: Self-pay | Admitting: *Deleted

## 2021-02-03 MED ORDER — TRAMADOL HCL 50 MG PO TABS: 50.0000 mg | ORAL_TABLET | Freq: Every evening | ORAL | 0 refills | Status: DC | PRN

## 2021-02-03 MED ORDER — ZOLPIDEM TARTRATE 5 MG PO TABS: ORAL_TABLET | ORAL | 0 refills | Status: DC

## 2021-02-03 NOTE — Telephone Encounter (Signed)
Patient contacted the office requesting a refill on Ambien and Tramadol to [place], [place].   Per last office note on 11/26/2020: referred to pain management for future Tramadol prescriptions. Patient states she left for [place] on 01/05/2021. Patient states she won't be back in town until April 2023. Patient states that the soonest appointment the pain management had available was December when she would already be in [place]. Patient states it will be miserable in [place] for 4 months with out any pain medication. She is requesting a refill on the Tramadol.  Next Visit: 05/27/2021  Last Visit: 11/26/2020  UDS:06/11/2020  Narc Agreement: 06/11/2020  Last Fill: 01/06/2021 (Ambien), 08/01/2020 (Tramadol)  Okay to refill Ambien and Tramadol?

## 2021-03-05 ENCOUNTER — Other Ambulatory Visit: Payer: Self-pay | Admitting: Rheumatology

## 2021-03-05 MED ORDER — TRAMADOL HCL 50 MG PO TABS: 50.0000 mg | ORAL_TABLET | Freq: Every evening | ORAL | 0 refills | Status: DC | PRN

## 2021-03-05 MED ORDER — ZOLPIDEM TARTRATE 5 MG PO TABS: ORAL_TABLET | ORAL | 0 refills | Status: DC

## 2021-03-05 NOTE — Telephone Encounter (Signed)
Patient left a voicemail requesting refills of Tramadol 50mg  and Zolpidem 5mg  to be sent to [facility] in [place]. Patient states it is urgent, she is out of Tramadol.

## 2021-03-05 NOTE — Telephone Encounter (Signed)
Spoke with patient and she needs a refill sent to [facility].   Next Visit: 05/27/2021   Last Visit: 11/26/2020   UDS:06/11/2020   Narc Agreement: 06/11/2020   Last Fill: 02/03/2021 (Ambien), 02/03/2021(Tramadol)   Okay to refill Ambien and Tramadol?

## 2021-04-03 ENCOUNTER — Other Ambulatory Visit: Payer: Self-pay

## 2021-04-03 MED ORDER — TRAMADOL HCL 50 MG PO TABS: 50.0000 mg | ORAL_TABLET | Freq: Every evening | ORAL | 0 refills | Status: DC | PRN

## 2021-04-03 MED ORDER — ZOLPIDEM TARTRATE 5 MG PO TABS: ORAL_TABLET | ORAL | 0 refills | Status: DC

## 2021-04-03 NOTE — Telephone Encounter (Signed)
Next Visit: 05/27/2021   Last Visit: 11/26/2020   UDS:06/11/2020   Narc Agreement: 06/11/2020   Last Fill: 03/05/2021   Okay to refill Ambien and Tramadol?

## 2021-04-03 NOTE — Telephone Encounter (Signed)
Patient called requesting prescription refills of Zolpidem and Tramadol to be sent to [facility] at [address] in [place], [place].

## 2021-05-02 ENCOUNTER — Other Ambulatory Visit: Payer: Self-pay | Admitting: Rheumatology

## 2021-05-02 MED ORDER — ZOLPIDEM TARTRATE 5 MG PO TABS: ORAL_TABLET | ORAL | 0 refills | Status: DC

## 2021-05-02 MED ORDER — TRAMADOL HCL 50 MG PO TABS: 50.0000 mg | ORAL_TABLET | Freq: Every evening | ORAL | 0 refills | Status: DC | PRN

## 2021-05-02 NOTE — Telephone Encounter (Signed)
Next Visit: 06/26/2021 ? ?Last Visit: 11/26/2020 ? ?UDS:06/11/2020 ? ?Narc Agreement: 06/11/2020 ? ?Last Fill: 04/03/2021 (of both) ? ?Narcotic agreement states [facility] in [place] but patient is requesting prescriptions are sent to [facility] in [facility].  ? ?Okay to refill tramadol and ambien?  ?

## 2021-05-02 NOTE — Telephone Encounter (Signed)
Patient called the office requesting a refill of Ambien '5mg'$  and Tramadol '50mg'$  to be sent to [facility] in [place] on [address] ?

## 2021-05-27 ENCOUNTER — Ambulatory Visit: Payer: BC Managed Care – PPO | Admitting: Physician Assistant

## 2021-06-02 ENCOUNTER — Other Ambulatory Visit: Payer: Self-pay | Admitting: Rheumatology

## 2021-06-02 NOTE — Telephone Encounter (Signed)
Stacy from [facility] left a voicemail requesting a refill of Ambien '5mg'$  for the patient.  ?Pharmacy phone [phone] ?Fax [phone] ?

## 2021-06-02 NOTE — Telephone Encounter (Signed)
Next Visit: 06/25/2021 ? ?Last Visit: 11/26/2021 ? ?Last Fill: 05/02/2021 ? ?Dx: History of sleep apnea ? ?Current Dose per office note on 11/26/2020: Ambien 5 mg p.o. nightly ? ?Okay to refill Ambien?  ? ? ?

## 2021-06-03 ENCOUNTER — Other Ambulatory Visit: Payer: Self-pay

## 2021-06-03 MED ORDER — ZOLPIDEM TARTRATE 5 MG PO TABS: ORAL_TABLET | ORAL | 0 refills | Status: DC

## 2021-06-03 NOTE — Telephone Encounter (Signed)
Patient called stating she is in [place] and her prescription of Zolpidem was sent to the [facility] in [place].  Patient states she is leaving [place] tomorrow, 06/04/21 and needs the prescription to be sent to [facility] at [address] [place] [address]  Phone [phone] ?

## 2021-06-11 NOTE — Progress Notes (Deleted)
Office Visit Note  Patient: Carolyn Graham             Date of Birth: 1955-09-01           MRN: [id number]             PCP: Juluis Pitch, MD Referring: Juluis Pitch, MD Visit Date: 06/25/2021 Occupation: '@GUAROCC'$ @  Subjective:    History of Present Illness: Carolyn Graham is a 66 y.o. female with history of fibromyalgia and osteoarthritis.   Ambien  Tramadol   Activities of Daily Living:  Patient reports morning stiffness for *** {minute/hour:19697}.   Patient {ACTIONS;DENIES/REPORTS:21021675::"Denies"} nocturnal pain.  Difficulty dressing/grooming: {ACTIONS;DENIES/REPORTS:21021675::"Denies"} Difficulty climbing stairs: {ACTIONS;DENIES/REPORTS:21021675::"Denies"} Difficulty getting out of chair: {ACTIONS;DENIES/REPORTS:21021675::"Denies"} Difficulty using hands for taps, buttons, cutlery, and/or writing: {ACTIONS;DENIES/REPORTS:21021675::"Denies"}  No Rheumatology ROS completed.   PMFS History:  Patient Active Problem List   Diagnosis Date Noted   OSA on CPAP 01/27/2021   Diabetes mellitus without complication ([place]) 62/26/3335   Primary osteoarthritis of both hands 08/30/2017   Primary osteoarthritis of both knees 08/30/2017   DDD (degenerative disc disease), cervical 08/30/2017   DDD (degenerative disc disease), lumbar 08/30/2017   History of insomnia 08/30/2017   Fatty liver 08/30/2017   History of TIA (transient ischemic attack) 08/30/2017   S/p bilateral carpal tunnel release 08/30/2017   History of gastroesophageal reflux (GERD) 08/30/2017   History of vitamin D deficiency 08/30/2017   Spondylolisthesis of lumbar region 01/12/2017   Benign essential hypertension 05/06/2016   Oral herpes 05/06/2016   Carpal tunnel syndrome of left wrist 11/08/2015   Hyperlipidemia, unspecified 05/10/2015   Osteopenia 11/05/2014   Closed fracture of fibula 01/09/2014   Sleep apnea 01/06/2011   Restless leg syndrome 01/06/2011   Insomnia 01/06/2011   Fibromyalgia  01/06/2011   GERD (gastroesophageal reflux disease) 12/15/2007    Past Medical History:  Diagnosis Date   Arthritis    Broken ankle 11/2013   right ankle   Cataract    Bil/ surgery on right eye   Diabetes mellitus without complication ([place])    Fatty liver    Fibromyalgia 2005   GERD (gastroesophageal reflux disease)    uses prilosec   Headache(784.0)    not as often as before   Hypercholesteremia    Hypertension    Hyperthyroidism    OSA (obstructive sleep apnea)    CPAP   Plantar fasciitis    Post-operative nausea and vomiting    Restless leg syndrome    Schatzki's ring    Spondylolisthesis of lumbar region    Stress incontinence    Stroke ([place]) 2015   TIA.  unable to determine if this is true.   Thyroiditis    Vertigo     Family History  Problem Relation Age of Onset   Colon cancer Mother 11   Lung cancer Father 65   Colon cancer Maternal Grandfather    Breast cancer Neg Hx    Past Surgical History:  Procedure Laterality Date   CARPAL TUNNEL RELEASE Left 12/18/2015   Procedure: CARPAL TUNNEL RELEASE;  Surgeon: Leanor Kail, MD;  Location: [facility] ORS;  Service: Orthopedics;  Laterality: Left;   CARPAL TUNNEL RELEASE Right 2018   CATARACT EXTRACTION W/PHACO Right 11/13/2015   Procedure: CATARACT EXTRACTION PHACO AND INTRAOCULAR LENS PLACEMENT ([place]);  Surgeon: Estill Cotta, MD;  Location: [facility] ORS;  Service: Ophthalmology;  Laterality: Right;  [place] 01:08AP% 21.6CDE 26.99Fluid pack lot # [id number]   CATARACT EXTRACTION W/PHACO Left 12/04/2015   Procedure: CATARACT  EXTRACTION PHACO AND INTRAOCULAR LENS PLACEMENT (I[place]);  Surgeon: Estill Cotta, MD;  Location: [facility] ORS;  Service: Ophthalmology;  Laterality: Left;  [place] 59.2 AP% 24.4 CDE 29.2 Fluid Pack lot # [id number]   CERVICAL DISC SURGERY     cervical decompression c4-5 and c5-6   COLONOSCOPY W/ BIOPSIES AND POLYPECTOMY     CTR     right hand   EYE SURGERY Bilateral    cataract    LUMBAR SPINE SURGERY  2018    SHOULDER SURGERY     right shoulder   TRIGGER FINGER RELEASE     right finger   WISDOM TOOTH EXTRACTION     Social History   Social History Narrative   Married to [place], no natural children   Right handed   Bachelor's degree   2-3 cups daily         Immunization History  Administered Date(s) Administered   Influenza Inj Mdck Quad Pf 12/11/2016   Influenza Inj Mdck Quad With Preservative 12/13/2017   Influenza,inj,Quad PF,6+ Mos 10/29/2018   Influenza-Unspecified 02/15/2015, 01/01/2016, 12/02/2016   PFIZER(Purple Top)SARS-COV-2 Vaccination 05/11/2019, 06/06/2019, 12/11/2019   Tdap 11/14/2007, 04/14/2017     Objective: Vital Signs: There were no vitals taken for this visit.   Physical Exam Vitals and nursing note reviewed.  Constitutional:      Appearance: She is well-developed.  HENT:     Head: Normocephalic and atraumatic.  Eyes:     Conjunctiva/sclera: Conjunctivae normal.  Cardiovascular:     Rate and Rhythm: Normal rate and regular rhythm.     Heart sounds: Normal heart sounds.  Pulmonary:     Effort: Pulmonary effort is normal.     Breath sounds: Normal breath sounds.  Abdominal:     General: Bowel sounds are normal.     Palpations: Abdomen is soft.  Musculoskeletal:     Cervical back: Normal range of motion.  Skin:    General: Skin is warm and dry.     Capillary Refill: Capillary refill takes less than 2 seconds.  Neurological:     Mental Status: She is alert and oriented to person, place, and time.  Psychiatric:        Behavior: Behavior normal.     Musculoskeletal Exam: ***  CDAI Exam: CDAI Score: -- Patient Global: --; Provider Global: -- Swollen: --; Tender: -- Joint Exam 06/25/2021   No joint exam has been documented for this visit   There is currently no information documented on the homunculus. Go to the Rheumatology activity and complete the homunculus joint exam.  Investigation: No additional findings.  Imaging: No results  found.  Recent Labs: Lab Results  Component Value Date   WBC 6.8 08/04/2017   HGB 12.5 08/04/2017   PLT 257 08/04/2017   NA 139 08/04/2017   K 4.2 08/04/2017   CL 104 08/04/2017   CO2 29 08/04/2017   GLUCOSE 111 (H) 08/04/2017   BUN 16 08/04/2017   CREATININE 0.83 08/04/2017   BILITOT 0.3 08/04/2017   ALKPHOS 70 01/07/2017   AST 58 (H) 08/04/2017   ALT 75 (H) 08/04/2017   PROT 7.1 08/04/2017   ALBUMIN 4.0 01/07/2017   CALCIUM 9.4 08/04/2017   GFRAA 89 08/04/2017    Speciality Comments: No specialty comments available.  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   DDD (degenerative disc disease), cervical  DDD (degenerative disc disease), lumbar - L3-4, L4-5 fusion January 22, 2017 by Dr. Ellene Route.   Medication monitoring encounter - patient was referred to pain management on 11/08/2020.  tramadol. UDS & narcotic agreement: 06/11/2020.   History of vitamin D deficiency  History of hypertension  History of TIA (transient ischemic attack)  History of diabetes mellitus, type II  History of migraine  Fatty liver  History of gastroesophageal reflux (GERD)  RLS (restless legs syndrome)  History of sleep apnea - she is on Ambien 5 mg p.o. nightly.   Orders: No orders of the defined types were placed in this encounter.  No orders of the defined types were placed in this encounter.    Follow-Up Instructions: No follow-ups on file.   Ofilia Neas, PA-C  Note - This record has been created using Dragon software.  Chart creation errors have been sought, but may not always  have been located. Such creation errors do not reflect on  the standard of medical care.

## 2021-06-25 ENCOUNTER — Ambulatory Visit: Payer: BC Managed Care – PPO | Admitting: Physician Assistant

## 2021-06-25 DIAGNOSIS — G2581 Restless legs syndrome: Secondary | ICD-10-CM

## 2021-06-25 DIAGNOSIS — M5136 Other intervertebral disc degeneration, lumbar region: Secondary | ICD-10-CM

## 2021-06-25 DIAGNOSIS — M170 Bilateral primary osteoarthritis of knee: Secondary | ICD-10-CM

## 2021-06-25 DIAGNOSIS — Z8673 Personal history of transient ischemic attack (TIA), and cerebral infarction without residual deficits: Secondary | ICD-10-CM

## 2021-06-25 DIAGNOSIS — K760 Fatty (change of) liver, not elsewhere classified: Secondary | ICD-10-CM

## 2021-06-25 DIAGNOSIS — Z8679 Personal history of other diseases of the circulatory system: Secondary | ICD-10-CM

## 2021-06-25 DIAGNOSIS — Z8669 Personal history of other diseases of the nervous system and sense organs: Secondary | ICD-10-CM

## 2021-06-25 DIAGNOSIS — M5030 Other cervical disc degeneration, unspecified cervical region: Secondary | ICD-10-CM

## 2021-06-25 DIAGNOSIS — M19041 Primary osteoarthritis, right hand: Secondary | ICD-10-CM

## 2021-06-25 DIAGNOSIS — Z5181 Encounter for therapeutic drug level monitoring: Secondary | ICD-10-CM

## 2021-06-25 DIAGNOSIS — Z8719 Personal history of other diseases of the digestive system: Secondary | ICD-10-CM

## 2021-06-25 DIAGNOSIS — M797 Fibromyalgia: Secondary | ICD-10-CM

## 2021-06-25 DIAGNOSIS — Z8639 Personal history of other endocrine, nutritional and metabolic disease: Secondary | ICD-10-CM

## 2021-06-25 NOTE — Progress Notes (Signed)
? ?Office Visit Note ? ?Patient: Carolyn Graham             ?Date of Birth: Jan 18, 1956           ?MRN: [id number]             ?PCP: Juluis Pitch, MD ?Referring: Juluis Pitch, MD ?Visit Date: 07/03/2021 ?Occupation: '@GUAROCC'$ @ ? ?Subjective:  ?Left knee pain ? ?History of Present Illness: Carolyn Graham is a 66 y.o. female with history of fibromyalgia, osteoarthritis, and DDD.  Patient continues to experience intermittent myalgias and muscle tenderness due to fibromyalgia.  She is having some trapezius muscle tension and tenderness bilaterally and has had some increased discomfort in her her lower back recently.  She continues to have chronic pain in the left knee joint.  Her symptoms are exacerbated by climbing steps.  She has been taking tramadol and Motrin for pain relief and uses Voltaren gel topically as needed for breakthrough symptoms.  She remains on Ambien 5 mg at bedtime for insomnia.  She requested refills of tramadol and Ambien to be sent to the pharmacy today.  She also requested to have a left knee joint cortisone injection. ? ? ? ?Activities of Daily Living:  ?Patient reports morning stiffness for all day. ?Patient Reports nocturnal pain.  ?Difficulty dressing/grooming: Denies ?Difficulty climbing stairs: Reports ?Difficulty getting out of chair: Reports ?Difficulty using hands for taps, buttons, cutlery, and/or writing: Denies ? ?Review of Systems  ?Constitutional:  Positive for fatigue.  ?HENT:  Positive for mouth dryness and nose dryness. Negative for mouth sores.   ?Eyes:  Positive for dryness. Negative for pain and itching.  ?Respiratory:  Negative for shortness of breath and difficulty breathing.   ?Cardiovascular:  Negative for chest pain and palpitations.  ?Gastrointestinal:  Negative for blood in stool, constipation and diarrhea.  ?Endocrine: Negative for increased urination.  ?Genitourinary:  Negative for difficulty urinating.  ?Musculoskeletal:  Positive for joint pain, joint pain, joint  swelling, myalgias, morning stiffness, muscle tenderness and myalgias.  ?Skin:  Negative for color change, rash and redness.  ?Allergic/Immunologic: Negative for susceptible to infections.  ?Neurological:  Positive for dizziness, numbness, headaches and weakness. Negative for memory loss.  ?Hematological:  Positive for bruising/bleeding tendency.  ?Psychiatric/Behavioral:  Negative for confusion.   ? ?PMFS History:  ?Patient Active Problem List  ? Diagnosis Date Noted  ? OSA on CPAP 01/27/2021  ? Diabetes mellitus without complication ([place]) 73/22/0254  ? Primary osteoarthritis of both hands 08/30/2017  ? Primary osteoarthritis of both knees 08/30/2017  ? DDD (degenerative disc disease), cervical 08/30/2017  ? DDD (degenerative disc disease), lumbar 08/30/2017  ? History of insomnia 08/30/2017  ? Fatty liver 08/30/2017  ? History of TIA (transient ischemic attack) 08/30/2017  ? S/p bilateral carpal tunnel release 08/30/2017  ? History of gastroesophageal reflux (GERD) 08/30/2017  ? History of vitamin D deficiency 08/30/2017  ? Spondylolisthesis of lumbar region 01/12/2017  ? Benign essential hypertension 05/06/2016  ? Oral herpes 05/06/2016  ? Carpal tunnel syndrome of left wrist 11/08/2015  ? Hyperlipidemia, unspecified 05/10/2015  ? Osteopenia 11/05/2014  ? Closed fracture of fibula 01/09/2014  ? Sleep apnea 01/06/2011  ? Restless leg syndrome 01/06/2011  ? Insomnia 01/06/2011  ? Fibromyalgia 01/06/2011  ? GERD (gastroesophageal reflux disease) 12/15/2007  ?  ?Past Medical History:  ?Diagnosis Date  ? Arthritis   ? Broken ankle 11/2013  ? right ankle  ? Cataract   ? Bil/ surgery on right eye  ? Diabetes  mellitus without complication ([place])   ? Fatty liver   ? Fibromyalgia 2005  ? GERD (gastroesophageal reflux disease)   ? uses prilosec  ? Headache(784.0)   ? not as often as before  ? Hypercholesteremia   ? Hypertension   ? Hyperthyroidism   ? OSA (obstructive sleep apnea)   ? CPAP  ? Plantar fasciitis   ?  Post-operative nausea and vomiting   ? Restless leg syndrome   ? Schatzki's ring   ? Spondylolisthesis of lumbar region   ? Stress incontinence   ? Stroke [facility]) 2015  ? TIA.  unable to determine if this is true.  ? Thyroiditis   ? Vertigo   ?  ?Family History  ?Problem Relation Age of Onset  ? Colon cancer Mother 23  ? Lung cancer Father 33  ? Colon cancer Maternal Grandfather   ? Breast cancer Neg Hx   ? ?Past Surgical History:  ?Procedure Laterality Date  ? CARPAL TUNNEL RELEASE Left 12/18/2015  ? Procedure: CARPAL TUNNEL RELEASE;  Surgeon: Leanor Kail, MD;  Location: [facility] ORS;  Service: Orthopedics;  Laterality: Left;  ? CARPAL TUNNEL RELEASE Right 2018  ? CATARACT EXTRACTION W/PHACO Right 11/13/2015  ? Procedure: CATARACT EXTRACTION PHACO AND INTRAOCULAR LENS PLACEMENT (IOC);  Surgeon: Estill Cotta, MD;  Location: [facility] ORS;  Service: Ophthalmology;  Laterality: Right;  [place] 01:08AP% 21.6CDE 26.99Fluid pack lot # [id number]  ? CATARACT EXTRACTION W/PHACO Left 12/04/2015  ? Procedure: CATARACT EXTRACTION PHACO AND INTRAOCULAR LENS PLACEMENT (IOC);  Surgeon: Estill Cotta, MD;  Location: [facility] ORS;  Service: Ophthalmology;  Laterality: Left;  [place] 59.2 ?AP% 24.4 ?CDE 29.2 ?Fluid Pack lot # [id number]  ? CERVICAL DISC SURGERY    ? cervical decompression c4-5 and c5-6  ? COLONOSCOPY W/ BIOPSIES AND POLYPECTOMY    ? CTR    ? right hand  ? EYE SURGERY Bilateral   ? cataract   ? LUMBAR SPINE SURGERY  2018  ? SHOULDER SURGERY    ? right shoulder  ? TRIGGER FINGER RELEASE    ? right finger  ? WISDOM TOOTH EXTRACTION    ? ?Social History  ? ?Social History Narrative  ? Married to [place], no natural children  ? Right handed  ? Bachelor's degree  ? 2-3 cups daily  ?   ?   ? ?Immunization History  ?Administered Date(s) Administered  ? Influenza Inj Mdck Quad Pf 12/11/2016  ? Influenza Inj Mdck Quad With Preservative 12/13/2017  ? Influenza,inj,Quad PF,6+ Mos 10/29/2018  ? Influenza-Unspecified 02/15/2015, 01/01/2016,  12/02/2016  ? PFIZER(Purple Top)SARS-COV-2 Vaccination 05/11/2019, 06/06/2019, 12/11/2019  ? Tdap 11/14/2007, 04/14/2017  ?  ? ?Objective: ?Vital Signs: BP 133/84 (BP Location: Left Arm, Patient Position: Sitting, Cuff Size: Normal)   Pulse 83   Ht 5' 1.25" (1.556 m)   Wt 216 lb 6.4 oz (98.2 kg)   BMI 40.56 kg/m?   ? ?Physical Exam ?Vitals and nursing note reviewed.  ?Constitutional:   ?   Appearance: She is well-developed.  ?HENT:  ?   Head: Normocephalic and atraumatic.  ?Eyes:  ?   Conjunctiva/sclera: Conjunctivae normal.  ?Cardiovascular:  ?   Rate and Rhythm: Normal rate and regular rhythm.  ?   Heart sounds: Normal heart sounds.  ?Pulmonary:  ?   Effort: Pulmonary effort is normal.  ?   Breath sounds: Normal breath sounds.  ?Abdominal:  ?   General: Bowel sounds are normal.  ?   Palpations: Abdomen is soft.  ?Musculoskeletal:  ?  Cervical back: Normal range of motion.  ?Skin: ?   General: Skin is warm and dry.  ?   Capillary Refill: Capillary refill takes less than 2 seconds.  ?Neurological:  ?   Mental Status: She is alert and oriented to person, place, and time.  ?Psychiatric:     ?   Behavior: Behavior normal.  ?  ? ?Musculoskeletal Exam: C-spine has slightly limited range of motion without rotation.  Some trapezius muscle tension and tenderness bilaterally.  Painful range of motion of both shoulder joints with tenderness to palpation bilaterally.  Elbow joints, wrist joints, MCPs, PIPs, DIPs have good range of motion with no synovitis.  PIP and DIP thickening consistent with osteoarthritis of both hands.  Tenderness over the right third PIP joint.  Hip joints have good range of motion with no groin pain.  Tenderness palpation over bilateral trochanteric bursa.  Knee joints have good range of motion with discomfort in the left knee.  No warmth or effusion of knee joints noted.  Ankle joints have good range of motion with no tenderness or joint swelling. ? ?CDAI Exam: ?CDAI Score: -- ?Patient Global:  --; Provider Global: -- ?Swollen: --; Tender: -- ?Joint Exam 07/03/2021  ? ?No joint exam has been documented for this visit  ? ?There is currently no information documented on the homunculus. Go to the Rheumatology act

## 2021-06-26 ENCOUNTER — Ambulatory Visit: Payer: BC Managed Care – PPO | Admitting: Physician Assistant

## 2021-07-03 ENCOUNTER — Ambulatory Visit: Payer: BC Managed Care – PPO | Admitting: Physician Assistant

## 2021-07-03 ENCOUNTER — Encounter: Payer: Self-pay | Admitting: Physician Assistant

## 2021-07-03 ENCOUNTER — Ambulatory Visit (INDEPENDENT_AMBULATORY_CARE_PROVIDER_SITE_OTHER): Payer: BC Managed Care – PPO

## 2021-07-03 VITALS — BP 133/84 | HR 83 | Ht 61.25 in | Wt 216.4 lb

## 2021-07-03 DIAGNOSIS — Z8719 Personal history of other diseases of the digestive system: Secondary | ICD-10-CM

## 2021-07-03 DIAGNOSIS — Z8679 Personal history of other diseases of the circulatory system: Secondary | ICD-10-CM

## 2021-07-03 DIAGNOSIS — Z8669 Personal history of other diseases of the nervous system and sense organs: Secondary | ICD-10-CM

## 2021-07-03 DIAGNOSIS — M797 Fibromyalgia: Secondary | ICD-10-CM

## 2021-07-03 DIAGNOSIS — G2581 Restless legs syndrome: Secondary | ICD-10-CM

## 2021-07-03 DIAGNOSIS — M25562 Pain in left knee: Secondary | ICD-10-CM

## 2021-07-03 DIAGNOSIS — M19041 Primary osteoarthritis, right hand: Secondary | ICD-10-CM

## 2021-07-03 DIAGNOSIS — Z5181 Encounter for therapeutic drug level monitoring: Secondary | ICD-10-CM

## 2021-07-03 DIAGNOSIS — M5030 Other cervical disc degeneration, unspecified cervical region: Secondary | ICD-10-CM

## 2021-07-03 DIAGNOSIS — M5136 Other intervertebral disc degeneration, lumbar region: Secondary | ICD-10-CM

## 2021-07-03 DIAGNOSIS — G8929 Other chronic pain: Secondary | ICD-10-CM | POA: Diagnosis not present

## 2021-07-03 DIAGNOSIS — M170 Bilateral primary osteoarthritis of knee: Secondary | ICD-10-CM

## 2021-07-03 DIAGNOSIS — M51369 Other intervertebral disc degeneration, lumbar region without mention of lumbar back pain or lower extremity pain: Secondary | ICD-10-CM

## 2021-07-03 DIAGNOSIS — M19042 Primary osteoarthritis, left hand: Secondary | ICD-10-CM

## 2021-07-03 DIAGNOSIS — Z8639 Personal history of other endocrine, nutritional and metabolic disease: Secondary | ICD-10-CM

## 2021-07-03 DIAGNOSIS — M8589 Other specified disorders of bone density and structure, multiple sites: Secondary | ICD-10-CM

## 2021-07-03 DIAGNOSIS — K760 Fatty (change of) liver, not elsewhere classified: Secondary | ICD-10-CM

## 2021-07-03 DIAGNOSIS — Z8673 Personal history of transient ischemic attack (TIA), and cerebral infarction without residual deficits: Secondary | ICD-10-CM

## 2021-07-03 MED ORDER — TRIAMCINOLONE ACETONIDE 40 MG/ML IJ SUSP
40.0000 mg | INTRAMUSCULAR | Status: AC | PRN
  Administered 2021-07-03: 40 mg via INTRA_ARTICULAR

## 2021-07-03 MED ORDER — LIDOCAINE HCL 1 % IJ SOLN
1.5000 mL | INTRAMUSCULAR | Status: AC | PRN
  Administered 2021-07-03: 1.5 mL

## 2021-07-03 MED ORDER — ZOLPIDEM TARTRATE 5 MG PO TABS: ORAL_TABLET | ORAL | 0 refills | Status: DC

## 2021-07-03 MED ORDER — TRAMADOL HCL 50 MG PO TABS: 50.0000 mg | ORAL_TABLET | Freq: Every evening | ORAL | 0 refills | Status: DC | PRN

## 2021-07-03 NOTE — Patient Instructions (Signed)
Knee Exercises ?Ask your health care provider which exercises are safe for you. Do exercises exactly as told by your health care provider and adjust them as directed. It is normal to feel mild stretching, pulling, tightness, or discomfort as you do these exercises. Stop right away if you feel sudden pain or your pain gets worse. Do not begin these exercises until told by your health care provider. ?Stretching and range-of-motion exercises ?These exercises warm up your muscles and joints and improve the movement and flexibility of your knee. These exercises also help to relieve pain and swelling. ?Knee extension, prone ? ?Lie on your abdomen (prone position) on a bed. ?Place your left / right knee just beyond the edge of the surface so your knee is not on the bed. You can put a towel under your left / right thigh just above your kneecap for comfort. ?Relax your leg muscles and allow gravity to straighten your knee (extension). You should feel a stretch behind your left / right knee. ?Hold this position for __________ seconds. ?Scoot up so your knee is supported between repetitions. ?Repeat __________ times. Complete this exercise __________ times a day. ?Knee flexion, active ? ?Lie on your back with both legs straight. If this causes back discomfort, bend your left / right knee so your foot is flat on the floor. ?Slowly slide your left / right heel back toward your buttocks. Stop when you feel a gentle stretch in the front of your knee or thigh (flexion). ?Hold this position for __________ seconds. ?Slowly slide your left / right heel back to the starting position. ?Repeat __________ times. Complete this exercise __________ times a day. ?Quadriceps stretch, prone ? ?Lie on your abdomen on a firm surface, such as a bed or padded floor. ?Bend your left / right knee and hold your ankle. If you cannot reach your ankle or pant leg, loop a belt around your foot and grab the belt instead. ?Gently pull your heel toward your  buttocks. Your knee should not slide out to the side. You should feel a stretch in the front of your thigh and knee (quadriceps). ?Hold this position for __________ seconds. ?Repeat __________ times. Complete this exercise __________ times a day. ?Hamstring, supine ? ?Lie on your back (supine position). ?Loop a belt or towel over the ball of your left / right foot. The ball of your foot is on the walking surface, right under your toes. ?Straighten your left / right knee and slowly pull on the belt to raise your leg until you feel a gentle stretch behind your knee (hamstring). ?Do not let your knee bend while you do this. ?Keep your other leg flat on the floor. ?Hold this position for __________ seconds. ?Repeat __________ times. Complete this exercise __________ times a day. ?Strengthening exercises ?These exercises build strength and endurance in your knee. Endurance is the ability to use your muscles for a long time, even after they get tired. ?Quadriceps, isometric ?This exercise strengthens the muscles in front of your thigh (quadriceps) without moving your knee joint (isometric). ?Lie on your back with your left / right leg extended and your other knee bent. Put a rolled towel or small pillow under your knee if told by your health care provider. ?Slowly tense the muscles in the front of your left / right thigh. You should see your kneecap slide up toward your hip or see increased dimpling just above the knee. This motion will push the back of the knee toward the floor. ?  For __________ seconds, hold the muscle as tight as you can without increasing your pain. ?Relax the muscles slowly and completely. ?Repeat __________ times. Complete this exercise __________ times a day. ?Straight leg raises ?This exercise strengthens the muscles in front of your thigh (quadriceps) and the muscles that move your hips (hip flexors). ?Lie on your back with your left / right leg extended and your other knee bent. ?Tense the  muscles in the front of your left / right thigh. You should see your kneecap slide up or see increased dimpling just above the knee. Your thigh may even shake a bit. ?Keep these muscles tight as you raise your leg 4-6 inches (10-15 cm) off the floor. Do not let your knee bend. ?Hold this position for __________ seconds. ?Keep these muscles tense as you lower your leg. ?Relax your muscles slowly and completely after each repetition. ?Repeat __________ times. Complete this exercise __________ times a day. ?Hamstring, isometric ? ?Lie on your back on a firm surface. ?Bend your left / right knee about __________ degrees. ?Dig your left / right heel into the surface as if you are trying to pull it toward your buttocks. Tighten the muscles in the back of your thighs (hamstring) to "dig" as hard as you can without increasing any pain. ?Hold this position for __________ seconds. ?Release the tension gradually and allow your muscles to relax completely for __________ seconds after each repetition. ?Repeat __________ times. Complete this exercise __________ times a day. ?Hamstring curls ?If told by your health care provider, do this exercise while wearing ankle weights. Begin with __________lb / kg weights. Then increase the weight by 1 lb (0.5 kg) increments. Do not wear ankle weights that are more than __________lb / kg. ?Lie on your abdomen with your legs straight. ?Bend your left / right knee as far as you can without feeling pain. Keep your hips flat against the floor. ?Hold this position for __________ seconds. ?Slowly lower your leg to the starting position. ?Repeat __________ times. Complete this exercise __________ times a day. ?Squats ?This exercise strengthens the muscles in front of your thigh and knee (quadriceps). ?Stand in front of a table, with your feet and knees pointing straight ahead. You may rest your hands on the table for balance but not for support. ?Slowly bend your knees and lower your hips like you  are going to sit in a chair. ?Keep your weight over your heels, not over your toes. ?Keep your lower legs upright so they are parallel with the table legs. ?Do not let your hips go lower than your knees. ?Do not bend lower than told by your health care provider. ?If your knee pain increases, do not bend as low. ?Hold the squat position for __________ seconds. ?Slowly push with your legs to return to standing. Do not use your hands to pull yourself to standing. ?Repeat __________ times. Complete this exercise __________ times a day. ?Wall slides ?This exercise strengthens the muscles in front of your thigh and knee (quadriceps). ?Lean your back against a smooth wall or door, and walk your feet out 18-24 inches (46-61 cm) from it. ?Place your feet hip-width apart. ?Slowly slide down the wall or door until your knees bend __________ degrees. Keep your knees over your heels, not over your toes. Keep your knees in line with your hips. ?Hold this position for __________ seconds. ?Repeat __________ times. Complete this exercise __________ times a day. ?Straight leg raises, side-lying ?This exercise strengthens the muscles that rotate   the leg at the hip and move it away from your body (hip abductors). ?Lie on your side with your left / right leg in the top position. Lie so your head, shoulder, knee, and hip line up. You may bend your bottom knee to help you keep your balance. ?Roll your hips slightly forward so your hips are stacked directly over each other and your left / right knee is facing forward. ?Leading with your heel, lift your top leg 4-6 inches (10-15 cm). You should feel the muscles in your outer hip lifting. ?Do not let your foot drift forward. ?Do not let your knee roll toward the ceiling. ?Hold this position for __________ seconds. ?Slowly return your leg to the starting position. ?Let your muscles relax completely after each repetition. ?Repeat __________ times. Complete this exercise __________ times a  day. ?Straight leg raises, prone ?This exercise stretches the muscles that move your hips away from the front of the pelvis (hip extensors). ?Lie on your abdomen on a firm surface. You can put a pillow under yo

## 2021-07-05 LAB — DRUG MONITOR, PANEL 5, W/CONF, URINE
Amphetamines: NEGATIVE ng/mL (ref <500)
Barbiturates: NEGATIVE ng/mL (ref <300)
Benzodiazepines: NEGATIVE ng/mL (ref <100)
Cocaine Metabolite: NEGATIVE ng/mL (ref <150)
Creatinine: 49.3 mg/dL (ref >=20.0)
Marijuana Metabolite: NEGATIVE ng/mL (ref <20)
Methadone Metabolite: NEGATIVE ng/mL (ref <100)
Opiates: NEGATIVE ng/mL (ref <100)
Oxidant: NEGATIVE ug/mL (ref <200)
Oxycodone: NEGATIVE ng/mL (ref <100)
pH: 5.7 (ref 4.5–9.0)

## 2021-07-05 LAB — DRUG MONITOR, TRAMADOL,QN, URINE
Desmethyltramadol: 197 ng/mL — ABNORMAL HIGH (ref <100)
Tramadol: 693 ng/mL — ABNORMAL HIGH (ref <100)

## 2021-07-05 LAB — DM TEMPLATE

## 2021-08-04 ENCOUNTER — Other Ambulatory Visit: Payer: Self-pay | Admitting: Rheumatology

## 2021-08-04 MED ORDER — TRAMADOL HCL 50 MG PO TABS: 50.0000 mg | ORAL_TABLET | Freq: Every evening | ORAL | 0 refills | Status: DC | PRN

## 2021-08-04 MED ORDER — ZOLPIDEM TARTRATE 5 MG PO TABS: ORAL_TABLET | ORAL | 0 refills | Status: DC

## 2021-08-04 NOTE — Telephone Encounter (Signed)
Next Visit: 12/25/2021  Last Visit: 07/03/2021  UDS:07/03/2021, UDS is consistent with treatment  Narc Agreement: 07/03/2021  Last Fill: 07/03/2021 tramadol 50 mg 1 tablet at bedtime as needed for pain relief and Ambien 5 mg at bedtime for insomnia.   Okay to refill Tramadol and Ambien?

## 2021-08-04 NOTE — Telephone Encounter (Signed)
Patient called the office requesting a refill of Tramadol '50mg'$  and Ambien '5mg'$  be sent to [facility] in [place].

## 2021-09-03 ENCOUNTER — Other Ambulatory Visit: Payer: Self-pay | Admitting: Rheumatology

## 2021-09-03 MED ORDER — TRAMADOL HCL 50 MG PO TABS: 50.0000 mg | ORAL_TABLET | Freq: Every evening | ORAL | 0 refills | Status: DC | PRN

## 2021-09-03 MED ORDER — ZOLPIDEM TARTRATE 5 MG PO TABS: ORAL_TABLET | ORAL | 0 refills | Status: DC

## 2021-09-03 NOTE — Telephone Encounter (Signed)
Patient called requesting prescription refills for Zolpidem and Tramadol to be sent to [facility] at [facility] in [place].

## 2021-09-03 NOTE — Telephone Encounter (Signed)
Next Visit: 12/25/2021   Last Visit: 07/03/2021   UDS:07/03/2021, UDS is consistent with treatment   Narc Agreement: 07/03/2021   Last Fill: 08/04/2021 tramadol 50 mg 1 tablet at bedtime as needed for pain relief and Ambien 5 mg at bedtime for insomnia.    Okay to refill Tramadol and Ambien?

## 2021-09-17 ENCOUNTER — Other Ambulatory Visit: Payer: Self-pay | Admitting: Physician Assistant

## 2021-10-02 ENCOUNTER — Other Ambulatory Visit: Payer: Self-pay | Admitting: Rheumatology

## 2021-10-02 MED ORDER — TRAMADOL HCL 50 MG PO TABS: 50.0000 mg | ORAL_TABLET | Freq: Every evening | ORAL | 0 refills | Status: DC | PRN

## 2021-10-02 MED ORDER — ZOLPIDEM TARTRATE 5 MG PO TABS: ORAL_TABLET | ORAL | 0 refills | Status: DC

## 2021-10-02 NOTE — Telephone Encounter (Signed)
Patient called requesting prescription refills for Tramadol and Zolpidem to be sent to [facility] at [facility] in [place].

## 2021-10-02 NOTE — Telephone Encounter (Signed)
Next Visit: 12/25/2021   Last Visit: 07/03/2021   UDS:07/03/2021, UDS is consistent with treatment   Narc Agreement: 07/03/2021   Last Fill: 09/03/2021 tramadol 50 mg 1 tablet at bedtime as needed for pain relief and Ambien 5 mg at bedtime for insomnia.    Okay to refill Tramadol and Ambien?

## 2021-10-23 ENCOUNTER — Other Ambulatory Visit: Payer: Self-pay | Admitting: Rheumatology

## 2021-10-29 ENCOUNTER — Other Ambulatory Visit: Payer: Self-pay | Admitting: Rheumatology

## 2021-10-29 MED ORDER — ZOLPIDEM TARTRATE 5 MG PO TABS: ORAL_TABLET | ORAL | 0 refills | Status: DC

## 2021-10-29 MED ORDER — TRAMADOL HCL 50 MG PO TABS: 50.0000 mg | ORAL_TABLET | Freq: Every evening | ORAL | 0 refills | Status: DC | PRN

## 2021-10-29 NOTE — Telephone Encounter (Signed)
Next Visit: 12/25/2021   Last Visit: 07/03/2021   UDS:07/03/2021, UDS is consistent with treatment   Narc Agreement: 07/03/2021   Last Fill: 10/02/2021 tramadol 50 mg 1 tablet at bedtime as needed for pain relief and Ambien 5 mg at bedtime for insomnia.    Okay to refill Tramadol and Ambien?

## 2021-11-03 ENCOUNTER — Encounter: Payer: Self-pay | Admitting: Internal Medicine

## 2021-12-01 ENCOUNTER — Other Ambulatory Visit: Payer: Self-pay | Admitting: Rheumatology

## 2021-12-01 MED ORDER — ZOLPIDEM TARTRATE 5 MG PO TABS: ORAL_TABLET | ORAL | 0 refills | Status: DC

## 2021-12-01 MED ORDER — TRAMADOL HCL 50 MG PO TABS: 50.0000 mg | ORAL_TABLET | Freq: Every evening | ORAL | 0 refills | Status: DC | PRN

## 2021-12-01 NOTE — Telephone Encounter (Signed)
Next Visit: 12/25/2021   Last Visit: 07/03/2021   UDS:07/03/2021, UDS is consistent with treatment   Narc Agreement: 07/03/2021   Last Fill: 10/29/2021 tramadol 50 mg 1 tablet at bedtime as needed for pain relief and Ambien 5 mg at bedtime for insomnia.    Okay to refill Tramadol and Ambien?

## 2021-12-11 NOTE — Progress Notes (Signed)
Office Visit Note  Patient: Carolyn Graham             Date of Birth: May 31, 1955           MRN: [id number]             PCP: Juluis Pitch, MD Referring: Juluis Pitch, MD Visit Date: 12/25/2021 Occupation: '@GUAROCC'$ @  Subjective:  Left shoulder pain  History of Present Illness: Carolyn Graham is a 66 y.o. female with history of osteoarthritis, degenerative disc disease, fibromyalgia and osteopenia.  She states she fell about 2 weeks ago as she missed the last step on her staircase.  She landed on her left side and hurt her left shoulder and her left elbow.  She states her left shoulder joint still hurts.  The bruising on her elbow is resolving.  She continues to have some stiffness in her hands.  She has severe osteoarthritis in her left knee joint which continues to cause a lot of discomfort.  Continues to have some generalized pain and discomfort from fibromyalgia.  She takes tramadol 50 mg 1 tablet p.o. twice daily as needed.  She also takes Ambien 5 mg p.o. nightly as needed for insomnia.  She states her pain level on the scale of 0-10 is about 8 without tramadol and with tramadol about 4-5.    Activities of Daily Living:  Patient reports morning stiffness for all day. Patient Reports nocturnal pain.  Difficulty dressing/grooming: Reports Difficulty climbing stairs: Reports Difficulty getting out of chair: Denies Difficulty using hands for taps, buttons, cutlery, and/or writing: Denies  Review of Systems  Constitutional:  Positive for fatigue.  HENT:  Positive for mouth sores and mouth dryness.   Eyes:  Negative for dryness.  Respiratory:  Negative for shortness of breath.   Cardiovascular:  Negative for chest pain and palpitations.  Gastrointestinal:  Negative for blood in stool, constipation and diarrhea.  Endocrine: Negative for increased urination.  Genitourinary:  Negative for difficulty urinating.  Musculoskeletal:  Positive for joint pain, joint pain, joint swelling,  myalgias, morning stiffness, muscle tenderness and myalgias. Negative for gait problem and muscle weakness.  Skin:  Positive for hair loss. Negative for color change, rash and sensitivity to sunlight.  Allergic/Immunologic: Negative for susceptible to infections.  Neurological:  Negative for dizziness and headaches.  Hematological:  Positive for swollen glands.  Psychiatric/Behavioral:  Positive for depressed mood and sleep disturbance. The patient is nervous/anxious.     PMFS History:  Patient Active Problem List   Diagnosis Date Noted   OSA on CPAP 01/27/2021   Diabetes mellitus without complication ([place]) 27/07/2374   Primary osteoarthritis of both hands 08/30/2017   Primary osteoarthritis of both knees 08/30/2017   DDD (degenerative disc disease), cervical 08/30/2017   DDD (degenerative disc disease), lumbar 08/30/2017   History of insomnia 08/30/2017   Fatty liver 08/30/2017   History of TIA (transient ischemic attack) 08/30/2017   S/p bilateral carpal tunnel release 08/30/2017   History of gastroesophageal reflux (GERD) 08/30/2017   History of vitamin D deficiency 08/30/2017   Spondylolisthesis of lumbar region 01/12/2017   Benign essential hypertension 05/06/2016   Oral herpes 05/06/2016   Carpal tunnel syndrome of left wrist 11/08/2015   Hyperlipidemia, unspecified 05/10/2015   Osteopenia 11/05/2014   Closed fracture of fibula 01/09/2014   Sleep apnea 01/06/2011   Restless leg syndrome 01/06/2011   Insomnia 01/06/2011   Fibromyalgia 01/06/2011   GERD (gastroesophageal reflux disease) 12/15/2007    Past Medical History:  Diagnosis Date   Arthritis    Broken ankle 11/2013   right ankle   Cataract    Bil/ surgery on right eye   Diabetes mellitus without complication ([place])    Fatty liver    Fibromyalgia 2005   GERD (gastroesophageal reflux disease)    uses prilosec   Headache(784.0)    not as often as before   Hypercholesteremia    Hypertension     Hyperthyroidism    OSA (obstructive sleep apnea)    CPAP   Plantar fasciitis    Post-operative nausea and vomiting    Restless leg syndrome    Schatzki's ring    Spondylolisthesis of lumbar region    Stress incontinence    Stroke ([place]) 2015   TIA.  unable to determine if this is true.   Thyroiditis    Vertigo     Family History  Problem Relation Age of Onset   Colon cancer Mother 27   Lung cancer Father 40   Colon cancer Maternal Grandfather    Breast cancer Neg Hx    Past Surgical History:  Procedure Laterality Date   CARPAL TUNNEL RELEASE Left 12/18/2015   Procedure: CARPAL TUNNEL RELEASE;  Surgeon: Leanor Kail, MD;  Location: [facility] ORS;  Service: Orthopedics;  Laterality: Left;   CARPAL TUNNEL RELEASE Right 2018   CATARACT EXTRACTION W/PHACO Right 11/13/2015   Procedure: CATARACT EXTRACTION PHACO AND INTRAOCULAR LENS PLACEMENT ([place]);  Surgeon: Estill Cotta, MD;  Location: [facility] OR[facility];  Service: Ophthalmology;  Laterality: Right;  [place] 01:08AP% 21.6CDE 26.99Fluid pack lot # [id number]   CATARACT EXTRACTION W/PHACO Left 12/04/2015   Procedure: CATARACT EXTRACTION PHACO AND INTRAOCULAR LENS PLACEMENT (IOC);  Surgeon: Estill Cotta, MD;  Location: [facility];  Service: Ophthalmology;  Laterality: Left;  [place] 59.2 AP% 24.4 CDE 29.2 Fluid Pack lot # [id number]   CERVICAL DISC SURGERY     cervical decompression c4-5 and c5-6   COLONOSCOPY W/ BIOPSIES AND POLYPECTOMY     CTR     right hand   EYE SURGERY Bilateral    cataract    LUMBAR SPINE SURGERY  2018   SHOULDER SURGERY     right shoulder   TRIGGER FINGER RELEASE     right finger   WISDOM TOOTH EXTRACTION     Social History   Social History Narrative   Married to [place], no natural children   Right handed   Bachelor's degree   2-3 cups daily         Immunization History  Administered Date(s) Administered   Influenza Inj Mdck Quad Pf 12/11/2016   Influenza Inj Mdck Quad With Preservative 12/13/2017    Influenza,inj,Quad PF,6+ Mos 10/29/2018   Influenza-Unspecified 02/15/2015, 01/01/2016, 12/02/2016, 12/22/2021   Moderna Covid-19 Vaccine Bivalent Booster 89yr & up 12/24/2021   PFIZER(Purple Top)SARS-COV-2 Vaccination 05/11/2019, 06/06/2019, 12/11/2019   Tdap 11/14/2007, 04/14/2017     Objective: Vital Signs: BP (!) 144/87 (BP Location: Left Arm, Patient Position: Sitting, Cuff Size: Large)   Pulse 83   Resp 16   Ht '5\' 1"'$  (1.549 m)   Wt 221 lb 3.2 oz (100.3 kg)   BMI 41.80 kg/m    Physical Exam Vitals and nursing note reviewed.  Constitutional:      Appearance: She is well-developed.  HENT:     Head: Normocephalic and atraumatic.  Eyes:     Conjunctiva/sclera: Conjunctivae normal.  Cardiovascular:     Rate and Rhythm: Normal rate and regular rhythm.     Heart sounds:  Normal heart sounds.  Pulmonary:     Effort: Pulmonary effort is normal.     Breath sounds: Normal breath sounds.  Abdominal:     General: Bowel sounds are normal.     Palpations: Abdomen is soft.  Musculoskeletal:     Cervical back: Normal range of motion.  Lymphadenopathy:     Cervical: No cervical adenopathy.  Skin:    General: Skin is warm and dry.     Capillary Refill: Capillary refill takes less than 2 seconds.  Neurological:     Mental Status: She is alert and oriented to person, place, and time.  Psychiatric:        Behavior: Behavior normal.      Musculoskeletal Exam: Cervical spine was in good range of motion.  She had discomfort range of motion of her left shoulder joint.  Elbow joints and wrist joints in good range of motion.  Some bruising over the left elbow.  Wrist joints and MCP joints with good range of motion.  She had bilateral PIP and DIP thickening with no synovitis.  Hip joints were in good range of motion.  She had good range of motion of the knee joints with discomfort.  Ankle joints were in good range of motion.  She had no tenderness over MTPs.  CDAI Exam: CDAI Score:  -- Patient Global: --; Provider Global: -- Swollen: --; Tender: -- Joint Exam 12/25/2021   No joint exam has been documented for this visit   There is currently no information documented on the homunculus. Go to the Rheumatology activity and complete the homunculus joint exam.  Investigation: No additional findings.  Imaging: No results found.  Recent Labs: Lab Results  Component Value Date   WBC 6.8 08/04/2017   HGB 12.5 08/04/2017   PLT 257 08/04/2017   NA 139 08/04/2017   K 4.2 08/04/2017   CL 104 08/04/2017   CO2 29 08/04/2017   GLUCOSE 111 (H) 08/04/2017   BUN 16 08/04/2017   CREATININE 0.83 08/04/2017   BILITOT 0.3 08/04/2017   ALKPHOS 70 01/07/2017   AST 58 (H) 08/04/2017   ALT 75 (H) 08/04/2017   PROT 7.1 08/04/2017   ALBUMIN 4.0 01/07/2017   CALCIUM 9.4 08/04/2017   GFRAA 89 08/04/2017    Speciality Comments: No specialty comments available.  Procedures:  No procedures performed Allergies: Codeine and Darvocet [propoxyphene n-acetaminophen]   Assessment / Plan:     Visit Diagnoses: Acute pain of left shoulder-patient had a fall about 2 weeks ago and she has been having discomfort in the left shoulder since then.  Her shoulder joint was in full range of motion with discomfort.  She had no point tenderness.  X-rays of the left shoulder joint were obtained.  X-rays were unremarkable.  X-ray findings were discussed with the patient.  A handout on shoulder joint exercises was given.  I advised her to contact us if her shoulder pain persists.  Topical use of anti-inflammatories was discussed.  Primary osteoarthritis of both hands-she had bilateral PIP and DIP thickening.  She had bilateral ring finger Dupuytren's contracture.  She had postsurgical scars from the previous carpal tunnel release.  No synovitis was noted.  Primary osteoarthritis of both knees-she has severe end-stage osteoarthritis in her knee joints with chondromalacia patella.  She has difficulty  walking.  She plans to have left total knee replacement next year.  DDD (degenerative disc disease), cervical-she had some limitation with lateral rotation of the cervical spine.  She had  no discomfort on palpation of the cervical spine.  She had bilateral trapezius spasm.  DDD (degenerative disc disease), lumbar-she continues to have some lower back pain due to underlying osteoarthritis.  Medication monitoring encounter -she takes tramadol '50mg'$  by mouth at bedtime as needed. UDS and narcotic agreement were updated on 07/03/2021.  Patient states that her pain level is about 8 on 0-10 with out tramadol and about 4 with tramadol.  Fibromyalgia-she continues to have generalized pain and discomfort from fibromyalgia.  She had multiple tender points and hyperalgesia.  She has been taking tramadol 50 mg p.o. twice daily as needed and Ambien 5 mg p.o. nightly.  She is also on Cymbalta.  Need for regular exercise, water aerobics and stretching was emphasized.  Osteopenia of multiple sites-she states she recently started Fosamax 70 mg p.o. weekly after drug holiday.  Use of calcium rich diet, vitamin D and exercise was emphasized.  Other medical problems are listed as follows:  History of vitamin D deficiency  History of recent fall  History of diabetes mellitus, type II  History of hypertension-blood pressure was elevated today.  She was advised to monitor blood pressure closely and follow-up with the PCP.  History of TIA (transient ischemic attack)  Fatty liver  History of gastroesophageal reflux (GERD)  RLS (restless legs syndrome)  History of migraine  History of sleep apnea  Orders: Orders Placed This Encounter  Procedures   XR Shoulder Left   DRUG MONITOR, TRAMADOL,QN, URINE   DRUG MONITOR, PANEL 5, W/CONF, URINE   No orders of the defined types were placed in this encounter.    Follow-Up Instructions: Return in about 6 months (around 06/25/2022) for Osteoarthritis.   Bo Merino, MD  Note - This record has been created using Editor, commissioning.  Chart creation errors have been sought, but may not always  have been located. Such creation errors do not reflect on  the standard of medical care.

## 2021-12-25 ENCOUNTER — Ambulatory Visit (INDEPENDENT_AMBULATORY_CARE_PROVIDER_SITE_OTHER): Payer: BC Managed Care – PPO

## 2021-12-25 ENCOUNTER — Encounter: Payer: Self-pay | Admitting: Rheumatology

## 2021-12-25 ENCOUNTER — Ambulatory Visit: Payer: BC Managed Care – PPO | Attending: Rheumatology | Admitting: Rheumatology

## 2021-12-25 VITALS — BP 144/87 | HR 83 | Resp 16 | Ht 61.0 in | Wt 221.2 lb

## 2021-12-25 DIAGNOSIS — M25512 Pain in left shoulder: Secondary | ICD-10-CM | POA: Diagnosis not present

## 2021-12-25 DIAGNOSIS — M5136 Other intervertebral disc degeneration, lumbar region: Secondary | ICD-10-CM

## 2021-12-25 DIAGNOSIS — Z8719 Personal history of other diseases of the digestive system: Secondary | ICD-10-CM

## 2021-12-25 DIAGNOSIS — M19041 Primary osteoarthritis, right hand: Secondary | ICD-10-CM | POA: Diagnosis not present

## 2021-12-25 DIAGNOSIS — M170 Bilateral primary osteoarthritis of knee: Secondary | ICD-10-CM

## 2021-12-25 DIAGNOSIS — M797 Fibromyalgia: Secondary | ICD-10-CM

## 2021-12-25 DIAGNOSIS — M5030 Other cervical disc degeneration, unspecified cervical region: Secondary | ICD-10-CM | POA: Diagnosis not present

## 2021-12-25 DIAGNOSIS — Z9181 History of falling: Secondary | ICD-10-CM

## 2021-12-25 DIAGNOSIS — M51369 Other intervertebral disc degeneration, lumbar region without mention of lumbar back pain or lower extremity pain: Secondary | ICD-10-CM

## 2021-12-25 DIAGNOSIS — Z8669 Personal history of other diseases of the nervous system and sense organs: Secondary | ICD-10-CM

## 2021-12-25 DIAGNOSIS — Z8679 Personal history of other diseases of the circulatory system: Secondary | ICD-10-CM

## 2021-12-25 DIAGNOSIS — Z8639 Personal history of other endocrine, nutritional and metabolic disease: Secondary | ICD-10-CM

## 2021-12-25 DIAGNOSIS — M19042 Primary osteoarthritis, left hand: Secondary | ICD-10-CM

## 2021-12-25 DIAGNOSIS — K760 Fatty (change of) liver, not elsewhere classified: Secondary | ICD-10-CM

## 2021-12-25 DIAGNOSIS — G2581 Restless legs syndrome: Secondary | ICD-10-CM

## 2021-12-25 DIAGNOSIS — Z8673 Personal history of transient ischemic attack (TIA), and cerebral infarction without residual deficits: Secondary | ICD-10-CM

## 2021-12-25 DIAGNOSIS — Z5181 Encounter for therapeutic drug level monitoring: Secondary | ICD-10-CM

## 2021-12-25 DIAGNOSIS — M8589 Other specified disorders of bone density and structure, multiple sites: Secondary | ICD-10-CM

## 2021-12-25 NOTE — Patient Instructions (Signed)

## 2021-12-27 LAB — DRUG MONITOR, TRAMADOL,QN, URINE
Desmethyltramadol: 3883 ng/mL — ABNORMAL HIGH (ref <100)
Tramadol: >10000 ng/mL — ABNORMAL HIGH (ref <100)

## 2021-12-27 LAB — DRUG MONITOR, PANEL 5, W/CONF, URINE
Amphetamines: NEGATIVE ng/mL (ref <500)
Barbiturates: NEGATIVE ng/mL (ref <300)
Benzodiazepines: NEGATIVE ng/mL (ref <100)
Cocaine Metabolite: NEGATIVE ng/mL (ref <150)
Creatinine: 85.1 mg/dL (ref >=20.0)
Marijuana Metabolite: NEGATIVE ng/mL (ref <20)
Methadone Metabolite: NEGATIVE ng/mL (ref <100)
Opiates: NEGATIVE ng/mL (ref <100)
Oxidant: NEGATIVE ug/mL (ref <200)
Oxycodone: NEGATIVE ng/mL (ref <100)
pH: 6.9 (ref 4.5–9.0)

## 2021-12-27 LAB — DM TEMPLATE

## 2021-12-28 NOTE — Progress Notes (Signed)
UDS consistent with tramadol use.

## 2022-01-01 ENCOUNTER — Other Ambulatory Visit: Payer: Self-pay | Admitting: Physician Assistant

## 2022-01-01 MED ORDER — ZOLPIDEM TARTRATE 5 MG PO TABS: ORAL_TABLET | ORAL | 0 refills | Status: DC

## 2022-01-01 NOTE — Telephone Encounter (Addendum)
Next Visit: 06/25/2022  Last Visit: 12/25/2021  UDS:12/25/2021 UDS consistent with tramadol use.   Narc Agreement: 12/25/2021  Current dose per office note 12/25/2021: tramadol 50 mg p.o. twice daily as needed and Ambien 5 mg p.o. nightly.   Dx: Fibromyalgia  Last Fill: 12/01/2021  Okay to refill Tramadol?

## 2022-01-01 NOTE — Telephone Encounter (Signed)
From: [name] To: Office of Ofilia Neas, [place] Sent: 01/01/2022 10:25 AM EST Subject: Medication Renewal Request  Refills have been requested for the following medications:   traMADol (ULTRAM) 50 MG tablet Geni [place]]  Preferred pharmacy: [id number] [address], [place] Delivery method: [employer]

## 2022-01-26 ENCOUNTER — Ambulatory Visit: Payer: BC Managed Care – PPO | Admitting: Internal Medicine

## 2022-01-26 NOTE — Progress Notes (Signed)
Sleep Medicine   Office Visit  Patient Name: Carolyn Graham DOB: 11/18/1962 MRN [id number]    Chief Complaint: ***  Brief History:  Carolyn presents for initial sleep consult with a *** history of ***. Sleep quality is ***. This is noted *** nights. The patient's bed partner reports  *** at night. The patient relates the following symptoms: *** are also present. The patient goes to sleep at *** and wakes up at ***.   Sleep quality is *** when outside home environment.  Patient has noted *** of her legs at night.  The patient  relates *** behavior during the night.  The patient *** a history of psychiatric problems. The Epworth Sleepiness Score is *** out of 24 .  The patient relates  Cardiovascular risk factors include: *** The patient reports ***    ROS  General: (-) fever, (-) chills, (-) night sweat Nose and Sinuses: (-) nasal stuffiness or itchiness, (-) postnasal drip, (-) nosebleeds, (-) sinus trouble. Mouth and Throat: (-) sore throat, (-) hoarseness. Neck: (-) swollen glands, (-) enlarged thyroid, (-) neck pain. Respiratory: *** cough, *** shortness of breath, *** wheezing. Neurologic: *** numbness, *** tingling. Psychiatric: *** anxiety, *** depression Sleep behavior: ***sleep paralysis ***hypnogogic hallucinations ***dream enactment      ***vivid dreams ***cataplexy ***night terrors ***sleep walking   Current Medication: No outpatient encounter medications on file as of 04/16/2022.   No facility-administered encounter medications on file as of 04/16/2022.    Surgical History: *** The histories are not reviewed yet. Please review them in the "History" navigator section and refresh this SmartLink.  Medical History: No past medical history on file.  Family History: Non contributory to the present illness  Social History: Social History   Socioeconomic History   Marital status: Not on file    Spouse name: Not on file   Number of children: Not on file   Years of  education: Not on file   Highest education level: Not on file  Occupational History   Not on file  Tobacco Use   Smoking status: Not on file   Smokeless tobacco: Not on file  Substance and Sexual Activity   Alcohol use: Not on file   Drug use: Not on file   Sexual activity: Not on file  Other Topics Concern   Not on file  Social History Narrative   Not on file   Social Determinants of Health   Financial Resource Strain: Not on file  Food Insecurity: Not on file  Transportation Needs: Not on file  Physical Activity: Not on file  Stress: Not on file  Social Connections: Not on file  Intimate Partner Violence: Not on file    Vital Signs: There were no vitals taken for this visit. There is no height or weight on file to calculate BMI.   Examination: General Appearance: The patient is well-developed, well-nourished, and in no distress. Neck Circumference: *** Skin: Gross inspection of skin unremarkable. Head: normocephalic, no gross deformities. Eyes: no gross deformities noted. ENT: ears appear grossly normal Neurologic: Alert and oriented. No involuntary movements.    STOP BANG RISK ASSESSMENT S (snore) Have you been told that you snore?     YES/N   T (tired) Are you often tired, fatigued, or sleepy during the day?   YES/NO  O (obstruction) Do you stop breathing, choke, or gasp during sleep? YES/NO   P (pressure) Do you have or are you being treated for high blood pressure? YES/NO   B (

## 2022-01-29 ENCOUNTER — Other Ambulatory Visit: Payer: Self-pay | Admitting: *Deleted

## 2022-01-29 MED ORDER — TRAMADOL HCL 50 MG PO TABS: ORAL_TABLET | ORAL | 0 refills | Status: DC

## 2022-01-29 MED ORDER — ZOLPIDEM TARTRATE 5 MG PO TABS: ORAL_TABLET | ORAL | 0 refills | Status: DC

## 2022-01-29 NOTE — Telephone Encounter (Signed)
Next Visit: 06/25/2022  Last Visit: 12/25/2021  UDS:12/25/2021 UDS consistent with tramadol use.   Narc Agreement: 12/25/2021  Last Fill: 01/01/2022  Okay to refill Ambien and Tramadol?

## 2022-02-02 ENCOUNTER — Telehealth: Payer: Self-pay | Admitting: Rheumatology

## 2022-02-02 NOTE — Telephone Encounter (Signed)
Returned the call to the pharmacy and provided dx code for fibromyalgia.

## 2022-02-02 NOTE — Telephone Encounter (Signed)
[place] called the office stating they received a prescription of Tramadol for the patient and needed clarification regarding the diagnosis code associated with the prescription. [id number]

## 2022-02-26 ENCOUNTER — Other Ambulatory Visit: Payer: Self-pay | Admitting: Rheumatology

## 2022-02-26 NOTE — Telephone Encounter (Signed)
Next Visit: 06/25/2022   Last Visit: 12/25/2021   UDS:12/25/2021 UDS consistent with tramadol use.    Narc Agreement: 12/25/2021   Last Fill: 01/29/2022   Okay to refill Ambien?

## 2022-03-02 ENCOUNTER — Other Ambulatory Visit: Payer: Self-pay | Admitting: *Deleted

## 2022-03-02 MED ORDER — TRAMADOL HCL 50 MG PO TABS: ORAL_TABLET | ORAL | 0 refills | Status: DC

## 2022-03-02 NOTE — Telephone Encounter (Signed)
Patient contacted the office and requested refill on Tramadol.   Next Visit: 06/25/2022   Last Visit: 12/25/2021   UDS:12/25/2021 UDS consistent with tramadol use.    Narc Agreement: 12/25/2021   Last Fill: 01/29/2022   Okay to refill Tramadol?

## 2022-03-30 ENCOUNTER — Other Ambulatory Visit: Payer: Self-pay | Admitting: Rheumatology

## 2022-03-30 MED ORDER — TRAMADOL HCL 50 MG PO TABS: ORAL_TABLET | ORAL | 0 refills | Status: DC

## 2022-03-30 MED ORDER — ZOLPIDEM TARTRATE 5 MG PO TABS: ORAL_TABLET | ORAL | 0 refills | Status: DC

## 2022-03-30 NOTE — Telephone Encounter (Signed)
From: [name] To: Office of Ofilia Neas, [place] Sent: 03/30/2022 9:57 AM EST Subject: Medication Renewal Request  Refills have been requested for the following medications:   traMADol (ULTRAM) 50 MG tablet Geni [place]]  Preferred pharmacy: [facility] [place], [place] Delivery method: Pickup   Medication renewals requested in this message routed separately:   zolpidem (AMBIEN) 5 MG tablet [Shaili Deveshwar]

## 2022-03-30 NOTE — Telephone Encounter (Signed)
Next Visit: 06/25/2022   Last Visit: 12/25/2021   UDS:12/25/2021 UDS consistent with tramadol use.    Narc Agreement: 12/25/2021   Last Fill: 03/02/2022 (Tramadol), 02/26/2022 (Ambien)   Okay to refill Tramadol and Ambien?

## 2022-03-31 ENCOUNTER — Other Ambulatory Visit: Payer: Self-pay | Admitting: Rheumatology

## 2022-04-01 NOTE — Telephone Encounter (Signed)
Called the pharmacy and provided dx code for prescription. Also advised it is for a chronic condition, not an acute problem.

## 2022-04-24 HISTORY — PX: VAGINA SURGERY: SHX829

## 2022-04-27 ENCOUNTER — Other Ambulatory Visit: Payer: Self-pay | Admitting: Rheumatology

## 2022-04-27 MED ORDER — ZOLPIDEM TARTRATE 5 MG PO TABS: ORAL_TABLET | ORAL | 0 refills | Status: DC

## 2022-04-27 MED ORDER — TRAMADOL HCL 50 MG PO TABS: ORAL_TABLET | ORAL | 0 refills | Status: DC

## 2022-04-27 NOTE — Telephone Encounter (Signed)
Next Visit: 06/25/2022   Last Visit: 12/25/2021   UDS:12/25/2021 UDS consistent with tramadol use.    Narc Agreement: 12/25/2021   Last Fill: 03/30/2022 (Tramadol), 03/30/2022 (Ambien)   Okay to refill Tramadol and Ambien?

## 2022-05-29 ENCOUNTER — Other Ambulatory Visit: Payer: Self-pay | Admitting: *Deleted

## 2022-05-29 MED ORDER — ZOLPIDEM TARTRATE 5 MG PO TABS: ORAL_TABLET | ORAL | 0 refills | Status: DC

## 2022-05-29 MED ORDER — TRAMADOL HCL 50 MG PO TABS: ORAL_TABLET | ORAL | 0 refills | Status: DC

## 2022-05-29 NOTE — Telephone Encounter (Signed)
Patient contacted the office and requested refill on Tramadol and Ambien.  Last Fill: 04/27/2022  UDS:12/25/2021 UDS consistent with tramadol use   Narc Agreement: 12/25/2021  Next Visit: 06/25/2022  Last Visit: 12/25/2021  Dx: Fibromyalgia   Current Dose per office note on 12/25/2021: tramadol 50 mg 1 tablet p.o. twice daily as needed. Ambien 5 mg p.o. nightly as needed for insomnia    Okay to refill Tramadol and Ambien?

## 2022-06-11 NOTE — Progress Notes (Unsigned)
Office Visit Note  Patient: Carolyn Graham             Date of Birth: Jun 29, 1955           MRN: [id number]             PCP: Dorothey Baseman, MD Referring: Dorothey Baseman, MD Visit Date: 06/25/2022 Occupation: @GUAROCC @  Subjective:  Increased pain   History of Present Illness: Carolyn Graham is a 67 y.o. female with history of osteoarthritis, fibromyalgia, and DDD.  Patient continues to experience intermittent arthralgias and myalgias due to underlying osteoarthritis and fibromyalgia.  She has been having increased myofascial pain especially in the trapezius muscles bilaterally.  She is also had some increased discomfort in her both elbows and her lower back.  She has reached out to Dr. Verlee Rossetti office to schedule a follow-up visit but has not yet heard back.  She takes tramadol 50 mg 1 tablet at bedtime for pain relief.  She is also been having to take ibuprofen to help better manage her symptoms.  She uses Voltaren gel topically as needed.  She remains on Ambien 5 mg at bedtime as needed for insomnia.  She continues to have difficulty sleeping at night. She remains on Fosamax as prescribed.   Activities of Daily Living:  Patient reports morning stiffness for 1 hour.   Patient Reports nocturnal pain.  Difficulty dressing/grooming: Denies Difficulty climbing stairs: Reports Difficulty getting out of chair: Denies Difficulty using hands for taps, buttons, cutlery, and/or writing: Denies  Review of Systems  Constitutional:  Positive for fatigue.  HENT:  Positive for mouth sores and mouth dryness. Negative for nose dryness.   Eyes:  Negative for pain, visual disturbance and dryness.  Respiratory:  Negative for cough, hemoptysis, shortness of breath and difficulty breathing.   Cardiovascular:  Negative for chest pain, palpitations, hypertension and swelling in legs/feet.  Gastrointestinal:  Negative for blood in stool, constipation and diarrhea.  Endocrine: Positive for increased  urination.  Genitourinary:  Negative for painful urination and involuntary urination.  Musculoskeletal:  Positive for joint pain, joint pain, joint swelling, myalgias, muscle weakness, morning stiffness, muscle tenderness and myalgias. Negative for gait problem.  Skin:  Positive for rash. Negative for color change, pallor, hair loss, nodules/bumps, skin tightness, ulcers and sensitivity to sunlight.  Allergic/Immunologic: Negative for susceptible to infections.  Neurological:  Positive for headaches. Negative for dizziness, numbness and weakness.  Hematological:  Negative for swollen glands.  Psychiatric/Behavioral:  Positive for sleep disturbance. Negative for depressed mood. The patient is nervous/anxious.     PMFS History:  Patient Active Problem List   Diagnosis Date Noted   OSA on CPAP 01/27/2021   Diabetes mellitus without complication (HCC) 06/13/2018   Primary osteoarthritis of both hands 08/30/2017   Primary osteoarthritis of both knees 08/30/2017   DDD (degenerative disc disease), cervical 08/30/2017   DDD (degenerative disc disease), lumbar 08/30/2017   History of insomnia 08/30/2017   Fatty liver 08/30/2017   History of TIA (transient ischemic attack) 08/30/2017   S/p bilateral carpal tunnel release 08/30/2017   History of gastroesophageal reflux (GERD) 08/30/2017   History of vitamin D deficiency 08/30/2017   Spondylolisthesis of lumbar region 01/12/2017   Benign essential hypertension 05/06/2016   Oral herpes 05/06/2016   Carpal tunnel syndrome of left wrist 11/08/2015   Hyperlipidemia, unspecified 05/10/2015   Osteopenia 11/05/2014   Closed fracture of fibula 01/09/2014   Sleep apnea 01/06/2011   Restless leg syndrome 01/06/2011   Insomnia  01/06/2011   Fibromyalgia 01/06/2011   GERD (gastroesophageal reflux disease) 12/15/2007    Past Medical History:  Diagnosis Date   Arthritis    Broken ankle 11/2013   right ankle   Cataract    Bil/ surgery on right eye    Diabetes mellitus without complication (HCC)    Fatty liver    Fibromyalgia 2005   GERD (gastroesophageal reflux disease)    uses prilosec   Headache(784.0)    not as often as before   Hypercholesteremia    Hypertension    Hyperthyroidism    OSA (obstructive sleep apnea)    CPAP   Plantar fasciitis    Post-operative nausea and vomiting    Restless leg syndrome    Schatzki's ring    Spondylolisthesis of lumbar region    Stress incontinence    Stroke (HCC) 2015   TIA.  unable to determine if this is true.   Thyroiditis    Vertigo     Family History  Problem Relation Age of Onset   Colon cancer Mother 64   Lung cancer Father 7   Colon cancer Maternal Grandfather    Breast cancer Neg Hx    Past Surgical History:  Procedure Laterality Date   CARPAL TUNNEL RELEASE Left 12/18/2015   Procedure: CARPAL TUNNEL RELEASE;  Surgeon: Erin Sons, MD;  Location: [facility];  Service: Orthopedics;  Laterality: Left;   CARPAL TUNNEL RELEASE Right 2018   CATARACT EXTRACTION W/PHACO Right 11/13/2015   Procedure: CATARACT EXTRACTION PHACO AND INTRAOCULAR LENS PLACEMENT (IOC);  Surgeon: Sallee Lange, MD;  Location: [facility];  Service: Ophthalmology;  Laterality: Right;  [place] 01:08AP% 21.6CDE 26.99Fluid pack lot # [id number]   CATARACT EXTRACTION W/PHACO Left 12/04/2015   Procedure: CATARACT EXTRACTION PHACO AND INTRAOCULAR LENS PLACEMENT (IOC);  Surgeon: Sallee Lange, MD;  Location: [facility];  Service: Ophthalmology;  Laterality: Left;  [place] 59.2 AP% 24.4 CDE 29.2 Fluid Pack lot # [id number]   CERVICAL DISC SURGERY     cervical decompression c4-5 and c5-6   COLONOSCOPY W/ BIOPSIES AND POLYPECTOMY     CTR     right hand   EYE SURGERY Bilateral    cataract    LUMBAR SPINE SURGERY  2018   SHOULDER SURGERY     right shoulder   TRIGGER FINGER RELEASE     right finger   WISDOM TOOTH EXTRACTION     Social History   Social History Narrative   Married to [place], no natural children    Right handed   Bachelor's degree   2-3 cups daily         Immunization History  Administered Date(s) Administered   Influenza Inj Mdck Quad Pf 12/11/2016   Influenza Inj Mdck Quad With Preservative 12/13/2017   Influenza,inj,Quad PF,6+ Mos 10/29/2018   Influenza-Unspecified 02/15/2015, 01/01/2016, 12/02/2016, 12/22/2021   Moderna Covid-19 Vaccine Bivalent Booster 54yrs & up 12/24/2021   PFIZER(Purple Top)SARS-COV-2 Vaccination 05/11/2019, 06/06/2019, 12/11/2019   Tdap 11/14/2007, 04/14/2017     Objective: Vital Signs: BP 119/76 (BP Location: Left Arm, Patient Position: Sitting, Cuff Size: Small)   Pulse 78   Resp 13   Ht 5\' 1"  (1.549 m)   Wt 211 lb (95.7 kg)   BMI 39.87 kg/m    Physical Exam Vitals and nursing note reviewed.  Constitutional:      Appearance: She is well-developed.  HENT:     Head: Normocephalic and atraumatic.  Eyes:     Conjunctiva/sclera: Conjunctivae normal.  Cardiovascular:  Rate and Rhythm: Normal rate and regular rhythm.     Heart sounds: Normal heart sounds.  Pulmonary:     Effort: Pulmonary effort is normal.     Breath sounds: Normal breath sounds.  Abdominal:     General: Bowel sounds are normal.     Palpations: Abdomen is soft.  Musculoskeletal:     Cervical back: Normal range of motion.  Lymphadenopathy:     Cervical: No cervical adenopathy.  Skin:    General: Skin is warm and dry.     Capillary Refill: Capillary refill takes less than 2 seconds.  Neurological:     Mental Status: She is alert and oriented to person, place, and time.  Psychiatric:        Behavior: Behavior normal.      Musculoskeletal Exam: Generalized hyperalgesia and positive tender points on exam.  C-spine has good range of motion.  Trapezius muscle tension tenderness bilaterally.  Midline spinal tenderness noted.  Some tenderness over bilateral SI joints.  Painful range of motion of the lumbar spine.  Shoulder joints have good range of motion with some  discomfort bilaterally.  Tenderness over the deltoid insertion site bilaterally.  Tenderness over the medial lateral epicondyle of both elbows.  Wrist joints, MCPs, PIPs, DIPs have good range of motion with no synovitis.  Complete fist formation bilaterally.  Hip joints have good range of motion with no groin pain.  Tenderness over bilateral trochanteric bursa.  Knee joints have good range of motion with no warmth or effusion.  Ankle joints have good range of motion with no tenderness or joint swelling.  No tenderness or synovitis over MTP joints.  No evidence of Achilles tendinitis or plantar fasciitis.  CDAI Exam: CDAI Score: -- Patient Global: --; Provider Global: -- Swollen: --; Tender: -- Joint Exam 06/25/2022   No joint exam has been documented for this visit   There is currently no information documented on the homunculus. Go to the Rheumatology activity and complete the homunculus joint exam.  Investigation: No additional findings.  Imaging: No results found.  Recent Labs: Lab Results  Component Value Date   WBC 6.8 08/04/2017   HGB 12.5 08/04/2017   PLT 257 08/04/2017   NA 139 08/04/2017   K 4.2 08/04/2017   CL 104 08/04/2017   CO2 29 08/04/2017   GLUCOSE 111 (H) 08/04/2017   BUN 16 08/04/2017   CREATININE 0.83 08/04/2017   BILITOT 0.3 08/04/2017   ALKPHOS 70 01/07/2017   AST 58 (H) 08/04/2017   ALT 75 (H) 08/04/2017   PROT 7.1 08/04/2017   ALBUMIN 4.0 01/07/2017   CALCIUM 9.4 08/04/2017   GFRAA 89 08/04/2017    Speciality Comments: No specialty comments available.  Procedures:  No procedures performed Allergies: Codeine and Darvocet [propoxyphene n-acetaminophen]   Assessment / Plan:     Visit Diagnoses: Primary osteoarthritis of both hands: She has PIP and DIP thickening consistent with osteoarthritis of both hands.  No tenderness or synovitis noted today.  Complete fist formation noted bilaterally.  Primary osteoarthritis of both knees: She has good range  of motion of both knee joints on examination today.  No warmth or effusion noted.  She had a left knee joint cortisone injection performed on 07/03/2021 which alleviated her discomfort.  DDD (degenerative disc disease), cervical: C-spine has good range of motion.  She has trapezius muscle tension and tenderness bilaterally.  DDD (degenerative disc disease), lumbar: Chronic pain.  Previous surgery performed by Dr. Danielle Dess.  Patient is  been experiencing increased discomfort in her lower back.  She has been more sedentary than usual due to the discomfort.  She has called Dr. Suzan Nailer office to schedule an appointment but has not heard back yet.  She plans on following up with Dr. Danielle Dess for further evaluation.  She will remain on tramadol 50 mg 1 tablet daily for pain relief.  Medication monitoring encounter -UDS updated today.  Plan: DRUG MONITOR, TRAMADOL,QN, URINE, DRUG MONITOR, PANEL 5, W/CONF, URINE  Fibromyalgia: She has generalized hyperalgesia and positive tender points on examination.  Patient presents today with trapezius muscle tension and tenderness bilaterally.  She also has tenderness over the deltoid insertion site as well as over the bilateral medial lateral epicondyles.  She has been experiencing increased myofascial pain despite taking Cymbalta and tramadol as prescribed.  She is been having to take ibuprofen more frequently due to the severity of pain.  Discussed the importance of regular exercise and good sleep hygiene.  A refill of tramadol will be sent to the pharmacy.  She remains on Ambien 5 mg at bedtime which has helped with sleep onset but continues to have interrupted sleep at night. Patient was advised to notify us when and if she would like referral placed to pain management.  Osteopenia of multiple sites - DEXA updated on 08/18/21.  She is taking a calcium and vitamin D supplement daily.  She remains on Fosamax 70 mg 1 tablet by mouth once weekly.  History of vitamin D  deficiency: She is taking calcium and vitamin D supplement daily.  Other medical conditions are listed as follows:  History of recent fall  History of diabetes mellitus, type II  History of TIA (transient ischemic attack)  Fatty liver  History of gastroesophageal reflux (GERD)  RLS (restless legs syndrome)  History of migraine  History of sleep apnea  History of hypertension: Blood pressure is 119/76 today in the office.  Orders: Orders Placed This Encounter  Procedures   DRUG MONITOR, TRAMADOL,QN, URINE   DRUG MONITOR, PANEL 5, W/CONF, URINE   No orders of the defined types were placed in this encounter.  Follow-Up Instructions: Return in about 6 months (around 12/26/2022) for Osteoarthritis, Fibromyalgia, DDD.   Gearldine Bienenstock, PA-C  Note - This record has been created using Dragon software.  Chart creation errors have been sought, but may not always  have been located. Such creation errors do not reflect on  the standard of medical care.

## 2022-06-15 ENCOUNTER — Other Ambulatory Visit: Payer: Self-pay | Admitting: Family Medicine

## 2022-06-15 DIAGNOSIS — Z1231 Encounter for screening mammogram for malignant neoplasm of breast: Secondary | ICD-10-CM

## 2022-06-25 ENCOUNTER — Encounter: Payer: Self-pay | Admitting: Physician Assistant

## 2022-06-25 ENCOUNTER — Other Ambulatory Visit: Payer: Self-pay

## 2022-06-25 ENCOUNTER — Ambulatory Visit: Payer: Medicare Other | Attending: Physician Assistant | Admitting: Physician Assistant

## 2022-06-25 VITALS — BP 119/76 | HR 78 | Resp 13 | Ht 61.0 in | Wt 211.0 lb

## 2022-06-25 DIAGNOSIS — Z9181 History of falling: Secondary | ICD-10-CM | POA: Diagnosis present

## 2022-06-25 DIAGNOSIS — K760 Fatty (change of) liver, not elsewhere classified: Secondary | ICD-10-CM | POA: Insufficient documentation

## 2022-06-25 DIAGNOSIS — Z8639 Personal history of other endocrine, nutritional and metabolic disease: Secondary | ICD-10-CM | POA: Diagnosis present

## 2022-06-25 DIAGNOSIS — Z8673 Personal history of transient ischemic attack (TIA), and cerebral infarction without residual deficits: Secondary | ICD-10-CM | POA: Diagnosis present

## 2022-06-25 DIAGNOSIS — Z8719 Personal history of other diseases of the digestive system: Secondary | ICD-10-CM | POA: Insufficient documentation

## 2022-06-25 DIAGNOSIS — M170 Bilateral primary osteoarthritis of knee: Secondary | ICD-10-CM | POA: Insufficient documentation

## 2022-06-25 DIAGNOSIS — Z8679 Personal history of other diseases of the circulatory system: Secondary | ICD-10-CM | POA: Diagnosis present

## 2022-06-25 DIAGNOSIS — M5136 Other intervertebral disc degeneration, lumbar region: Secondary | ICD-10-CM | POA: Diagnosis present

## 2022-06-25 DIAGNOSIS — M19042 Primary osteoarthritis, left hand: Secondary | ICD-10-CM | POA: Insufficient documentation

## 2022-06-25 DIAGNOSIS — M5030 Other cervical disc degeneration, unspecified cervical region: Secondary | ICD-10-CM | POA: Diagnosis present

## 2022-06-25 DIAGNOSIS — M19041 Primary osteoarthritis, right hand: Secondary | ICD-10-CM | POA: Diagnosis present

## 2022-06-25 DIAGNOSIS — Z5181 Encounter for therapeutic drug level monitoring: Secondary | ICD-10-CM | POA: Diagnosis present

## 2022-06-25 DIAGNOSIS — Z8669 Personal history of other diseases of the nervous system and sense organs: Secondary | ICD-10-CM | POA: Diagnosis present

## 2022-06-25 DIAGNOSIS — M8589 Other specified disorders of bone density and structure, multiple sites: Secondary | ICD-10-CM | POA: Diagnosis present

## 2022-06-25 DIAGNOSIS — M797 Fibromyalgia: Secondary | ICD-10-CM | POA: Diagnosis present

## 2022-06-25 DIAGNOSIS — G2581 Restless legs syndrome: Secondary | ICD-10-CM | POA: Insufficient documentation

## 2022-06-25 MED ORDER — ZOLPIDEM TARTRATE 5 MG PO TABS: ORAL_TABLET | ORAL | 0 refills | Status: DC

## 2022-06-25 MED ORDER — DICLOFENAC SODIUM 1 % EX GEL: CUTANEOUS | 2 refills | Status: AC

## 2022-06-25 MED ORDER — TRAMADOL HCL 50 MG PO TABS: ORAL_TABLET | ORAL | 0 refills | Status: DC

## 2022-06-25 NOTE — Telephone Encounter (Signed)
Please review and sign pended refills. Thanks!

## 2022-06-26 LAB — DRUG MONITOR, PANEL 5, W/CONF, URINE
Benzodiazepines: NEGATIVE ng/mL (ref <100)
Creatinine: 47.1 mg/dL (ref >=20.0)
Marijuana Metabolite: NEGATIVE ng/mL (ref <20)
Oxidant: NEGATIVE ug/mL (ref <200)
pH: 5.8 (ref 4.5–9.0)

## 2022-06-27 LAB — DRUG MONITOR, PANEL 5, W/CONF, URINE
Amphetamines: NEGATIVE ng/mL (ref <500)
Barbiturates: NEGATIVE ng/mL (ref <300)
Cocaine Metabolite: NEGATIVE ng/mL (ref <150)
Methadone Metabolite: NEGATIVE ng/mL (ref <100)
Opiates: NEGATIVE ng/mL (ref <100)
Oxycodone: NEGATIVE ng/mL (ref <100)

## 2022-06-27 LAB — DRUG MONITOR, TRAMADOL,QN, URINE
Desmethyltramadol: 1884 ng/mL — ABNORMAL HIGH (ref <100)
Tramadol: >10000 ng/mL — ABNORMAL HIGH (ref <100)

## 2022-06-27 LAB — DM TEMPLATE

## 2022-06-29 NOTE — Progress Notes (Signed)
UDS is consistent with treatment.

## 2022-07-06 ENCOUNTER — Ambulatory Visit
Admission: RE | Admit: 2022-07-06 | Discharge: 2022-07-06 | Disposition: A | Discharge status: Home / Self Care | Payer: Medicare Other | Source: Ambulatory Visit | Attending: Family Medicine | Admitting: Family Medicine

## 2022-07-06 DIAGNOSIS — Z1231 Encounter for screening mammogram for malignant neoplasm of breast: Secondary | ICD-10-CM | POA: Insufficient documentation

## 2022-07-30 ENCOUNTER — Other Ambulatory Visit: Payer: Self-pay | Admitting: Physician Assistant

## 2022-07-30 NOTE — Telephone Encounter (Signed)
Last Fill: 06/25/2022  UDS:06/25/2022 UDS is consistent with treatment   Narc Agreement: 06/25/2022  Next Visit: 11/17/2022  Last Visit: 06/25/2022  Dx: DDD (degenerative disc disease), lumbar   Current Dose per office note on 06/25/2022: tramadol 50 mg 1 tablet daily for pain relief.   Okay to refill Tramadol?

## 2022-07-31 ENCOUNTER — Other Ambulatory Visit: Payer: Self-pay | Admitting: Physician Assistant

## 2022-07-31 NOTE — Telephone Encounter (Signed)
Last Fill: 06/25/2022  Next Visit: 11/17/2022  Last Visit: 06/25/2022  DX: Fibromyalgia  Current Dose per office note on 06/25/2022: She remains on Ambien 5 mg at bedtime which has helped with sleep onset but continues to have interrupted sleep at night.   Okay to refill ambien?

## 2022-08-07 ENCOUNTER — Encounter: Payer: Self-pay | Admitting: Internal Medicine

## 2022-08-24 ENCOUNTER — Ambulatory Visit (INDEPENDENT_AMBULATORY_CARE_PROVIDER_SITE_OTHER): Payer: Medicare Other | Admitting: Internal Medicine

## 2022-08-24 VITALS — BP 144/78 | HR 89 | Resp 14 | Ht 61.0 in | Wt 210.0 lb

## 2022-08-24 DIAGNOSIS — Z7189 Other specified counseling: Secondary | ICD-10-CM

## 2022-08-24 DIAGNOSIS — E669 Obesity, unspecified: Secondary | ICD-10-CM

## 2022-08-24 DIAGNOSIS — I10 Essential (primary) hypertension: Secondary | ICD-10-CM | POA: Diagnosis not present

## 2022-08-24 DIAGNOSIS — G4733 Obstructive sleep apnea (adult) (pediatric): Secondary | ICD-10-CM

## 2022-08-24 NOTE — Progress Notes (Signed)
[facility] [address] [place], [place] [address]  Pulmonary Sleep Medicine   Office Visit Note  Patient Name: Carolyn Graham DOB: 27-Nov-1955 MRN [id number]    Chief Complaint: Obstructive Sleep Apnea visit  Brief History:  Carolyn Graham is seen today for an annual follow up on APAP at 12-20 cmh20.  The patient has a 10 year history of sleep apnea. Patient is using PAP nightly.  The patient feels rested after sleeping with PAP.  The patient reports benefiting from PAP use. Reported sleepiness is  not improved and the Epworth Sleepiness Score is 19 out of 24. The patient does take naps. The patient complains of the following: No complaints.  The compliance download shows 85% compliance with an average use time of 6 hours. The AHI is 2.6.  The patient does complain of limb movements disrupting sleep. She is on pramipexole for restless legs.   ROS  General: (-) fever, (-) chills, (-) night sweat Nose and Sinuses: (-) nasal stuffiness or itchiness, (-) postnasal drip, (-) nosebleeds, (-) sinus trouble. Mouth and Throat: (-) sore throat, (-) hoarseness. Neck: (-) swollen glands, (-) enlarged thyroid, (-) neck pain. Respiratory: - cough, - shortness of breath, -- wheezing. Neurologic: - numbness, - tingling. Psychiatric: - anxiety, - depression   Current Medication: Outpatient Encounter Medications as of 08/24/2022  Medication Sig   Dulaglutide (TRULICITY) 0.75 MG/0.5ML SOPN Inject into the skin.   alendronate (FOSAMAX) 70 MG tablet    Ascorbic Acid (VITAMIN C PO) Take by mouth daily.   b complex vitamins capsule Take 1 capsule by mouth daily.   bisoprolol-hydrochlorothiazide (ZIAC) 2.5-6.25 MG tablet Take 1 tablet by mouth daily.   Calcium Carbonate-Vitamin D (CALCIUM 600+D PO) Take 1 tablet daily by mouth.    Cholecalciferol (VITAMIN D) 2000 units CAPS Take 2,000 Units daily by mouth.    diclofenac Sodium (VOLTAREN) 1 % GEL Apply 2-4 grams to affected joint 4 times daily as  needed.   DULoxetine (CYMBALTA) 30 MG capsule Take by mouth.   ibuprofen (ADVIL) 600 MG tablet SMARTSIG:1 Tablet(s) By Mouth Every 12 Hours   JANUVIA 100 MG tablet Take 100 mg by mouth daily. (Patient not taking: Reported on 12/25/2021)   MAGNESIUM ASPARTATE PO Take 600 mg by mouth daily.   metFORMIN (GLUCOPHAGE-XR) 750 MG 24 hr tablet TAKE 1 TABLET BY MOUTH DAILY FOR 4 TO 5 DAYS, THEN 1 TABLET TWICE DAILY IF NO SIDE EFFECTS   omeprazole (PRILOSEC) 20 MG capsule Take 20 mg by mouth daily   Potassium Gluconate 550 MG TABS Take by mouth every other day.   pramipexole (MIRAPEX) 0.5 MG tablet Take 1.5 mg by mouth 90 minutes before bedtime   rosuvastatin (CRESTOR) 10 MG tablet Take 10 mg daily by mouth.   SUMAtriptan (IMITREX) 100 MG tablet Take 100 mg by mouth every 2 (two) hours as needed for migraine or headache.   traMADol (ULTRAM) 50 MG tablet TAKE 1 TABLET(50 MG) BY MOUTH AT BEDTIME AS NEEDED   valACYclovir (VALTREX) 1000 MG tablet Take 1,000 mg by mouth daily as needed (for fever blisters).   vitamin B-12 (CYANOCOBALAMIN) 1000 MCG tablet Take 1,000 mcg by mouth daily.   Vitamin D-Vitamin K (VITAMIN D2 + K1 PO) Take by mouth daily.   zolpidem (AMBIEN) 5 MG tablet TAKE 1 TABLET BY MOUTH AT BEDTIME AS NEEDED FOR SLEEP   [DISCONTINUED] metFORMIN (GLUCOPHAGE-XR) 750 MG 24 hr tablet Take 1 tablet by mouth 2 (two) times daily.   [DISCONTINUED] omeprazole (  PRILOSEC) 20 MG capsule Take by mouth.   [DISCONTINUED] omeprazole (PRILOSEC) 20 MG capsule Take by mouth.   [DISCONTINUED] TRULICITY 0.75 MG/0.5ML SOPN Inject into the skin.   Facility-Administered Encounter Medications as of 08/24/2022  Medication   triamcinolone acetonide (KENALOG) 10 MG/ML injection 10 mg    Surgical History: Past Surgical History:  Procedure Laterality Date   CARPAL TUNNEL RELEASE Left 12/18/2015   Procedure: CARPAL TUNNEL RELEASE;  Surgeon: Erin Sons, MD;  Location: [facility] ORS;  Service: Orthopedics;  Laterality:  Left;   CARPAL TUNNEL RELEASE Right 2018   CATARACT EXTRACTION W/PHACO Right 11/13/2015   Procedure: CATARACT EXTRACTION PHACO AND INTRAOCULAR LENS PLACEMENT (IOC);  Surgeon: Sallee Lange, MD;  Location: [facility] ORS;  Service: Ophthalmology;  Laterality: Right;  [place] 01:08AP% 21.6CDE 26.99Fluid pack lot # [id number] H   CATARACT EXTRACTION W/PHACO Left 12/04/2015   Procedure: CATARACT EXTRACTION PHACO AND INTRAOCULAR LENS PLACEMENT (IOC);  Surgeon: Sallee Lange, MD;  Location: [facility] ORS;  Service: Ophthalmology;  Laterality: Left;  [place] 59.2 AP% 24.4 CDE 29.2 Fluid Pack lot # [id number]   CERVICAL DISC SURGERY     cervical decompression c4-5 and c5-6   COLONOSCOPY W/ BIOPSIES AND POLYPECTOMY     CTR     right hand   EYE SURGERY Bilateral    cataract    LUMBAR SPINE SURGERY  2018   SHOULDER SURGERY     right shoulder   TRIGGER FINGER RELEASE     right finger   WISDOM TOOTH EXTRACTION      Medical History: Past Medical History:  Diagnosis Date   Arthritis    Broken ankle 11/2013   right ankle   Cataract    Bil/ surgery on right eye   Diabetes mellitus without complication (HCC)    Fatty liver    Fibromyalgia 2005   GERD (gastroesophageal reflux disease)    uses prilosec   Headache(784.0)    not as often as before   Hypercholesteremia    Hypertension    Hyperthyroidism    OSA (obstructive sleep apnea)    CPAP   Plantar fasciitis    Post-operative nausea and vomiting    Restless leg syndrome    Schatzki's ring    Spondylolisthesis of lumbar region    Stress incontinence    Stroke (HCC) 2015   TIA.  unable to determine if this is true.   Thyroiditis    Vertigo     Family History: Non contributory to the present illness  Social History: Social History   Socioeconomic History   Marital status: Married    Spouse name: Not on file   Number of children: 0   Years of education: Not on file   Highest education level: Not on file  Occupational History   Occupation:  Immunologist     Employer: [employer]  Tobacco Use   Smoking status: Never    Passive exposure: Past   Smokeless tobacco: Never  Vaping Use   Vaping Use: Never used  Substance and Sexual Activity   Alcohol use: Yes    Comment: OCCAS   Drug use: No   Sexual activity: Yes    Partners: Female  Other Topics Concern   Not on file  Social History Narrative   Married to [place], no natural children   Right handed   Bachelor's degree   2-3 cups daily         Social Determinants of Health   Financial Resource Strain: Not on file  Food Insecurity: Not on file  Transportation Needs: Not on file  Physical Activity: Not on file  Stress: Not on file  Social Connections: Not on file  Intimate Partner Violence: Not on file    Vital Signs: Blood pressure (!) 144/78, pulse 89, resp. rate 14, height 5\' 1"  (1.549 m), weight 210 lb (95.3 kg), SpO2 96 %. Body mass index is 39.68 kg/m.    Examination: General Appearance: The patient is well-developed, well-nourished, and in no distress. Neck Circumference: 45 cm Skin: Gross inspection of skin unremarkable. Head: normocephalic, no gross deformities. Eyes: no gross deformities noted. ENT: ears appear grossly normal Neurologic: Alert and oriented. No involuntary movements.  STOP BANG RISK ASSESSMENT S (snore) Have you been told that you snore?     NO   T (tired) Are you often tired, fatigued, or sleepy during the day?   YES  O (obstruction) Do you stop breathing, choke, or gasp during sleep? NO   P (pressure) Do you have or are you being treated for high blood pressure? YES   B (BMI) Is your body index greater than 35 kg/m? YES   A (age) Are you 30 years old or older? YES   N (neck) Do you have a neck circumference greater than 16 inches?   YES   G (gender) Are you a female? NO   TOTAL STOP/BANG "YES" ANSWERS 5       A STOP-Bang score of 2 or less is considered low risk, and a score of 5 or more is high risk for  having either moderate or severe OSA. For people who score 3 or 4, doctors may need to perform further assessment to determine how likely they are to have OSA.         EPWORTH SLEEPINESS SCALE:  Scale:  (0)= no chance of dozing; (1)= slight chance of dozing; (2)= moderate chance of dozing; (3)= high chance of dozing  Chance  Situtation    Sitting and reading: 3    Watching TV: 3    Sitting Inactive in public: 3    As a passenger in car: 3      Lying down to rest: 3    Sitting and talking: 1    Sitting quielty after lunch: 3    In a car, stopped in traffic: 0   TOTAL SCORE:   19 out of 24    SLEEP STUDIES:  SPLIT (08/03/13) AHI 42.8, min SPO2 88%   CPAP COMPLIANCE DATA:  Date Range: 08/20/21 - 08/19/22  Average Daily Use: 6 hours  Median Use: 6 hours 24 minutes  Compliance for > 4 Hours: 311 days  AHI: 2.6 respiratory events per hour  Days Used: 351/365  Mask Leak: 4.1  95th Percentile Pressure: 18.9 cmh20         LABS: Recent Results (from the past 2160 hour(s))  DRUG MONITOR, TRAMADOL,QN, URINE     Status: Abnormal   Collection Time: 06/25/22  1:28 PM  Result Value Ref Range   Desmethyltramadol 1,884 (H) <100 ng/mL   Tramadol >10,000 (H) <100 ng/mL   Tramadol Comments      Comment: See Tramadol Notes, LDT Notes  DRUG MONITOR, PANEL 5, W/CONF, URINE     Status: None   Collection Time: 06/25/22  1:28 PM  Result Value Ref Range   Amphetamines NEGATIVE <500 ng/mL   Barbiturates NEGATIVE <300 ng/mL   Benzodiazepines NEGATIVE <100 ng/mL   Cocaine Metabolite NEGATIVE <150 ng/mL   Marijuana Metabolite  NEGATIVE <20 ng/mL   Methadone Metabolite NEGATIVE <100 ng/mL   Opiates NEGATIVE <100 ng/mL   Oxycodone NEGATIVE <100 ng/mL   Creatinine 47.1 > or = 20.0 mg/dL   pH 5.8 4.5 - 9.0   Oxidant NEGATIVE <200 mcg/mL  DM TEMPLATE     Status: None   Collection Time: 06/25/22  1:28 PM  Result Value Ref Range   Notes and Comments      Comment: This  drug testing is for medical treatment only. Analysis was performed as non-forensic testing and these results should be used only by healthcare providers to render diagnosis or treatment, or to monitor progress of medical conditions. . Tramadol Notes: Tramadol, Desmethyltramadol detected is consistent with  the use of the drug Tramadol. Marland Kitchen LDT Notes: Confirmation tests were developed and their analytical  performance characteristics have been determined by  [employer]. It has not been cleared or approved  by the FDA. This assay has been validated pursuant to  the CLIA regulations and is used for clinical purposes. . . Healthcare Providers needing Interpretation assistance,  please contact us at [phone] ([phone])  M-F, 8am to 10pm EST     Radiology: MM 3D SCREENING MAMMOGRAM BILATERAL BREAST  Result Date: 07/08/2022 CLINICAL DATA:  Screening. EXAM: DIGITAL SCREENING BILATERAL MAMMOGRAM WITH TOMOSYNTHESIS AND CAD TECHNIQUE: Bilateral screening digital craniocaudal and mediolateral oblique mammograms were obtained. Bilateral screening digital breast tomosynthesis was performed. The images were evaluated with computer-aided detection. COMPARISON:  Previous exam(s). ACR Breast Density Category b: There are scattered areas of fibroglandular density. FINDINGS: There are no findings suspicious for malignancy. IMPRESSION: No mammographic evidence of malignancy. A result letter of this screening mammogram will be mailed directly to the patient. RECOMMENDATION: Screening mammogram in one year. (Code:SM-B-01Y) BI-RADS CATEGORY  1: Negative. Electronically Signed   By: Edwin Cap M.D.   On: 07/08/2022 08:38    No results found.  No results found.    Assessment and Plan: Patient Active Problem List   Diagnosis Date Noted   OSA on CPAP 01/27/2021   Diabetes mellitus without complication (HCC) 06/13/2018   Primary osteoarthritis of both hands 08/30/2017   Primary  osteoarthritis of both knees 08/30/2017   DDD (degenerative disc disease), cervical 08/30/2017   DDD (degenerative disc disease), lumbar 08/30/2017   History of insomnia 08/30/2017   Fatty liver 08/30/2017   History of TIA (transient ischemic attack) 08/30/2017   S/p bilateral carpal tunnel release 08/30/2017   History of gastroesophageal reflux (GERD) 08/30/2017   History of vitamin D deficiency 08/30/2017   Spondylolisthesis of lumbar region 01/12/2017   Benign essential hypertension 05/06/2016   Oral herpes 05/06/2016   Carpal tunnel syndrome of left wrist 11/08/2015   Hyperlipidemia, unspecified 05/10/2015   Osteopenia 11/05/2014   Closed fracture of fibula 01/09/2014   Sleep apnea 01/06/2011   Restless leg syndrome 01/06/2011   Insomnia 01/06/2011   Fibromyalgia 01/06/2011   GERD (gastroesophageal reflux disease) 12/15/2007   1. OSA on CPAP The patient does tolerate PAP and reports  benefit from PAP use. The patient was reminded how to clean equipment and advised to replace supplies routinely. The patient was also counselled on trying to sleep longer. The compliance is good. The AHI is 2.6.   OSA on cpap- controlled. Continue with good  compliance with pap. CPAP continues to be medically necessary to treat this patient's OSA. F/u one year.     2. CPAP use counseling CPAP Counseling: had a lengthy discussion with the patient  regarding the importance of PAP therapy in management of the sleep apnea. Patient appears to understand the risk factor reduction and also understands the risks associated with untreated sleep apnea. Patient will try to make a good faith effort to remain compliant with therapy. Also instructed the patient on proper cleaning of the device including the water must be changed daily if possible and use of distilled water is preferred. Patient understands that the machine should be regularly cleaned with appropriate recommended cleaning solutions that do not damage  the PAP machine for example given white vinegar and water rinses. Other methods such as ozone treatment may not be as good as these simple methods to achieve cleaning.   3. Benign essential hypertension Hypertension Counseling:   The following hypertensive lifestyle modification were recommended and discussed:  1. Limiting alcohol intake to less than 1 oz/day of ethanol:(24 oz of beer or 8 oz of wine or 2 oz of 100-proof whiskey). 2. Take baby ASA 81 mg daily. 3. Importance of regular aerobic exercise and losing weight. 4. Reduce dietary saturated fat and cholesterol intake for overall cardiovascular health. 5. Maintaining adequate dietary potassium, calcium, and magnesium intake. 6. Regular monitoring of the blood pressure. 7. Reduce sodium intake to less than 100 mmol/day (less than 2.3 gm of sodium or less than 6 gm of sodium choride)    4. Obesity (BMI 30-39.9) Obesity Counseling: Had a lengthy discussion regarding patients BMI and weight issues. Patient was instructed on portion control as well as increased activity. Also discussed caloric restrictions with trying to maintain intake less than 2000 Kcal. Discussions were made in accordance with the 5As of weight management. Simple actions such as not eating late and if able to, taking a walk is suggested.      General Counseling: I have discussed the findings of the evaluation and examination with Dois Davenport.  I have also discussed any further diagnostic evaluation thatmay be needed or ordered today. Kaitlynn verbalizes understanding of the findings of todays visit. We also reviewed her medications today and discussed drug interactions and side effects including but not limited excessive drowsiness and altered mental states. We also discussed that there is always a risk not just to her but also people around her. she has been encouraged to call the office with any questions or concerns that should arise related to todays visit.  No orders of the  defined types were placed in this encounter.       I have personally obtained a history, examined the patient, evaluated laboratory and imaging results, formulated the assessment and plan and placed orders. This patient was seen today by Emmaline Kluver, PA-C in collaboration with Dr. Freda Munro.   Yevonne Pax, MD [facility] Diplomate ABMS Pulmonary Critical Care Medicine and Sleep Medicine

## 2022-08-24 NOTE — Patient Instructions (Signed)

## 2022-08-28 ENCOUNTER — Other Ambulatory Visit: Payer: Self-pay | Admitting: Rheumatology

## 2022-08-28 ENCOUNTER — Other Ambulatory Visit: Payer: Self-pay | Admitting: Physician Assistant

## 2022-08-31 NOTE — Telephone Encounter (Signed)
Last Fill: 07/30/2022  UDS:06/25/2022 c/w  Narc Agreement: 06/25/2022  Next Visit: 11/17/2022  Last Visit: 06/25/2022  Dx: DDD (degenerative disc disease), lumbar:   Current Dose per office note on on 06/25/2022:tramadol 50 mg 1 tablet daily for pain relief.   Okay to refill tramadol?

## 2022-08-31 NOTE — Telephone Encounter (Signed)
Last Fill: 08/03/2022  Next Visit: 06/25/2022  Last Visit: 11/17/2022  Dx: Fibromyalgia  Current Dose per office note on on 06/25/2022:Ambien 5 mg at bedtime    Okay to refill ambien?

## 2022-09-01 ENCOUNTER — Ambulatory Visit (AMBULATORY_SURGERY_CENTER): Payer: Medicare Other

## 2022-09-01 VITALS — Ht 61.0 in | Wt 216.0 lb

## 2022-09-01 DIAGNOSIS — Z86010 Personal history of colonic polyps: Secondary | ICD-10-CM

## 2022-09-01 DIAGNOSIS — Z83719 Family history of colon polyps, unspecified: Secondary | ICD-10-CM

## 2022-09-01 MED ORDER — NA SULFATE-K SULFATE-MG SULF 17.5-3.13-1.6 GM/177ML PO SOLN: 1.0000 | Freq: Once | ORAL | 0 refills | Status: AC

## 2022-09-01 NOTE — Progress Notes (Signed)
No egg or soy allergy known to patient  No issues known to pt with past sedation with any surgeries or procedures Patient denies ever being told they had issues or difficulty with intubation  No FH of Malignant Hyperthermia Pt is not on home 02  Pt is not on blood thinners  Pt denies issues with constipation  No A fib or A flutter Have any cardiac testing pending--no Patient's chart reviewed by Cathlyn Parsons CNRA prior to previsit and patient appropriate for the [facility].  Previsit completed and red dot placed by patient's name on their procedure day (on provider's schedule).    Ambulates independently Pt instructed to use Singlecare.com or GoodRx for a price reduction on prep

## 2022-09-04 ENCOUNTER — Other Ambulatory Visit: Payer: Self-pay | Admitting: Neurological Surgery

## 2022-09-04 DIAGNOSIS — M4316 Spondylolisthesis, lumbar region: Secondary | ICD-10-CM

## 2022-09-08 ENCOUNTER — Ambulatory Visit
Admission: RE | Admit: 2022-09-08 | Discharge: 2022-09-08 | Disposition: A | Discharge status: Home / Self Care | Payer: Medicare Other | Source: Ambulatory Visit | Attending: Neurological Surgery | Admitting: Neurological Surgery

## 2022-09-08 DIAGNOSIS — M4316 Spondylolisthesis, lumbar region: Secondary | ICD-10-CM | POA: Insufficient documentation

## 2022-09-15 ENCOUNTER — Ambulatory Visit: Payer: Medicare Other | Admitting: Internal Medicine

## 2022-09-15 ENCOUNTER — Encounter: Payer: Self-pay | Admitting: Internal Medicine

## 2022-09-15 VITALS — BP 128/69 | HR 71 | Temp 98.1°F | Resp 13 | Ht 61.0 in | Wt 216.0 lb

## 2022-09-15 DIAGNOSIS — Z86010 Personal history of colonic polyps: Secondary | ICD-10-CM | POA: Diagnosis not present

## 2022-09-15 DIAGNOSIS — Z09 Encounter for follow-up examination after completed treatment for conditions other than malignant neoplasm: Secondary | ICD-10-CM

## 2022-09-15 DIAGNOSIS — Z83719 Family history of colon polyps, unspecified: Secondary | ICD-10-CM

## 2022-09-15 MED ORDER — SODIUM CHLORIDE 0.9 % IV SOLN
500.0000 mL | Freq: Once | INTRAVENOUS | Status: DC
Start: 2022-09-15 — End: 2022-09-15

## 2022-09-15 NOTE — Progress Notes (Signed)
Pt's states no medical or surgical changes since previsit or office visit. 

## 2022-09-15 NOTE — Progress Notes (Signed)
A and O x3. Report to RN. Tolerated MAC anesthesia well.

## 2022-09-15 NOTE — Patient Instructions (Signed)
YOU HAD AN ENDOSCOPIC PROCEDURE TODAY AT THE [facility]:   Refer to the procedure report that was given to you for any specific questions about what was found during the examination.  If the procedure report does not answer your questions, please call your gastroenterologist to clarify.  If you requested that your care partner not be given the details of your procedure findings, then the procedure report has been included in a sealed envelope for you to review at your convenience later.  YOU SHOULD EXPECT: Some feelings of bloating in the abdomen. Passage of more gas than usual.  Walking can help get rid of the air that was put into your GI tract during the procedure and reduce the bloating. If you had a lower endoscopy (such as a colonoscopy or flexible sigmoidoscopy) you may notice spotting of blood in your stool or on the toilet paper. If you underwent a bowel prep for your procedure, you may not have a normal bowel movement for a few days.  Please Note:  You might notice some irritation and congestion in your nose or some drainage.  This is from the oxygen used during your procedure.  There is no need for concern and it should clear up in a day or so.  SYMPTOMS TO REPORT IMMEDIATELY:  Following lower endoscopy (colonoscopy or flexible sigmoidoscopy):  Excessive amounts of blood in the stool  Significant tenderness or worsening of abdominal pains  Swelling of the abdomen that is new, acute  Fever of 100F or higher  For urgent or emergent issues, a gastroenterologist can be reached at any hour by calling [phone]. Do not use MyChart messaging for urgent concerns.    DIET:  We do recommend a small meal at first, but then you may proceed to your regular diet.  Drink plenty of fluids but you should avoid alcoholic beverages for 24 hours.  ACTIVITY:  You should plan to take it easy for the rest of today and you should NOT DRIVE or use heavy machinery until tomorrow (because of  the sedation medicines used during the test).    FOLLOW UP: Our staff will call the number listed on your records the next business day following your procedure.  We will call around 7:15- 8:00 am to check on you and address any questions or concerns that you may have regarding the information given to you following your procedure. If we do not reach you, we will leave a message.      SIGNATURES/CONFIDENTIALITY: You and/or your care partner have signed paperwork which will be entered into your electronic medical record.  These signatures attest to the fact that that the information above on your After Visit Summary has been reviewed and is understood.  Full responsibility of the confidentiality of this discharge information lies with you and/or your care-partner. 

## 2022-09-15 NOTE — Progress Notes (Signed)
HISTORY OF PRESENT ILLNESS:  Carolyn Graham is a 67 y.o. female with history of adenomatous colon polyps and family history of colon cancer in her mother and grandfather.  Now for surveillance colonoscopy  REVIEW OF SYSTEMS:  All non-GI ROS negative except for  Past Medical History:  Diagnosis Date   Allergy    Arthritis    Broken ankle 11/2013   right ankle   Cataract    Bil/ surgery on right eye   Diabetes mellitus without complication (HCC)    Fatty liver    Fibromyalgia 2005   GERD (gastroesophageal reflux disease)    uses prilosec   Headache(784.0)    not as often as before   Hypercholesteremia    Hypertension    Hyperthyroidism    OSA (obstructive sleep apnea)    CPAP   Plantar fasciitis    Post-operative nausea and vomiting    Restless leg syndrome    Schatzki's ring    Sleep apnea    Spondylolisthesis of lumbar region    Stress incontinence    Stroke (HCC) 2015   TIA.  unable to determine if this is true.   Thyroiditis    Vertigo     Past Surgical History:  Procedure Laterality Date   CARPAL TUNNEL RELEASE Left 12/18/2015   Procedure: CARPAL TUNNEL RELEASE;  Surgeon: Erin Sons, MD;  Location: [facility] ORS;  Service: Orthopedics;  Laterality: Left;   CARPAL TUNNEL RELEASE Right 2018   CATARACT EXTRACTION W/PHACO Right 11/13/2015   Procedure: CATARACT EXTRACTION PHACO AND INTRAOCULAR LENS PLACEMENT (IOC);  Surgeon: Sallee Lange, MD;  Location: [facility] ORS;  Service: Ophthalmology;  Laterality: Right;  [place] 01:08AP% 21.6CDE 26.99Fluid pack lot # [id number]   CATARACT EXTRACTION W/PHACO Left 12/04/2015   Procedure: CATARACT EXTRACTION PHACO AND INTRAOCULAR LENS PLACEMENT (IOC);  Surgeon: Sallee Lange, MD;  Location: [facility] ORS;  Service: Ophthalmology;  Laterality: Left;  [place] 59.2 AP% 24.4 CDE 29.2 Fluid Pack lot # [id number]   CERVICAL DISC SURGERY     cervical decompression c4-5 and c5-6   COLONOSCOPY W/ BIOPSIES AND POLYPECTOMY     CTR     right hand    EYE SURGERY Bilateral    cataract    LUMBAR SPINE SURGERY  2018   SHOULDER SURGERY     right shoulder   TRIGGER FINGER RELEASE     right finger   VAGINA SURGERY  04/2022   WISDOM TOOTH EXTRACTION      Social History Carolyn Graham  reports that she has never smoked. She has been exposed to tobacco smoke. She has never used smokeless tobacco. She reports current alcohol use. She reports that she does not use drugs.  family history includes Colon cancer in her maternal grandfather; Colon cancer (age of onset: 83) in her mother; Colon polyps in her maternal aunt; Lung cancer (age of onset: 23) in her father.  Allergies  Allergen Reactions   Codeine Nausea And Vomiting   Darvocet [Propoxyphene N-Acetaminophen] Nausea Only       PHYSICAL EXAMINATION: Vital signs: BP (!) 143/121   Pulse 72   Temp 98.1 F (36.7 C)   Resp 16   Ht 5\' 1"  (1.549 m)   Wt 216 lb (98 kg)   SpO2 100%   BMI 40.81 kg/m  General: Well-developed, well-nourished, no acute distress HEENT: Sclerae are anicteric, conjunctiva pink. Oral mucosa intact Lungs: Clear Heart: Regular Abdomen: soft, nontender, nondistended, no obvious ascites, no peritoneal signs, normal bowel sounds. No  organomegaly. Extremities: No edema Psychiatric: alert and oriented x3. Cooperative     ASSESSMENT:  Personal history of adenomatous polyps and family history of colon cancer   PLAN: Surveillance colonoscopy

## 2022-09-15 NOTE — Op Note (Signed)
[facility] Patient Name: Carolyn Graham Procedure Date: 09/15/2022 7:59 AM MRN: [id number] Endoscopist: Wilhemina Bonito. Marina Goodell , MD, [id number] Age: 67 Referring MD:  Date of Birth: 06-10-1955 Gender: Female Account #: [id number] Procedure:                Colonoscopy Indications:              High risk colon cancer surveillance: Personal                            history of multiple adenomas. Family history of                            colon cancer in mother and grandfather. Previous                            examinations 2009, 2017 Medicines:                Monitored Anesthesia Care Procedure:                Pre-Anesthesia Assessment:                           - Prior to the procedure, a History and Physical                            was performed, and patient medications and                            allergies were reviewed. The patient's tolerance of                            previous anesthesia was also reviewed. The risks                            and benefits of the procedure and the sedation                            options and risks were discussed with the patient.                            All questions were answered, and informed consent                            was obtained. Prior Anticoagulants: The patient has                            taken no anticoagulant or antiplatelet agents. ASA                            Grade Assessment: III - A patient with severe                            systemic disease. After reviewing the risks and  benefits, the patient was deemed in satisfactory                            condition to undergo the procedure.                           After obtaining informed consent, the colonoscope                            was passed under direct vision. Throughout the                            procedure, the patient's blood pressure, pulse, and                            oxygen saturations were monitored  continuously. The                            CF [id number] #[id number] was introduced through the anus                            and advanced to the the cecum, identified by                            appendiceal orifice and ileocecal valve. The                            ileocecal valve, appendiceal orifice, and rectum                            were photographed. The quality of the bowel                            preparation was excellent. The colonoscopy was                            performed without difficulty. The patient tolerated                            the procedure well. The bowel preparation used was                            SUPREP via split dose instruction. Scope In: 8:17:35 AM Scope Out: 8:31:20 AM Scope Withdrawal Time: 0 hours 10 minutes 25 seconds  Total Procedure Duration: 0 hours 13 minutes 45 seconds  Findings:                 Internal hemorrhoids were found during                            retroflexion. The hemorrhoids were small.                           The entire examined colon appeared normal on direct  and retroflexion views. Complications:            No immediate complications. Estimated blood loss:                            None. Estimated Blood Loss:     Estimated blood loss: none. Impression:               - Internal hemorrhoids.                           - The entire examined colon is normal on direct and                            retroflexion views.                           - No specimens collected. Recommendation:           - Repeat colonoscopy in 5 years for surveillance.                           - Patient has a contact number available for                            emergencies. The signs and symptoms of potential                            delayed complications were discussed with the                            patient. Return to normal activities tomorrow.                            Written discharge instructions were  provided to the                            patient.                           - Resume previous diet.                           - Continue present medications. Wilhemina Bonito. Marina Goodell, MD 09/15/2022 8:38:21 AM This report has been signed electronically.

## 2022-09-16 ENCOUNTER — Telehealth: Payer: Self-pay

## 2022-09-16 NOTE — Telephone Encounter (Signed)
  Follow up Call-     09/15/2022    7:35 AM  Call back number  Post procedure Call Back phone  # [phone]  Permission to leave phone message Yes     Patient questions:  Do you have a fever, pain , or abdominal swelling? No. Pain Score  0 *  Have you tolerated food without any problems? Yes.    Have you been able to return to your normal activities? Yes.    Do you have any questions about your discharge instructions: Diet   No. Medications  No. Follow up visit  No.  Do you have questions or concerns about your Care? No.  Actions: * If pain score is 4 or above: No action needed, pain <4.

## 2022-09-28 ENCOUNTER — Other Ambulatory Visit: Payer: Self-pay | Admitting: Neurological Surgery

## 2022-09-30 ENCOUNTER — Other Ambulatory Visit: Payer: Self-pay | Admitting: Rheumatology

## 2022-09-30 MED ORDER — TRAMADOL HCL 50 MG PO TABS: ORAL_TABLET | ORAL | 0 refills | Status: DC

## 2022-09-30 MED ORDER — ZOLPIDEM TARTRATE 5 MG PO TABS: ORAL_TABLET | ORAL | 0 refills | Status: DC

## 2022-09-30 NOTE — Telephone Encounter (Signed)
Last Fill: 08/31/2022  UDS:06/25/2022 c/w   Narc Agreement: 06/25/2022   Next Visit: 11/17/2022  Last Visit: 06/25/2022  Dx:  DDD (degenerative disc disease), lumbar   Current Dose per office note on 06/25/2022:tramadol 50 mg 1 tablet daily for pain relief.    Okay to refill Tramadol?

## 2022-09-30 NOTE — Telephone Encounter (Signed)
Last Fill: 08/31/2022   Next Visit: 06/25/2022   Last Visit: 11/17/2022   Dx: Fibromyalgia   Current Dose per office note on on 06/25/2022:Ambien 5 mg at bedtime      Okay to refill ambien?

## 2022-10-07 NOTE — Progress Notes (Signed)
Surgical Instructions    Your procedure is scheduled on Thursday October 22, 2022.  Report to [facility] Main Entrance "A" at 5:30 A.M., then check in with the Admitting office.  Call this number if you have problems the morning of surgery:  [phone]   If you have any questions prior to your surgery date call [phone]: Open Monday-Friday 8am-4pm If you experience any cold or flu symptoms such as cough, fever, chills, shortness of breath, etc. between now and your scheduled surgery, please notify us at the above number     Remember:  Do not eat or drink after midnight the night before your surgery    Take these medicines the morning of surgery with A SIP OF WATER:  alendronate (FOSAMAX)  DULoxetine (CYMBALTA)  omeprazole (PRILOSEC)  rosuvastatin (CRESTOR)   If needed: SUMAtriptan (IMITREX)  valACYclovir (VALTREX)   As of today, STOP taking any Aspirin (unless otherwise instructed by your surgeon) Aleve, Naproxen, Ibuprofen, Motrin, Advil, Goody's, BC's, all herbal medications, fish oil, and all vitamins. This includes VOLTAREN GEL .   WHAT DO I DO ABOUT MY DIABETES MEDICATION?  Stop taking Dulaglutide (TRULICITY) 7 days prior to surgery. Last dose on _____.  Do not take oral diabetes medicines (pills) the morning of surgery. This includes metFORMIN (GLUCOPHAGE-XR).   The day of surgery, do not take other diabetes injectables, including Byetta (exenatide), Bydureon (exenatide ER), Victoza (liraglutide), or Trulicity (dulaglutide).  If your CBG is greater than 220 mg/dL, you may take  of your sliding scale (correction) dose of insulin.   HOW TO MANAGE YOUR DIABETES BEFORE AND AFTER SURGERY  Why is it important to control my blood sugar before and after surgery? Improving blood sugar levels before and after surgery helps healing and can limit problems. A way of improving blood sugar control is eating a healthy diet by:  Eating less sugar and carbohydrates  Increasing  activity/exercise  Talking with your doctor about reaching your blood sugar goals High blood sugars (greater than 180 mg/dL) can raise your risk of infections and slow your recovery, so you will need to focus on controlling your diabetes during the weeks before surgery. Make sure that the doctor who takes care of your diabetes knows about your planned surgery including the date and location.  How do I manage my blood sugar before surgery? Check your blood sugar at least 4 times a day, starting 2 days before surgery, to make sure that the level is not too high or low.  Check your blood sugar the morning of your surgery when you wake up and every 2 hours until you get to the Short Stay unit.  If your blood sugar is less than 70 mg/dL, you will need to treat for low blood sugar: Do not take insulin. Treat a low blood sugar (less than 70 mg/dL) with  cup of clear juice (cranberry or apple), 4 glucose tablets, OR glucose gel. Recheck blood sugar in 15 minutes after treatment (to make sure it is greater than 70 mg/dL). If your blood sugar is not greater than 70 mg/dL on recheck, call [phone] for further instructions. Report your blood sugar to the short stay nurse when you get to Short Stay.  If you are admitted to the hospital after surgery: Your blood sugar will be checked by the staff and you will probably be given insulin after surgery (instead of oral diabetes medicines) to make sure you have good blood sugar levels. The goal for blood sugar control after  surgery is 80-180 mg/dL.   Special instructions:    Oral Hygiene is also important to reduce your risk of infection.  Remember - BRUSH YOUR TEETH THE MORNING OF SURGERY WITH YOUR REGULAR TOOTHPASTE    Pre-operative 5 CHG Bath Instructions   You can play a key role in reducing the risk of infection after surgery. Your skin needs to be as free of germs as possible. You can reduce the number of germs on your skin by washing with CHG  (chlorhexidine gluconate) soap before surgery. CHG is an antiseptic soap that kills germs and continues to kill germs even after washing.   DO NOT use if you have an allergy to chlorhexidine/CHG or antibacterial soaps. If your skin becomes reddened or irritated, stop using the CHG and notify one of our RNs at [phone].   Please shower with the CHG soap starting 4 days before surgery using the following schedule:     Please keep in mind the following:  DO NOT shave, including legs and underarms, starting the day of your first shower.   You may shave your face at any point before/day of surgery.  Place clean sheets on your bed the day you start using CHG soap. Use a clean washcloth (not used since being washed) for each shower. DO NOT sleep with pets once you start using the CHG.   CHG Shower Instructions:  If you choose to wash your hair and private area, wash first with your normal shampoo/soap.  After you use shampoo/soap, rinse your hair and body thoroughly to remove shampoo/soap residue.  Turn the water OFF and apply about 3 tablespoons (45 ml) of CHG soap to a CLEAN washcloth.  Apply CHG soap ONLY FROM YOUR NECK DOWN TO YOUR TOES (washing for 3-5 minutes)  DO NOT use CHG soap on face, private areas, open wounds, or sores.  Pay special attention to the area where your surgery is being performed.  If you are having back surgery, having someone wash your back for you may be helpful. Wait 2 minutes after CHG soap is applied, then you may rinse off the CHG soap.  Pat dry with a clean towel  Put on clean clothes/pajamas   If you choose to wear lotion, please use ONLY the CHG-compatible lotions on the back of this paper.     Additional instructions for the day of surgery: DO NOT APPLY any lotions, deodorants, cologne, or perfumes.   Put on clean/comfortable clothes.  Brush your teeth.  Ask your nurse before applying any prescription medications to the skin.      CHG Compatible  Lotions   Aveeno Moisturizing lotion  Cetaphil Moisturizing Cream  Cetaphil Moisturizing Lotion  Clairol Herbal Essence Moisturizing Lotion, Dry Skin  Clairol Herbal Essence Moisturizing Lotion, Extra Dry Skin  Clairol Herbal Essence Moisturizing Lotion, Normal Skin  Curel Age Defying Therapeutic Moisturizing Lotion with Alpha Hydroxy  Curel Extreme Care Body Lotion  Curel Soothing Hands Moisturizing Hand Lotion  Curel Therapeutic Moisturizing Cream, Fragrance-Free  Curel Therapeutic Moisturizing Lotion, Fragrance-Free  Curel Therapeutic Moisturizing Lotion, Original Formula  Eucerin Daily Replenishing Lotion  Eucerin Dry Skin Therapy Plus Alpha Hydroxy Crme  Eucerin Dry Skin Therapy Plus Alpha Hydroxy Lotion  Eucerin Original Crme  Eucerin Original Lotion  Eucerin Plus Crme Eucerin Plus Lotion  Eucerin TriLipid Replenishing Lotion  Keri Anti-Bacterial Hand Lotion  Keri Deep Conditioning Original Lotion Dry Skin Formula Softly Scented  Keri Deep Conditioning Original Lotion, Fragrance Free Sensitive Skin Formula  Keri  Lotion Fast Absorbing Fragrance Free Sensitive Skin Formula  Keri Lotion Fast Absorbing Softly Scented Dry Skin Formula  Keri Original Lotion  Keri Skin Renewal Lotion Keri Silky Smooth Lotion  Keri Silky Smooth Sensitive Skin Lotion  Nivea Body Creamy Conditioning Oil  Nivea Body Extra Enriched Teacher, adult education Moisturizing Lotion Nivea Crme  Nivea Skin Firming Lotion  NutraDerm 30 Skin Lotion  NutraDerm Skin Lotion  NutraDerm Therapeutic Skin Cream  NutraDerm Therapeutic Skin Lotion  ProShield Protective Hand Cream  Provon moisturizing lotion    is not responsible for any belongings or valuables.    Do NOT Smoke (Tobacco/Vaping)  24 hours prior to your procedure  If you use a CPAP at night, you may bring your mask for your overnight stay.   Contacts, glasses, hearing aids, dentures or partials may not  be worn into surgery, please bring cases for these belongings   For patients admitted to the hospital, discharge time will be determined by your treatment team.   Patients discharged the day of surgery will not be allowed to drive home, and someone needs to stay with them for 24 hours.   SURGICAL WAITING ROOM VISITATION Patients having surgery or a procedure may have no more than 2 support people in the waiting area - these visitors may rotate.   Children under the age of 69 must have an adult with them who is not the patient. If the patient needs to stay at the hospital during part of their recovery, the visitor guidelines for inpatient rooms apply. Pre-op nurse will coordinate an appropriate time for 1 support person to accompany patient in pre-op.  This support person may not rotate.   Please refer to [email or web address] for the visitor guidelines for Inpatients (after your surgery is over and you are in a regular room).    If you received a COVID test during your pre-op visit, it is requested that you wear a mask when out in public, stay away from anyone that may not be feeling well, and notify your surgeon if you develop symptoms. If you have been in contact with anyone that has tested positive in the last 10 days, please notify your surgeon.    Please read over the following fact sheets that you were given.

## 2022-10-08 ENCOUNTER — Other Ambulatory Visit: Payer: Self-pay

## 2022-10-08 ENCOUNTER — Encounter (HOSPITAL_COMMUNITY): Payer: Self-pay

## 2022-10-08 ENCOUNTER — Encounter (HOSPITAL_COMMUNITY)
Admission: RE | Admit: 2022-10-08 | Discharge: 2022-10-08 | Disposition: A | Discharge status: Home / Self Care | Payer: Medicare Other | Source: Ambulatory Visit | Attending: Neurological Surgery | Admitting: Neurological Surgery

## 2022-10-08 VITALS — BP 146/75 | HR 88 | Temp 98.2°F | Resp 17 | Ht 61.0 in | Wt 208.7 lb

## 2022-10-08 DIAGNOSIS — I10 Essential (primary) hypertension: Secondary | ICD-10-CM | POA: Insufficient documentation

## 2022-10-08 DIAGNOSIS — M4316 Spondylolisthesis, lumbar region: Secondary | ICD-10-CM | POA: Diagnosis not present

## 2022-10-08 DIAGNOSIS — G2581 Restless legs syndrome: Secondary | ICD-10-CM | POA: Insufficient documentation

## 2022-10-08 DIAGNOSIS — E7800 Pure hypercholesterolemia, unspecified: Secondary | ICD-10-CM | POA: Diagnosis not present

## 2022-10-08 DIAGNOSIS — K760 Fatty (change of) liver, not elsewhere classified: Secondary | ICD-10-CM | POA: Diagnosis not present

## 2022-10-08 DIAGNOSIS — Z01818 Encounter for other preprocedural examination: Secondary | ICD-10-CM

## 2022-10-08 DIAGNOSIS — G4733 Obstructive sleep apnea (adult) (pediatric): Secondary | ICD-10-CM | POA: Insufficient documentation

## 2022-10-08 DIAGNOSIS — Z8673 Personal history of transient ischemic attack (TIA), and cerebral infarction without residual deficits: Secondary | ICD-10-CM | POA: Insufficient documentation

## 2022-10-08 DIAGNOSIS — K219 Gastro-esophageal reflux disease without esophagitis: Secondary | ICD-10-CM | POA: Insufficient documentation

## 2022-10-08 DIAGNOSIS — Z01812 Encounter for preprocedural laboratory examination: Secondary | ICD-10-CM | POA: Insufficient documentation

## 2022-10-08 DIAGNOSIS — E1136 Type 2 diabetes mellitus with diabetic cataract: Secondary | ICD-10-CM | POA: Insufficient documentation

## 2022-10-08 DIAGNOSIS — E119 Type 2 diabetes mellitus without complications: Secondary | ICD-10-CM

## 2022-10-08 HISTORY — DX: Herpesviral vesicular dermatitis: B00.1

## 2022-10-08 HISTORY — DX: Anemia, unspecified: D64.9

## 2022-10-08 LAB — COMPREHENSIVE METABOLIC PANEL
ALT: 53 U/L — ABNORMAL HIGH (ref 0–44)
AST: 51 U/L — ABNORMAL HIGH (ref 15–41)
Albumin: 3.7 g/dL (ref 3.5–5.0)
Alkaline Phosphatase: 67 U/L (ref 38–126)
Anion gap: 10 (ref 5–15)
BUN: 12 mg/dL (ref 8–23)
CO2: 26 mmol/L (ref 22–32)
Calcium: 9.1 mg/dL (ref 8.9–10.3)
Chloride: 103 mmol/L (ref 98–111)
Creatinine, Ser: 0.75 mg/dL (ref 0.44–1.00)
GFR, Estimated: >60 mL/min (ref >60)
Glucose, Bld: 171 mg/dL — ABNORMAL HIGH (ref 70–99)
Potassium: 4.2 mmol/L (ref 3.5–5.1)
Sodium: 139 mmol/L (ref 135–145)
Total Bilirubin: 0.2 mg/dL — ABNORMAL LOW (ref 0.3–1.2)
Total Protein: 7.7 g/dL (ref 6.5–8.1)

## 2022-10-08 LAB — CBC
HCT: 39.3 % (ref 36.0–46.0)
Hemoglobin: 10.9 g/dL — ABNORMAL LOW (ref 12.0–15.0)
MCH: 20.5 pg — ABNORMAL LOW (ref 26.0–34.0)
MCHC: 27.7 g/dL — ABNORMAL LOW (ref 30.0–36.0)
MCV: 73.9 fL — ABNORMAL LOW (ref 80.0–100.0)
Platelets: 246 10*3/uL (ref 150–400)
RBC: 5.32 MIL/uL — ABNORMAL HIGH (ref 3.87–5.11)
RDW: 30.2 % — ABNORMAL HIGH (ref 11.5–15.5)
WBC: 7 10*3/uL (ref 4.0–10.5)
nRBC: 0 % (ref 0.0–0.2)

## 2022-10-08 LAB — SURGICAL PCR SCREEN
MRSA, PCR: NEGATIVE
Staphylococcus aureus: NEGATIVE

## 2022-10-08 LAB — GLUCOSE, CAPILLARY: Glucose-Capillary: 165 mg/dL — ABNORMAL HIGH (ref 70–99)

## 2022-10-08 LAB — TYPE AND SCREEN
ABO/RH(D): A NEG
Antibody Screen: NEGATIVE

## 2022-10-08 LAB — HEMOGLOBIN A1C
Hgb A1c MFr Bld: 5.4 % (ref 4.8–5.6)
Mean Plasma Glucose: 108.28 mg/dL

## 2022-10-08 NOTE — Progress Notes (Signed)
PCP - Dr Terance Hart Cardiologist - n/a  Chest x-ray - n/a EKG - 05/12/22 CE - Requested Stress Test - n/a ECHO - n/a Cardiac Cath - n/a  ICD Pacemaker/Loop - n/a  Sleep Study -  Yes CPAP - uses CPAP nightly  Diabetes Type 2 Do not take Metformin on the morning of surgery.  The day of surgery, do not take Trulicity (dulaglutide) Insuin.  Last dose was on 10/07/22.  If your blood sugar is less than 70 mg/dL, you will need to treat for low blood sugar:. Treat a low blood sugar (less than 70 mg/dL) with  cup of clear juice (cranberry or apple), 4 glucose tablets, OR glucose gel. Recheck blood sugar in 15 minutes after treatment (to make sure it is greater than 70 mg/dL). If your blood sugar is not greater than 70 mg/dL on recheck, call [phone] for further instructions.  NPO  Anesthesia review: Yes  STOP now taking any Aspirin (unless otherwise instructed by your surgeon), Aleve, Naproxen, Ibuprofen, Motrin, Advil, Goody's, BC's, all herbal medications, fish oil, and all vitamins.   Coronavirus Screening Do you have any of the following symptoms:  Cough yes/no: No Fever (>100.45F)  yes/no: No Runny nose yes/no: No Sore throat yes/no: No Difficulty breathing/shortness of breath  yes/no: No  Have you traveled in the last 14 days and where? yes/no: No  Patient verbalized understanding of instructions that were given to them at the PAT appointment. Patient was also instructed that they will need to review over the PAT instructions again at home before surgery.

## 2022-10-09 NOTE — Progress Notes (Signed)
Anesthesia Chart Review:  Case: [id number] Date/Time: 10/22/22 0715   Procedure: PLIF L45 (Back) - 3C   Anesthesia type: General   Pre-op diagnosis: SPONDYLOLISTHESIS, LUMBAR REGION   Location: [facility] ROOM 21 / [facility] OR   Surgeons: Barnett Abu, MD       DISCUSSION: Patient is a 67 year old female scheduled for the above procedure.  History includes never smoker, post-operative N/V, HTN, hypercholesterolemia, GERD, OSA, TIA (2015), DM2, anemia, thyroiditis with hyperthyroidism (in remission; TSH normal 06/10/20), fatty liver, RLS, spinal surgery (C4-6 ACDF 04/10/08; L2-4 PLIF 01/12/17). BMI is consistent with obesity.   On metformin and Trulicity of DM2. Last Trulicity is scheduled for 10/07/22. A1c 5.4%.   Anesthesia team to evaluate on the day of surgery.    VS: BP (!) 146/75   Pulse 88   Temp 36.8 C   Resp 17   Ht 5\' 1"  (1.549 m)   Wt 94.7 kg   SpO2 98%   BMI 39.43 kg/m   PROVIDERS: Dorothey Baseman, MD is PCP    LABS: Preoperative labs noted. H/H 10.9/39.3, up from 9.2/34.0 on 09/21/22 ([facility]). She has been on oral iron. T&S done. AST 51, ALT 53, but has known fatty liver disease and has had prior mildly elevated LFTs (AST 41, ALT 38 on 08/04/22 and AST up to 103 and ALT 102 on 12/22/21). PLT count 246K. A1c 5.4%. TSH 1.163 on 06/10/20 ([facility]). Occasional ETOH use documented.  (all labs ordered are listed, but only abnormal results are displayed)  Labs Reviewed  GLUCOSE, CAPILLARY - Abnormal; Notable for the following components:      Result Value   Glucose-Capillary 165 (*)    All other components within normal limits  CBC - Abnormal; Notable for the following components:   RBC 5.32 (*)    Hemoglobin 10.9 (*)    MCV 73.9 (*)    MCH 20.5 (*)    MCHC 27.7 (*)    RDW 30.2 (*)    All other components within normal limits  COMPREHENSIVE METABOLIC PANEL - Abnormal; Notable for the following components:   Glucose, Bld 171 (*)    AST 51 (*)    ALT 53 (*)    Total Bilirubin  0.2 (*)    All other components within normal limits  SURGICAL PCR SCREEN  HEMOGLOBIN A1C  TYPE AND SCREEN     IMAGES: MRI L-spine 09/08/22: IMPRESSION: 1. At L4-L5, grade 1 anterolisthesis, severe canal stenosis and mild bilateral foraminal stenosis. 2. At L1-L2, mild canal stenosis. 3. L2-L4 PLIF.  Patent canal and foramina at these levels.   EKG: EKG 05/12/22 [facility]; preoperative for vaginal tear repair surgery): Sinus tachycardia at 105 bpm ELECTRONICALLY SIGNED BY: Wende Neighbors DO 15-May-2022 13:02:14  - OTHERWISE NORMAL ECG -    CV: N/A  Past Medical History:  Diagnosis Date   Allergy    Anemia    Arthritis    Broken ankle 11/2013   right ankle   Cataract    Bilateral - surgery to remove   Diabetes mellitus without complication (HCC)    type 2   Fatty liver    Fever blister    Fibromyalgia 2005   GERD (gastroesophageal reflux disease)    uses prilosec   Headache(784.0)    not as often as before   Hypercholesteremia    Hypertension    Hyperthyroidism    in remission per patient, no meds   OSA (obstructive sleep apnea)  uses CPAP nightly   Plantar fasciitis    no current problems per patient as of 10/08/22   Post-operative nausea and vomiting    Restless leg syndrome    Schatzki's ring    Spondylolisthesis of lumbar region    Stress incontinence    Stroke (HCC) 2015   TIA.  unable to determine if this is true.   Thyroiditis    Vertigo     Past Surgical History:  Procedure Laterality Date   CARPAL TUNNEL RELEASE Left 12/18/2015   Procedure: CARPAL TUNNEL RELEASE;  Surgeon: Erin Sons, MD;  Location: [facility] ORS;  Service: Orthopedics;  Laterality: Left;   CARPAL TUNNEL RELEASE Right 2018   CATARACT EXTRACTION W/PHACO Right 11/13/2015   Procedure: CATARACT EXTRACTION PHACO AND INTRAOCULAR LENS PLACEMENT (IOC);  Surgeon: Sallee Lange, MD;  Location: [facility] ORS;  Service: Ophthalmology;  Laterality: Right;  [place] 01:08AP% 21.6CDE  26.99Fluid pack lot # [id number]   CATARACT EXTRACTION W/PHACO Left 12/04/2015   Procedure: CATARACT EXTRACTION PHACO AND INTRAOCULAR LENS PLACEMENT (IOC);  Surgeon: Sallee Lange, MD;  Location: [facility] ORS;  Service: Ophthalmology;  Laterality: Left;  [place] 59.2 AP% 24.4 CDE 29.2 Fluid Pack lot # [id number]   CERVICAL DISC SURGERY     cervical decompression c4-5 and c5-6   COLONOSCOPY W/ BIOPSIES AND POLYPECTOMY     CTR     right hand   EYE SURGERY Bilateral    cataract    LUMBAR SPINE SURGERY  2018   SHOULDER SURGERY     right shoulder   TRIGGER FINGER RELEASE     right finger   VAGINA SURGERY  04/2022   vaginal tear repair   WISDOM TOOTH EXTRACTION      MEDICATIONS:  Dulaglutide (TRULICITY) 0.75 MG/0.5ML SOPN   alendronate (FOSAMAX) 70 MG tablet   b complex vitamins capsule   bisoprolol-hydrochlorothiazide (ZIAC) 2.5-6.25 MG tablet   CALCIUM CITRATE PO   Cholecalciferol (VITAMIN D) 2000 units CAPS   diclofenac Sodium (VOLTAREN) 1 % GEL   DULoxetine (CYMBALTA) 30 MG capsule   ferrous sulfate 325 (65 FE) MG EC tablet   hydrocortisone cream 1 %   MAGNESIUM PO   metFORMIN (GLUCOPHAGE-XR) 750 MG 24 hr tablet   omeprazole (PRILOSEC) 20 MG capsule   Potassium Gluconate 550 MG TABS   pramipexole (MIRAPEX) 0.5 MG tablet   rosuvastatin (CRESTOR) 10 MG tablet   SUMAtriptan (IMITREX) 100 MG tablet   traMADol (ULTRAM) 50 MG tablet   valACYclovir (VALTREX) 1000 MG tablet   vitamin B-12 (CYANOCOBALAMIN) 1000 MCG tablet   Vitamin D-Vitamin K (VITAMIN D2 + K1 PO)   zolpidem (AMBIEN) 5 MG tablet    triamcinolone acetonide (KENALOG) 10 MG/ML injection 10 mg    Shonna Chock, PA-C Surgical Short Stay/Anesthesiology [facility] Phone [phone] [facility] Phone [phone] 10/09/2022 4:08 PM

## 2022-10-09 NOTE — Anesthesia Preprocedure Evaluation (Addendum)
Anesthesia Evaluation  Patient identified by MRN, date of birth, ID band Patient awake    Reviewed: Allergy & Precautions, H&P , NPO status , Patient's Chart, lab work & pertinent test results, reviewed documented beta blocker date and time   History of Anesthesia Complications (+) PONV and history of anesthetic complications  Airway Mallampati: III  TM Distance: >3 FB Neck ROM: Full    Dental no notable dental hx. (+) Teeth Intact, Dental Advisory Given   Pulmonary sleep apnea and Continuous Positive Airway Pressure Ventilation    Pulmonary exam normal breath sounds clear to auscultation       Cardiovascular Exercise Tolerance: Good hypertension, Pt. on medications and Pt. on home beta blockers  Rhythm:Regular Rate:Normal     Neuro/Psych  Headaches  negative psych ROS   GI/Hepatic Neg liver ROS,GERD  Medicated,,  Endo/Other  diabetes, Type 2, Oral Hypoglycemic Agents  Morbid obesity  Renal/GU negative Renal ROS  negative genitourinary   Musculoskeletal  (+) Arthritis , Osteoarthritis,  Fibromyalgia -  Abdominal   Peds  Hematology  (+) Blood dyscrasia, anemia   Anesthesia Other Findings   Reproductive/Obstetrics negative OB ROS                             Anesthesia Physical Anesthesia Plan  ASA: 3  Anesthesia Plan: General   Post-op Pain Management:    Induction: Intravenous  PONV Risk Score and Plan: 4 or greater and Ondansetron, Dexamethasone, Scopolamine patch - Pre-op, Midazolam and Propofol infusion  Airway Management Planned: Oral ETT and Video Laryngoscope Planned  Additional Equipment:   Intra-op Plan:   Post-operative Plan: Extubation in OR  Informed Consent: I have reviewed the patients History and Physical, chart, labs and discussed the procedure including the risks, benefits and alternatives for the proposed anesthesia with the patient or authorized  representative who has indicated his/her understanding and acceptance.     Dental advisory given  Plan Discussed with: CRNA  Anesthesia Plan Comments: (PAT note written 10/09/2022 by Shonna Chock, PA-C.  )       Anesthesia Quick Evaluation

## 2022-10-12 ENCOUNTER — Other Ambulatory Visit: Payer: Self-pay | Admitting: Neurological Surgery

## 2022-10-22 ENCOUNTER — Ambulatory Visit (HOSPITAL_COMMUNITY): Payer: Medicare Other

## 2022-10-22 ENCOUNTER — Other Ambulatory Visit: Payer: Self-pay

## 2022-10-22 ENCOUNTER — Observation Stay (HOSPITAL_COMMUNITY)
Admission: RE | Admit: 2022-10-22 | Discharge: 2022-10-23 | Disposition: A | Discharge status: Home / Self Care | Payer: Medicare Other | Attending: Neurological Surgery | Admitting: Neurological Surgery

## 2022-10-22 ENCOUNTER — Ambulatory Visit (HOSPITAL_BASED_OUTPATIENT_CLINIC_OR_DEPARTMENT_OTHER): Payer: Medicare Other | Admitting: Anesthesiology

## 2022-10-22 ENCOUNTER — Encounter (HOSPITAL_COMMUNITY)
Admission: RE | Disposition: A | Discharge status: Home / Self Care | Payer: Self-pay | Source: Home / Self Care | Attending: Neurological Surgery

## 2022-10-22 ENCOUNTER — Encounter (HOSPITAL_COMMUNITY): Payer: Self-pay | Admitting: Neurological Surgery

## 2022-10-22 ENCOUNTER — Ambulatory Visit (HOSPITAL_COMMUNITY): Payer: Medicare Other | Admitting: Vascular Surgery

## 2022-10-22 DIAGNOSIS — I10 Essential (primary) hypertension: Secondary | ICD-10-CM

## 2022-10-22 DIAGNOSIS — M4316 Spondylolisthesis, lumbar region: Secondary | ICD-10-CM | POA: Diagnosis not present

## 2022-10-22 DIAGNOSIS — Z7984 Long term (current) use of oral hypoglycemic drugs: Secondary | ICD-10-CM | POA: Insufficient documentation

## 2022-10-22 DIAGNOSIS — E119 Type 2 diabetes mellitus without complications: Secondary | ICD-10-CM | POA: Diagnosis not present

## 2022-10-22 DIAGNOSIS — Z7985 Long-term (current) use of injectable non-insulin antidiabetic drugs: Secondary | ICD-10-CM | POA: Insufficient documentation

## 2022-10-22 DIAGNOSIS — Z79899 Other long term (current) drug therapy: Secondary | ICD-10-CM | POA: Diagnosis not present

## 2022-10-22 DIAGNOSIS — Z7722 Contact with and (suspected) exposure to environmental tobacco smoke (acute) (chronic): Secondary | ICD-10-CM | POA: Insufficient documentation

## 2022-10-22 DIAGNOSIS — Z8673 Personal history of transient ischemic attack (TIA), and cerebral infarction without residual deficits: Secondary | ICD-10-CM | POA: Diagnosis not present

## 2022-10-22 DIAGNOSIS — M48062 Spinal stenosis, lumbar region with neurogenic claudication: Secondary | ICD-10-CM | POA: Insufficient documentation

## 2022-10-22 DIAGNOSIS — M5416 Radiculopathy, lumbar region: Secondary | ICD-10-CM

## 2022-10-22 HISTORY — PX: LUMBAR FUSION: SHX111

## 2022-10-22 LAB — GLUCOSE, CAPILLARY
Glucose-Capillary: 124 mg/dL — ABNORMAL HIGH (ref 70–99)
Glucose-Capillary: 163 mg/dL — ABNORMAL HIGH (ref 70–99)
Glucose-Capillary: 245 mg/dL — ABNORMAL HIGH (ref 70–99)
Glucose-Capillary: 214 mg/dL — ABNORMAL HIGH (ref 70–99)

## 2022-10-22 SURGERY — POSTERIOR LUMBAR FUSION 1 LEVEL
Anesthesia: General | Site: Back

## 2022-10-22 MED ORDER — POTASSIUM GLUCONATE 550 MG PO TABS: 550.0000 mg | ORAL_TABLET | ORAL | Status: DC

## 2022-10-22 MED ORDER — ONDANSETRON HCL 4 MG/2ML IJ SOLN
INTRAMUSCULAR | Status: AC
  Filled 2022-10-22: qty 2

## 2022-10-22 MED ORDER — SCOPOLAMINE 1 MG/3DAYS TD PT72
MEDICATED_PATCH | TRANSDERMAL | Status: AC
  Administered 2022-10-22: 1.5 mg
  Filled 2022-10-22: qty 1

## 2022-10-22 MED ORDER — LIDOCAINE 2% (20 MG/ML) 5 ML SYRINGE
INTRAMUSCULAR | Status: AC
  Filled 2022-10-22: qty 5

## 2022-10-22 MED ORDER — ACETAMINOPHEN 325 MG PO TABS
650.0000 mg | ORAL_TABLET | ORAL | Status: DC | PRN
  Administered 2022-10-22: 650 mg via ORAL
  Filled 2022-10-22: qty 2

## 2022-10-22 MED ORDER — BISOPROLOL-HYDROCHLOROTHIAZIDE 2.5-6.25 MG PO TABS
1.0000 | ORAL_TABLET | Freq: Every day | ORAL | Status: DC
  Filled 2022-10-22: qty 1

## 2022-10-22 MED ORDER — LIDOCAINE-EPINEPHRINE 1 %-1:100000 IJ SOLN
INTRAMUSCULAR | Status: DC | PRN
  Administered 2022-10-22: 5 mL

## 2022-10-22 MED ORDER — PRAMIPEXOLE DIHYDROCHLORIDE 1.5 MG PO TABS
1.5000 mg | ORAL_TABLET | Freq: Every day | ORAL | Status: DC
  Filled 2022-10-22: qty 1

## 2022-10-22 MED ORDER — OXYCODONE-ACETAMINOPHEN 5-325 MG PO TABS: 1.0000 | ORAL_TABLET | ORAL | Status: DC | PRN

## 2022-10-22 MED ORDER — PHENYLEPHRINE HCL-NACL 20-0.9 MG/250ML-% IV SOLN
INTRAVENOUS | Status: DC | PRN
  Administered 2022-10-22: 20 ug/min via INTRAVENOUS

## 2022-10-22 MED ORDER — THROMBIN 5000 UNITS EX SOLR: OROMUCOSAL | Status: DC | PRN

## 2022-10-22 MED ORDER — METHOCARBAMOL 1000 MG/10ML IJ SOLN: 500.0000 mg | Freq: Four times a day (QID) | INTRAVENOUS | Status: DC | PRN

## 2022-10-22 MED ORDER — PHENYLEPHRINE 80 MCG/ML (10ML) SYRINGE FOR IV PUSH (FOR BLOOD PRESSURE SUPPORT)
PREFILLED_SYRINGE | INTRAVENOUS | Status: DC | PRN
  Administered 2022-10-22: 160 ug via INTRAVENOUS
  Administered 2022-10-22: 80 ug via INTRAVENOUS
  Administered 2022-10-22 (×3): 160 ug via INTRAVENOUS
  Administered 2022-10-22: 80 ug via INTRAVENOUS

## 2022-10-22 MED ORDER — ONDANSETRON HCL 4 MG/2ML IJ SOLN
INTRAMUSCULAR | Status: DC | PRN
Start: 2022-10-22 — End: 2022-10-22
  Administered 2022-10-22: 4 mg via INTRAVENOUS

## 2022-10-22 MED ORDER — METFORMIN HCL ER 750 MG PO TB24
750.0000 mg | ORAL_TABLET | Freq: Two times a day (BID) | ORAL | Status: DC
  Filled 2022-10-22: qty 1

## 2022-10-22 MED ORDER — TRAMADOL HCL 50 MG PO TABS: 50.0000 mg | ORAL_TABLET | Freq: Two times a day (BID) | ORAL | Status: DC | PRN

## 2022-10-22 MED ORDER — PROPOFOL 10 MG/ML IV BOLUS
INTRAVENOUS | Status: AC
  Filled 2022-10-22: qty 20

## 2022-10-22 MED ORDER — ALUM & MAG HYDROXIDE-SIMETH 200-200-20 MG/5ML PO SUSP: 30.0000 mL | Freq: Four times a day (QID) | ORAL | Status: DC | PRN

## 2022-10-22 MED ORDER — METHOCARBAMOL 500 MG PO TABS
500.0000 mg | ORAL_TABLET | Freq: Four times a day (QID) | ORAL | Status: DC | PRN
  Administered 2022-10-22 – 2022-10-23 (×3): 500 mg via ORAL
  Filled 2022-10-22 (×3): qty 1

## 2022-10-22 MED ORDER — PANTOPRAZOLE SODIUM 40 MG PO TBEC: 40.0000 mg | DELAYED_RELEASE_TABLET | Freq: Every day | ORAL | Status: DC

## 2022-10-22 MED ORDER — PHENOL 1.4 % MT LIQD: 1.0000 | OROMUCOSAL | Status: DC | PRN

## 2022-10-22 MED ORDER — CEFAZOLIN SODIUM-DEXTROSE 2-4 GM/100ML-% IV SOLN
2.0000 g | INTRAVENOUS | Status: AC
  Administered 2022-10-22 (×2): 2 g via INTRAVENOUS
  Filled 2022-10-22: qty 100

## 2022-10-22 MED ORDER — ALBUMIN HUMAN 5 % IV SOLN: INTRAVENOUS | Status: DC | PRN

## 2022-10-22 MED ORDER — MIDAZOLAM HCL 2 MG/2ML IJ SOLN
INTRAMUSCULAR | Status: DC | PRN
  Administered 2022-10-22: 2 mg via INTRAVENOUS

## 2022-10-22 MED ORDER — TRAMADOL HCL 50 MG PO TABS
50.0000 mg | ORAL_TABLET | Freq: Four times a day (QID) | ORAL | Status: DC | PRN
  Administered 2022-10-22 – 2022-10-23 (×3): 100 mg via ORAL
  Filled 2022-10-22 (×3): qty 2

## 2022-10-22 MED ORDER — ROSUVASTATIN CALCIUM 5 MG PO TABS: 10.0000 mg | ORAL_TABLET | Freq: Every day | ORAL | Status: DC

## 2022-10-22 MED ORDER — BUPIVACAINE HCL (PF) 0.5 % IJ SOLN
INTRAMUSCULAR | Status: DC | PRN
  Administered 2022-10-22: 5 mL
  Administered 2022-10-22: 25 mL

## 2022-10-22 MED ORDER — CHLORHEXIDINE GLUCONATE CLOTH 2 % EX PADS: 6.0000 | MEDICATED_PAD | Freq: Once | CUTANEOUS | Status: DC

## 2022-10-22 MED ORDER — THROMBIN 5000 UNITS EX SOLR
CUTANEOUS | Status: AC
  Filled 2022-10-22: qty 5000

## 2022-10-22 MED ORDER — ZOLPIDEM TARTRATE 5 MG PO TABS: 5.0000 mg | ORAL_TABLET | Freq: Every evening | ORAL | Status: DC | PRN

## 2022-10-22 MED ORDER — SODIUM CHLORIDE 0.9 % IV SOLN
250.0000 mL | INTRAVENOUS | Status: DC
  Administered 2022-10-22: 250 mL via INTRAVENOUS

## 2022-10-22 MED ORDER — BUPIVACAINE HCL (PF) 0.5 % IJ SOLN
INTRAMUSCULAR | Status: AC
  Filled 2022-10-22: qty 30

## 2022-10-22 MED ORDER — LACTATED RINGERS IV SOLN: INTRAVENOUS | Status: DC

## 2022-10-22 MED ORDER — SODIUM CHLORIDE 0.9% FLUSH
3.0000 mL | Freq: Two times a day (BID) | INTRAVENOUS | Status: DC
  Administered 2022-10-22 (×2): 3 mL via INTRAVENOUS

## 2022-10-22 MED ORDER — DEXAMETHASONE SODIUM PHOSPHATE 10 MG/ML IJ SOLN
INTRAMUSCULAR | Status: DC | PRN
  Administered 2022-10-22: 10 mg via INTRAVENOUS

## 2022-10-22 MED ORDER — FENTANYL CITRATE (PF) 250 MCG/5ML IJ SOLN
INTRAMUSCULAR | Status: DC | PRN
  Administered 2022-10-22 (×2): 50 ug via INTRAVENOUS
  Administered 2022-10-22: 150 ug via INTRAVENOUS

## 2022-10-22 MED ORDER — ONDANSETRON HCL 4 MG PO TABS: 4.0000 mg | ORAL_TABLET | Freq: Four times a day (QID) | ORAL | Status: DC | PRN

## 2022-10-22 MED ORDER — HYDROMORPHONE HCL 1 MG/ML IJ SOLN: 0.5000 mg | INTRAMUSCULAR | Status: DC | PRN

## 2022-10-22 MED ORDER — SUMATRIPTAN SUCCINATE 100 MG PO TABS: 100.0000 mg | ORAL_TABLET | ORAL | Status: DC | PRN

## 2022-10-22 MED ORDER — BISOPROLOL FUMARATE 5 MG PO TABS
2.5000 mg | ORAL_TABLET | Freq: Every day | ORAL | Status: DC
  Filled 2022-10-22: qty 0.5

## 2022-10-22 MED ORDER — METFORMIN HCL ER 750 MG PO TB24
750.0000 mg | ORAL_TABLET | Freq: Two times a day (BID) | ORAL | Status: DC
  Administered 2022-10-22: 750 mg via ORAL
  Filled 2022-10-22 (×2): qty 1

## 2022-10-22 MED ORDER — BISACODYL 10 MG RE SUPP: 10.0000 mg | Freq: Every day | RECTAL | Status: DC | PRN

## 2022-10-22 MED ORDER — SODIUM CHLORIDE 0.9 % IV SOLN
INTRAVENOUS | Status: DC | PRN
Start: 2022-10-22 — End: 2022-10-22

## 2022-10-22 MED ORDER — ROCURONIUM BROMIDE 10 MG/ML (PF) SYRINGE
PREFILLED_SYRINGE | INTRAVENOUS | Status: AC
  Filled 2022-10-22: qty 10

## 2022-10-22 MED ORDER — ACETAMINOPHEN 650 MG RE SUPP: 650.0000 mg | RECTAL | Status: DC | PRN

## 2022-10-22 MED ORDER — EPHEDRINE SULFATE-NACL 50-0.9 MG/10ML-% IV SOSY
PREFILLED_SYRINGE | INTRAVENOUS | Status: DC | PRN
  Administered 2022-10-22 (×2): 10 mg via INTRAVENOUS

## 2022-10-22 MED ORDER — LIDOCAINE-EPINEPHRINE 1 %-1:100000 IJ SOLN
INTRAMUSCULAR | Status: AC
  Filled 2022-10-22: qty 1

## 2022-10-22 MED ORDER — POLYETHYLENE GLYCOL 3350 17 G PO PACK: 17.0000 g | PACK | Freq: Every day | ORAL | Status: DC | PRN

## 2022-10-22 MED ORDER — FLEET ENEMA RE ENEM: 1.0000 | ENEMA | Freq: Once | RECTAL | Status: DC | PRN

## 2022-10-22 MED ORDER — CEFAZOLIN SODIUM-DEXTROSE 2-4 GM/100ML-% IV SOLN
2.0000 g | Freq: Three times a day (TID) | INTRAVENOUS | Status: AC
  Administered 2022-10-22 – 2022-10-23 (×2): 2 g via INTRAVENOUS
  Filled 2022-10-22 (×2): qty 100

## 2022-10-22 MED ORDER — FENTANYL CITRATE (PF) 250 MCG/5ML IJ SOLN
INTRAMUSCULAR | Status: AC
  Filled 2022-10-22: qty 5

## 2022-10-22 MED ORDER — SENNA 8.6 MG PO TABS
1.0000 | ORAL_TABLET | Freq: Two times a day (BID) | ORAL | Status: DC
  Administered 2022-10-22 – 2022-10-23 (×2): 8.6 mg via ORAL
  Filled 2022-10-22 (×2): qty 1

## 2022-10-22 MED ORDER — LIDOCAINE 2% (20 MG/ML) 5 ML SYRINGE
INTRAMUSCULAR | Status: DC | PRN
  Administered 2022-10-22: 100 mg via INTRAVENOUS

## 2022-10-22 MED ORDER — SUGAMMADEX SODIUM 200 MG/2ML IV SOLN
INTRAVENOUS | Status: DC | PRN
  Administered 2022-10-22: 200 mg via INTRAVENOUS

## 2022-10-22 MED ORDER — DOCUSATE SODIUM 100 MG PO CAPS
100.0000 mg | ORAL_CAPSULE | Freq: Two times a day (BID) | ORAL | Status: DC
  Filled 2022-10-22: qty 1

## 2022-10-22 MED ORDER — SODIUM CHLORIDE 0.9% FLUSH: 3.0000 mL | INTRAVENOUS | Status: DC | PRN

## 2022-10-22 MED ORDER — MENTHOL 3 MG MT LOZG: 1.0000 | LOZENGE | OROMUCOSAL | Status: DC | PRN

## 2022-10-22 MED ORDER — CHLORHEXIDINE GLUCONATE 0.12 % MT SOLN
15.0000 mL | Freq: Once | OROMUCOSAL | Status: AC
  Administered 2022-10-22: 15 mL via OROMUCOSAL
  Filled 2022-10-22: qty 15

## 2022-10-22 MED ORDER — ROCURONIUM BROMIDE 10 MG/ML (PF) SYRINGE
PREFILLED_SYRINGE | INTRAVENOUS | Status: DC | PRN
  Administered 2022-10-22: 80 mg via INTRAVENOUS
  Administered 2022-10-22 (×2): 20 mg via INTRAVENOUS

## 2022-10-22 MED ORDER — DULOXETINE HCL 30 MG PO CPEP
30.0000 mg | ORAL_CAPSULE | Freq: Every day | ORAL | Status: DC
  Filled 2022-10-22: qty 1

## 2022-10-22 MED ORDER — 0.9 % SODIUM CHLORIDE (POUR BTL) OPTIME
TOPICAL | Status: DC | PRN
  Administered 2022-10-22: 1000 mL

## 2022-10-22 MED ORDER — ONDANSETRON HCL 4 MG/2ML IJ SOLN: 4.0000 mg | Freq: Four times a day (QID) | INTRAMUSCULAR | Status: DC | PRN

## 2022-10-22 MED ORDER — PROPOFOL 10 MG/ML IV BOLUS
INTRAVENOUS | Status: DC | PRN
  Administered 2022-10-22: 100 mg via INTRAVENOUS

## 2022-10-22 MED ORDER — DEXAMETHASONE SODIUM PHOSPHATE 10 MG/ML IJ SOLN
INTRAMUSCULAR | Status: AC
  Filled 2022-10-22: qty 1

## 2022-10-22 MED ORDER — HYDROMORPHONE HCL 1 MG/ML IJ SOLN: 0.2500 mg | INTRAMUSCULAR | Status: DC | PRN

## 2022-10-22 MED ORDER — MIDAZOLAM HCL 2 MG/2ML IJ SOLN
INTRAMUSCULAR | Status: AC
  Filled 2022-10-22: qty 2

## 2022-10-22 MED ORDER — ORAL CARE MOUTH RINSE: 15.0000 mL | Freq: Once | OROMUCOSAL | Status: AC

## 2022-10-22 MED ORDER — CEFAZOLIN SODIUM 1 G IJ SOLR
INTRAMUSCULAR | Status: AC
  Filled 2022-10-22: qty 20

## 2022-10-22 SURGICAL SUPPLY — 67 items
ADH SKN CLS APL DERMABOND .7 (GAUZE/BANDAGES/DRESSINGS) ×1
APL SRG 60D 8 XTD TIP BNDBL (TIP)
BAG COUNTER SPONGE SURGICOUNT (BAG) ×1 IMPLANT
BAG SPNG CNTER NS LX DISP (BAG) ×1
BASKET BONE COLLECTION (BASKET) ×1 IMPLANT
BLADE BONE MILL MEDIUM (MISCELLANEOUS) ×1 IMPLANT
BLADE CLIPPER SURG (BLADE) IMPLANT
BONE CANC CHIPS 20CC PCAN1/4 (Bone Implant) ×1 IMPLANT
BUR MATCHSTICK NEURO 3.0 LAGG (BURR) ×1 IMPLANT
CAGE PLIF 8X9X23-12 LUMBAR (Cage) IMPLANT
CANISTER SUCT 3000ML PPV (MISCELLANEOUS) ×1 IMPLANT
CHIPS CANC BONE 20CC PCAN1/4 (Bone Implant) ×1 IMPLANT
CNTNR URN SCR LID CUP LEK RST (MISCELLANEOUS) ×1 IMPLANT
CONT SPEC 4OZ STRL OR WHT (MISCELLANEOUS) ×1
COVER BACK TABLE 60X90IN (DRAPES) ×1 IMPLANT
DERMABOND ADVANCED .7 DNX12 (GAUZE/BANDAGES/DRESSINGS) ×1 IMPLANT
DEVICE DISSECT PLASMABLAD 3.0S (MISCELLANEOUS) ×1 IMPLANT
DRAPE C-ARM 42X72 X-RAY (DRAPES) ×2 IMPLANT
DRAPE HALF SHEET 40X57 (DRAPES) IMPLANT
DRAPE LAPAROTOMY 100X72X124 (DRAPES) ×1 IMPLANT
DRSG OPSITE POSTOP 4X6 (GAUZE/BANDAGES/DRESSINGS) IMPLANT
DURAPREP 26ML APPLICATOR (WOUND CARE) ×1 IMPLANT
DURASEAL APPLICATOR TIP (TIP) IMPLANT
DURASEAL SPINE SEALANT 3ML (MISCELLANEOUS) IMPLANT
ELECT REM PT RETURN 9FT ADLT (ELECTROSURGICAL) ×1
ELECTRODE REM PT RTRN 9FT ADLT (ELECTROSURGICAL) ×1 IMPLANT
GAUZE 4X4 16PLY ~~LOC~~+RFID DBL (SPONGE) IMPLANT
GAUZE SPONGE 4X4 12PLY STRL (GAUZE/BANDAGES/DRESSINGS) ×1 IMPLANT
GLOVE BIOGEL PI IND STRL 8.5 (GLOVE) ×2 IMPLANT
GLOVE ECLIPSE 8.5 STRL (GLOVE) ×2 IMPLANT
GOWN STRL REUS W/ TWL LRG LVL3 (GOWN DISPOSABLE) IMPLANT
GOWN STRL REUS W/ TWL XL LVL3 (GOWN DISPOSABLE) IMPLANT
GOWN STRL REUS W/TWL 2XL LVL3 (GOWN DISPOSABLE) ×2 IMPLANT
GOWN STRL REUS W/TWL LRG LVL3 (GOWN DISPOSABLE)
GOWN STRL REUS W/TWL XL LVL3 (GOWN DISPOSABLE)
GRAFT BNE CANC CHIPS 1-8 20CC (Bone Implant) IMPLANT
GRAFT BONE PROTEIOS LRG 5CC (Orthopedic Implant) IMPLANT
HEMOSTAT POWDER KIT SURGIFOAM (HEMOSTASIS) ×1 IMPLANT
KIT BASIN OR (CUSTOM PROCEDURE TRAY) ×1 IMPLANT
KIT GRAFTMAG DEL NEURO DISP (NEUROSURGERY SUPPLIES) IMPLANT
KIT TURNOVER KIT B (KITS) ×1 IMPLANT
MILL BONE PREP (MISCELLANEOUS) ×1 IMPLANT
NDL HYPO 22X1.5 SAFETY MO (MISCELLANEOUS) ×1 IMPLANT
NEEDLE HYPO 22X1.5 SAFETY MO (MISCELLANEOUS) ×1 IMPLANT
NS IRRIG 1000ML POUR BTL (IV SOLUTION) ×1 IMPLANT
PACK LAMINECTOMY NEURO (CUSTOM PROCEDURE TRAY) ×1 IMPLANT
PAD ARMBOARD 7.5X6 YLW CONV (MISCELLANEOUS) ×3 IMPLANT
PATTIES SURGICAL .5 X1 (DISPOSABLE) ×1 IMPLANT
PLASMABLADE 3.0S (MISCELLANEOUS) ×1
ROD RELINE 0-0 CON M 5.0/6.0MM (Rod) IMPLANT
ROD RELINE TI LATERAL MED OFF (Rod) IMPLANT
SCREW LOCK RELINE 5.5 TULIP (Screw) IMPLANT
SCREW RELINE-O POLY 6.5X45 (Screw) IMPLANT
SPIKE FLUID TRANSFER (MISCELLANEOUS) ×1 IMPLANT
SPONGE SURGIFOAM ABS GEL 100 (HEMOSTASIS) IMPLANT
SPONGE T-LAP 4X18 ~~LOC~~+RFID (SPONGE) IMPLANT
SUT PROLENE 6 0 BV (SUTURE) IMPLANT
SUT VIC AB 1 CT1 18XBRD ANBCTR (SUTURE) ×1 IMPLANT
SUT VIC AB 1 CT1 8-18 (SUTURE) ×1
SUT VIC AB 2-0 CP2 18 (SUTURE) ×1 IMPLANT
SUT VIC AB 3-0 SH 8-18 (SUTURE) ×1 IMPLANT
SUT VIC AB 4-0 RB1 18 (SUTURE) ×1 IMPLANT
SYR 3ML LL SCALE MARK (SYRINGE) ×4 IMPLANT
TOWEL GREEN STERILE (TOWEL DISPOSABLE) ×1 IMPLANT
TOWEL GREEN STERILE FF (TOWEL DISPOSABLE) ×1 IMPLANT
TRAY FOLEY MTR SLVR 16FR STAT (SET/KITS/TRAYS/PACK) ×1 IMPLANT
WATER STERILE IRR 1000ML POUR (IV SOLUTION) ×1 IMPLANT

## 2022-10-22 NOTE — Anesthesia Procedure Notes (Signed)
Procedure Name: Intubation Date/Time: 10/22/2022 8:07 AM  Performed by: Orlin Hilding, CRNAPre-anesthesia Checklist: Patient identified, Emergency Drugs available, Suction available, Patient being monitored and Timeout performed Patient Re-evaluated:Patient Re-evaluated prior to induction Oxygen Delivery Method: Circle system utilized Preoxygenation: Pre-oxygenation with 100% oxygen Induction Type: IV induction Ventilation: Mask ventilation without difficulty and Oral airway inserted - appropriate to patient size Laryngoscope Size: Glidescope and 3 Grade View: Grade I Tube type: Oral Tube size: 7.0 mm Number of attempts: 1 Placement Confirmation: ETT inserted through vocal cords under direct vision, positive ETCO2 and breath sounds checked- equal and bilateral Secured at: 23 cm Tube secured with: Tape Dental Injury: Teeth and Oropharynx as per pre-operative assessment

## 2022-10-22 NOTE — Anesthesia Postprocedure Evaluation (Signed)
Anesthesia Post Note  Patient: DONNISE ELMI  Procedure(s) Performed: Posterior Lumbar Interbody Fusion Lumbar Four- Lumbar Five (Back)     Patient location during evaluation: PACU Anesthesia Type: General Level of consciousness: awake and alert Pain management: pain level controlled Vital Signs Assessment: post-procedure vital signs reviewed and stable Respiratory status: spontaneous breathing, nonlabored ventilation and respiratory function stable Cardiovascular status: blood pressure returned to baseline and stable Postop Assessment: no apparent nausea or vomiting Anesthetic complications: no  No notable events documented.  Last Vitals:  Vitals:   10/22/22 1300 10/22/22 1315  BP: 113/62 (!) 117/59  Pulse: 73 77  Resp: 15 13  Temp:    SpO2: 92% 94%    Last Pain:  Vitals:   10/22/22 1245  TempSrc:   PainSc: Asleep    LLE Motor Response: Purposeful movement (10/22/22 1315) LLE Sensation: Full sensation (10/22/22 1315) RLE Motor Response: Purposeful movement (10/22/22 1315) RLE Sensation: Full sensation (10/22/22 1315)      Malavika Lira,W. EDMOND

## 2022-10-22 NOTE — Op Note (Signed)
Date of surgery: 10/22/2022 Preoperative diagnosis: Spondylolisthesis L4-L5 with stenosis, history of decompression and fusion L2-L4.  Lumbar radiculopathy.  Neurogenic claudication. Postoperative diagnosis: Same Procedure: Bilateral laminectomies at L4-L5 decompression of L4 and L5 nerve roots with more workup required for simple interbody technique.  Posterior lumbar interbody arthrodesis with peek spacers local autograft allograft.  Posterolateral arthrodesis with local autograft allograft and Proteus.  Pedicle screw fixation previous hardware L2-L4 to new fixation at L5.  Fluoroscopic visualization. Surgeon: Barnett Abu First Assistant: Lisbeth Renshaw MD Anesthesia: General Tracheal Indications: Carolyn Graham is a 67 year old individual who about 6 years ago underwent surgical decompression and stabilization from L4 forward to L2.  She did well after that surgery but over the last year developed progressive worsening back pain and subsequently leg pain and weakness is noted to have severe spondylitic stenosis at L4-L5 with a lumbar fusion.  She has been advised regarding the need for surgical revision to incorporate L4-L5 fusion as she has a spondylolisthesis at that level.  Procedure: Patient was brought to the operating room supine on stretcher after the smooth induction of general anesthesia, she was carefully turned prone.  Alcohol DuraPrep and draped in a sterile fashion.  The previously made midline incision was reopened and dissection was carried down inferiorly to expose the inferior aspect of the hardware and the L4 screws.  The rod just above this area was exposed and the tissues around it were removed to allow placement, W. Hock to allow extension rod to be placed medially.  Then the inferior margin was decompressed to expose the laminar arch of the 4 out to the L4-5 facets.  High-speed drill was then used to remove the inferior marginal lamina of L4 out to including the entirety of the  facet at L4-L5.  This exposed the thickened redundant L ligament and ultimately this was able to be removed carefully to decompress the common dural tube and the L4 nerve root superiorly in the L5 nerve root inferiorly.  The space was exposed and isolated.  After adequate decompression was performed of the space was entered with a #15 blade and a combination of curettes and rongeurs was used to evacuate a very modest amount of severely degenerated desiccated disc material.  The interspace was opened with a self-retaining disc spreader and we worked contralaterally with Dr. Conchita Paris to help protect the nerve roots and the common dural tube medially while the opposite side was exposed and we worked laterally.  The procedure was continued on both sides to decompress the space entirely.  When this was adequately decompressed the interspace was sized for appropriately sized spacers felt that an 8 mm tall 12 degree lordotic 23 mm long spacer would fit best into this interval a total of 16 cc of bone graft was packed into the interspace with 2 spacers.  Next attention was turned to the L5 pedicles the lateral gutters were decorticated and hemostasis was carefully obtained there and a total of 6 cc of bone graft was packed into each lateral gutter and intertransverse space between L4 and L5 this was done with the help of Dr. Conchita Paris also.  Then pedicle sites were chosen at L5 using the internal landmarks which were easily palpable.  6.5 x 45 mm screws were placed into the pedicles at L5 and these were verified radiographically.  Then a Z rod was used to connect the L5 screws to the W connectors above the L4 hardware.  This was done in a neutral fashion.  Final  radiographs were obtained in AP and lateral projection showed good alignment with some restoration of lordosis and relief of the spondylolisthesis that was present at L4-L5.  With this the wound was checked for hemostasis care was taken to make sure that the L4 and  L5 nerve roots were well decompressed when verified the lumbodorsal fascia was closed with #1 Vicryl in interrupted fashion 2-0 Vicryl's used in the subcutaneous tissues 3-0 Vicryl and 4-0 Vicryl subcuticular skin.  Blood loss is estimated 575 cc on the 15 cc of Cell Saver blood was returned to the patient.  25 cc of half percent Marcaine was injected into the paraspinous fascia at the time of closure.

## 2022-10-22 NOTE — Transfer of Care (Signed)
Immediate Anesthesia Transfer of Care Note  Patient: Carolyn Graham  Procedure(s) Performed: Posterior Lumbar Interbody Fusion Lumbar Four- Lumbar Five (Back)  Patient Location: PACU  Anesthesia Type:General  Level of Consciousness: drowsy, patient cooperative, and responds to stimulation  Airway & Oxygen Therapy: Patient Spontanous Breathing and Patient connected to face mask oxygen  Post-op Assessment: Report given to RN and Post -op Vital signs reviewed and stable  Post vital signs: Reviewed and stable  Last Vitals:  Vitals Value Taken Time  BP 123/69 10/22/22 1245  Temp    Pulse 80 10/22/22 1247  Resp 16 10/22/22 1247  SpO2 99 % 10/22/22 1247  Vitals shown include unfiled device data.  Last Pain:  Vitals:   10/22/22 0710  TempSrc:   PainSc: 4          Complications: No notable events documented.

## 2022-10-22 NOTE — H&P (Signed)
Carolyn Graham is an 67 y.o. female.   Chief Complaint: Back and bilateral lower extremity pain HPI: Patient is a 67 year old individual who was previously undergone decompression and arthrodesis from L2-L4 secondary to spondylitic stenosis.  She has now developed adjacent level disease at L4-L5 and has been advised regarding need for surgery to decompress and stabilize L4-L5.  She is admitted for that procedure.  Past Medical History:  Diagnosis Date   Allergy    Anemia    Arthritis    Broken ankle 11/2013   right ankle   Cataract    Bilateral - surgery to remove   Diabetes mellitus without complication (HCC)    type 2   Fatty liver    Fever blister    Fibromyalgia 2005   GERD (gastroesophageal reflux disease)    uses prilosec   Headache(784.0)    not as often as before   Hypercholesteremia    Hypertension    Hyperthyroidism    in remission per patient, no meds   OSA (obstructive sleep apnea)    uses CPAP nightly   Plantar fasciitis    no current problems per patient as of 10/08/22   Post-operative nausea and vomiting    Restless leg syndrome    Schatzki's ring    Spondylolisthesis of lumbar region    Stress incontinence    Stroke (HCC) 2015   TIA.  unable to determine if this is true.   Thyroiditis    Vertigo     Past Surgical History:  Procedure Laterality Date   CARPAL TUNNEL RELEASE Left 12/18/2015   Procedure: CARPAL TUNNEL RELEASE;  Surgeon: Erin Sons, MD;  Location: [facility] ORS;  Service: Orthopedics;  Laterality: Left;   CARPAL TUNNEL RELEASE Right 2018   CATARACT EXTRACTION W/PHACO Right 11/13/2015   Procedure: CATARACT EXTRACTION PHACO AND INTRAOCULAR LENS PLACEMENT (IOC);  Surgeon: Sallee Lange, MD;  Location: [facility] ORS;  Service: Ophthalmology;  Laterality: Right;  [place] 01:08AP% 21.6CDE 26.99Fluid pack lot # [id number]   CATARACT EXTRACTION W/PHACO Left 12/04/2015   Procedure: CATARACT EXTRACTION PHACO AND INTRAOCULAR LENS PLACEMENT (IOC);  Surgeon:  Sallee Lange, MD;  Location: [facility] OR[facility];  Service: Ophthalmology;  Laterality: Left;  [place] 59.2 AP% 24.4 CDE 29.2 Fluid Pack lot # [id number]   CERVICAL DISC SURGERY     cervical decompression c4-5 and c5-6   COLONOSCOPY W/ BIOPSIES AND POLYPECTOMY     CTR     right hand   EYE SURGERY Bilateral    cataract    LUMBAR SPINE SURGERY  2018   SHOULDER SURGERY     right shoulder   TRIGGER FINGER RELEASE     right finger   VAGINA SURGERY  04/2022   vaginal tear repair   WISDOM TOOTH EXTRACTION      Family History  Problem Relation Age of Onset   Colon cancer Mother 25   Lung cancer Father 51   Colon cancer Maternal Grandfather    Colon polyps Maternal Aunt    Breast cancer Neg Hx    Esophageal cancer Neg Hx    Stomach cancer Neg Hx    Rectal cancer Neg Hx    Social History:  reports that she has never smoked. She has been exposed to tobacco smoke. She has never used smokeless tobacco. She reports current alcohol use. She reports that she does not use drugs.  Allergies:  Allergies  Allergen Reactions   Codeine Nausea And Vomiting   Darvocet [Propoxyphene N-Acetaminophen] Nausea Only  Facility-Administered Medications Prior to Admission  Medication Dose Route Frequency Provider Last Rate Last Admin   triamcinolone acetonide (KENALOG) 10 MG/ML injection 10 mg  10 mg Other Once Lenn Sink, DPM       Medications Prior to Admission  Medication Sig Dispense Refill   alendronate (FOSAMAX) 70 MG tablet Take 70 mg by mouth once a week. Takes on saturday     b complex vitamins capsule Take 1 capsule by mouth daily.     bisoprolol-hydrochlorothiazide (ZIAC) 2.5-6.25 MG tablet Take 1 tablet by mouth daily.     CALCIUM CITRATE PO Take 1 tablet by mouth daily.     Cholecalciferol (VITAMIN D) 2000 units CAPS Take 2,000 Units daily by mouth.      diclofenac Sodium (VOLTAREN) 1 % GEL Apply 2-4 grams to affected joint 4 times daily as needed. 400 g 2   Dulaglutide (TRULICITY) 0.75  MG/0.5ML SOPN Inject 0.75 mg into the skin every 7 (seven) days. Takes on Wednesday     DULoxetine (CYMBALTA) 30 MG capsule Take 30 mg by mouth at bedtime. Takes at night     ferrous sulfate 325 (65 FE) MG EC tablet Take 325 mg by mouth daily.     MAGNESIUM PO Take 600 mg by mouth at bedtime.     metFORMIN (GLUCOPHAGE-XR) 750 MG 24 hr tablet Take 750 mg by mouth in the morning and at bedtime.     omeprazole (PRILOSEC) 20 MG capsule Take 20 mg by mouth daily     Potassium Gluconate 550 MG TABS Take 550 mg by mouth every other day.     pramipexole (MIRAPEX) 0.5 MG tablet Take 1.5 mg by mouth at bedtime.  8   rosuvastatin (CRESTOR) 10 MG tablet Take 10 mg by mouth daily.     traMADol (ULTRAM) 50 MG tablet TAKE 1 TABLET(50 MG) BY MOUTH AT BEDTIME AS NEEDED 30 tablet 0   vitamin B-12 (CYANOCOBALAMIN) 1000 MCG tablet Take 1,000 mcg by mouth daily.     Vitamin D-Vitamin K (VITAMIN D2 + K1 PO) Take 1 tablet by mouth daily.     zolpidem (AMBIEN) 5 MG tablet TAKE 1 TABLET BY MOUTH AT BEDTIME AS NEEDED FOR SLEEP 30 tablet 0   hydrocortisone cream 1 % Apply 1 Application topically daily.     SUMAtriptan (IMITREX) 100 MG tablet Take 100 mg by mouth every 2 (two) hours as needed for migraine or headache.     valACYclovir (VALTREX) 1000 MG tablet Take 1,000 mg by mouth daily as needed (for fever blisters).      Results for orders placed or performed during the hospital encounter of 10/22/22 (from the past 48 hour(s))  Glucose, capillary     Status: Abnormal   Collection Time: 10/22/22  5:50 AM  Result Value Ref Range   Glucose-Capillary 124 (H) 70 - 99 mg/dL    Comment: Glucose reference range applies only to samples taken after fasting for at least 8 hours.   No results found.  Review of Systems  Constitutional:  Positive for activity change.  Musculoskeletal:  Positive for back pain and myalgias.  Neurological:  Positive for weakness.  All other systems reviewed and are negative.   Blood  pressure (!) 153/71, pulse 80, temperature 98.2 F (36.8 C), temperature source Oral, resp. rate 18, height 5\' 1"  (1.549 m), weight 99.8 kg, SpO2 94%. Physical Exam Constitutional:      Appearance: Normal appearance. She is obese.  HENT:     Head:  Normocephalic and atraumatic.     Right Ear: Tympanic membrane, ear canal and external ear normal.     Left Ear: Tympanic membrane, ear canal and external ear normal.     Nose: Nose normal.     Mouth/Throat:     Mouth: Mucous membranes are moist.     Pharynx: Oropharynx is clear.  Eyes:     Extraocular Movements: Extraocular movements intact.     Conjunctiva/sclera: Conjunctivae normal.     Pupils: Pupils are equal, round, and reactive to light.  Cardiovascular:     Rate and Rhythm: Normal rate and regular rhythm.  Pulmonary:     Effort: Pulmonary effort is normal.     Breath sounds: Normal breath sounds.  Abdominal:     General: Abdomen is flat.     Palpations: Abdomen is soft.  Musculoskeletal:        General: Normal range of motion.     Cervical back: Normal range of motion.  Skin:    General: Skin is warm and dry.     Capillary Refill: Capillary refill takes less than 2 seconds.  Neurological:     General: No focal deficit present.     Mental Status: She is alert and oriented to person, place, and time. Mental status is at baseline.  Psychiatric:        Mood and Affect: Mood normal.        Behavior: Behavior normal.        Thought Content: Thought content normal.        Judgment: Judgment normal.      Assessment/Plan Spondylolisthesis L4-L5 with stenosis and neurogenic claudication.  Plan: Posterior lateral decompression with interbody arthrodesis L4-L5 posterolateral fusion L4-L5 pedicle screw fixation L4-L5.  Stefani Dama, MD 10/22/2022, 7:47 AM

## 2022-10-23 DIAGNOSIS — M4316 Spondylolisthesis, lumbar region: Secondary | ICD-10-CM | POA: Diagnosis not present

## 2022-10-23 LAB — CBC
HCT: 29.7 % — ABNORMAL LOW (ref 36.0–46.0)
Hemoglobin: 8.6 g/dL — ABNORMAL LOW (ref 12.0–15.0)
MCH: 21.3 pg — ABNORMAL LOW (ref 26.0–34.0)
MCHC: 29 g/dL — ABNORMAL LOW (ref 30.0–36.0)
MCV: 73.7 fL — ABNORMAL LOW (ref 80.0–100.0)
Platelets: 173 10*3/uL (ref 150–400)
RBC: 4.03 MIL/uL (ref 3.87–5.11)
RDW: 27.9 % — ABNORMAL HIGH (ref 11.5–15.5)
WBC: 12.6 10*3/uL — ABNORMAL HIGH (ref 4.0–10.5)
nRBC: 0 % (ref 0.0–0.2)

## 2022-10-23 LAB — BASIC METABOLIC PANEL
Anion gap: 11 (ref 5–15)
BUN: 14 mg/dL (ref 8–23)
CO2: 21 mmol/L — ABNORMAL LOW (ref 22–32)
Calcium: 8.6 mg/dL — ABNORMAL LOW (ref 8.9–10.3)
Chloride: 103 mmol/L (ref 98–111)
Creatinine, Ser: 0.92 mg/dL (ref 0.44–1.00)
GFR, Estimated: >60 mL/min (ref >60)
Glucose, Bld: 122 mg/dL — ABNORMAL HIGH (ref 70–99)
Potassium: 3.8 mmol/L (ref 3.5–5.1)
Sodium: 135 mmol/L (ref 135–145)

## 2022-10-23 LAB — GLUCOSE, CAPILLARY: Glucose-Capillary: 127 mg/dL — ABNORMAL HIGH (ref 70–99)

## 2022-10-23 MED ORDER — CEPHALEXIN 500 MG PO CAPS: 500.0000 mg | ORAL_CAPSULE | Freq: Three times a day (TID) | ORAL | 0 refills | Status: AC

## 2022-10-23 MED ORDER — METHOCARBAMOL 500 MG PO TABS: 500.0000 mg | ORAL_TABLET | Freq: Four times a day (QID) | ORAL | 3 refills | Status: DC | PRN

## 2022-10-23 MED ORDER — HYDROMORPHONE HCL 2 MG PO TABS: 2.0000 mg | ORAL_TABLET | ORAL | 0 refills | Status: AC | PRN

## 2022-10-23 NOTE — Discharge Instructions (Signed)
Wound Care Remove dressing in 3 days Leave incision open to air. You may shower. Do not scrub directly on incision.  Do not put any creams, lotions, or ointments on incision. Activity Walk each and every day, increasing distance each day. No lifting greater than 8 lbs.  Avoid bending, arching, and twisting. No driving for 2 weeks; may ride as a passenger locally. If provided with back brace, wear when out of bed.  It is not necessary to wear in bed. Diet Resume your normal diet.   Call Your Doctor If Any of These Occur Redness, drainage, or swelling at the wound.  Temperature greater than 101 degrees. Severe pain not relieved by pain medication. Incision starts to come apart. Follow Up Appt Call  [phone]) for problems.  If you have any hardware placed in your spine, you will need an x-ray before your appointment.  

## 2022-10-23 NOTE — Progress Notes (Signed)
 Patient alert and oriented, void, ambulate. D/c instructions explain and given all questions answered. Surgical site clean and dry no sign of infection.

## 2022-10-23 NOTE — Evaluation (Signed)
Occupational Therapy Evaluation Patient Details Name: Carolyn Graham MRN: [id number] DOB: [birth year] Today's Date: 10/23/2022   History of Present Illness 67 yo female s/p 8/29 PLIF L4-5 PMH arthritis, DM2, fatty liver, fibromyalgia, GERD, HTN, schatzki rings, stress incontinence, TIA, OSA uses CPAP, hyperthyroidism, vertigo, R shoulder surg, lumbar surgy 2018   Clinical Impression   Patient evaluated by Occupational Therapy with no further acute OT needs identified. All education has been completed and the patient has no further questions. See below for any follow-up Occupational Therapy or equipment needs. OT to sign off. Thank you for referral.         If plan is discharge home, recommend the following:      Functional Status Assessment  Patient has had a recent decline in their functional status and demonstrates the ability to make significant improvements in function in a reasonable and predictable amount of time.  Equipment Recommendations  None recommended by OT    Recommendations for Other Services       Precautions / Restrictions Precautions Precautions: Back Precaution Comments: handout present in room and reviwed for alds. Restrictions Weight Bearing Restrictions: No Other Position/Activity Restrictions: Per order, no brace needed. Pt has lumbar corset in room from previous sx. Donned for session.      Mobility Bed Mobility Overal bed mobility: Modified Independent                  Transfers Overall transfer level: Modified independent Equipment used: Rolling walker (2 wheels)                      Balance Overall balance assessment: Needs assistance Sitting-balance support: No upper extremity supported, Feet supported Sitting balance-Leahy Scale: Good     Standing balance support: Bilateral upper extremity supported, No upper extremity supported, During functional activity Standing balance-Leahy Scale: Fair Standing balance comment: static  stand without UE support. RW for amb.                           ADL either performed or assessed with clinical judgement   ADL Overall ADL's : Modified independent                                       General ADL Comments: pt completed all dressing with hip hitch side sitting on the eob. pt able to reach feet in this method. pt dressed for home and don doff brace x2 during session. pt has spouse (A) as needed at home. Pt plans to purchase toilet aide. pt trying to decide between bidget adapter and the toilet being replaced.  Back handout provided and reviewed adls in detail. Pt educated on: clothing between brace, never sleep in brace, set an alarm at night for medication, avoid sitting for long periods of time, correct bed positioning for sleeping, correct sequence for bed mobility, avoiding lifting more than 5 pounds and never wash directly over incision. All education is complete and patient indicates understanding.    Vision Baseline Vision/History: 0 No visual deficits Ability to See in Adequate Light: 0 Adequate Patient Visual Report: No change from baseline       Perception         Praxis         Pertinent Vitals/Pain Pain Assessment Pain Assessment: 0-10 Pain Score: 3  Pain Location: back Pain Descriptors /  Indicators: Discomfort, Sore Pain Intervention(s): Monitored during session, Repositioned     Extremity/Trunk Assessment Upper Extremity Assessment Upper Extremity Assessment: Overall WFL for tasks assessed   Lower Extremity Assessment Lower Extremity Assessment: Overall WFL for tasks assessed   Cervical / Trunk Assessment Cervical / Trunk Assessment: Back Surgery   Communication Communication Communication: No apparent difficulties   Cognition Arousal: Alert Behavior During Therapy: WFL for tasks assessed/performed Overall Cognitive Status: Within Functional Limits for tasks assessed                                        General Comments  incision dry and intact at this time    Exercises     Shoulder Instructions      Home Living Family/patient expects to be discharged to:: Private residence Living Arrangements: Spouse/significant other Available Help at Discharge: Family;Available 24 hours/day Type of Home: House Home Access: Stairs to enter [employer] of Steps: 2 Entrance Stairs-Rails: None Home Layout: Two level Alternate Level Stairs-Number of Steps: Plans to stay in studio in basement with bedroom and full bath. Flight of stairs to access or level outdoor entry. Alternate Level Stairs-Rails: Right Bathroom Shower/Tub: Chief Strategy Officer: Standard     Home Equipment: Teacher, English as a foreign language (2 wheels);Adaptive equipment;Hand held shower head Adaptive Equipment: Long-handled shoe horn Additional Comments: has inside outside cat that is not theirs but they help take care of and allow in house. spouse can manage the cat      Prior Functioning/Environment Prior Level of Function : Independent/Modified Independent;Driving                        OT Problem List:        OT Treatment/Interventions:      OT Goals(Current goals can be found in the care plan section) Acute Rehab OT Goals Patient Stated Goal: to get adaptive equipment at the medical store or [employer] Potential to Achieve Goals: Good  OT Frequency:      Co-evaluation              AM-PAC OT "6 Clicks" Daily Activity     Outcome Measure Help from another person eating meals?: None Help from another person taking care of personal grooming?: None Help from another person toileting, which includes using toliet, bedpan, or urinal?: None Help from another person bathing (including washing, rinsing, drying)?: None Help from another person to put on and taking off regular upper body clothing?: None Help from another person to put on and taking off regular lower body clothing?:  None 6 Click Score: 24   End of Session Equipment Utilized During Treatment: Rolling walker (2 wheels);Back brace Nurse Communication: Mobility status;Precautions  Activity Tolerance: Patient tolerated treatment well Patient left: in bed;with call bell/phone within reach  OT Visit Diagnosis: Unsteadiness on feet (R26.81)                Time: 7829-5621 OT Time Calculation (min): 15 min Charges:  OT General Charges $OT Visit: 1 Visit OT Evaluation $OT Eval Moderate Complexity: 1 Mod   Brynn, OTR/L  Acute Rehabilitation Services Office: [phone] .   Mateo Flow 10/23/2022, 9:55 AM

## 2022-10-23 NOTE — Evaluation (Signed)
Physical Therapy Evaluation Patient Details Name: JUPITER PIGMAN MRN: [id number] DOB: [birth year] Today's Date: 10/23/2022  History of Present Illness  67 yo female s/p 8/29 PLIF L4-5 PMH arthritis, DM2, fatty liver, fibromyalgia, GERD, HTN, schatzki rings, stress incontinence, TIA, OSA uses CPAP, hyperthyroidism, vertigo, R shoulder surg, lumbar surgy 2018   Clinical Impression  PT eval complete. Pt mod I bed mobility and transfers. Supervision amb 350' with RW and ascend/descend 4 steps with R rail. Educated on 3/3 back precaution. Pt will have needed level of assist at home from husband. Plan is for d/c home today. No follow up PT indicated. Pt has all needed DME. PT signing off.         If plan is discharge home, recommend the following: A little help with bathing/dressing/bathroom;Assist for transportation;Assistance with cooking/housework;Help with stairs or ramp for entrance   Can travel by private vehicle        Equipment Recommendations None recommended by PT  Recommendations for Other Services       Functional Status Assessment Patient has had a recent decline in their functional status and demonstrates the ability to make significant improvements in function in a reasonable and predictable amount of time.     Precautions / Restrictions Precautions Precautions: Back Precaution Comments: Educated on 3/3 back precautions. Handout provided. Restrictions Other Position/Activity Restrictions: Per order, no brace needed. Pt has lumbar corset in room from previous sx. Donned for session.      Mobility  Bed Mobility Overal bed mobility: Modified Independent                  Transfers Overall transfer level: Modified independent Equipment used: Rolling walker (2 wheels)                    Ambulation/Gait Ambulation/Gait assistance: Supervision Gait Distance (Feet): 350 Feet Assistive device: Rolling walker (2 wheels) Gait Pattern/deviations: Step-through  pattern Gait velocity: decreased Gait velocity interpretation: 1.31 - 2.62 ft/sec, indicative of limited community ambulator   General Gait Details: steady gait with RW  Stairs Stairs: Yes Stairs assistance: Supervision Stair Management: One rail Right, Sideways, Step to pattern Number of Stairs: 4 General stair comments: Good recall of technique from previous back sx.  Wheelchair Mobility     Tilt Bed    Modified Rankin (Stroke Patients Only)       Balance Overall balance assessment: Needs assistance Sitting-balance support: No upper extremity supported, Feet supported Sitting balance-Leahy Scale: Good     Standing balance support: Bilateral upper extremity supported, No upper extremity supported, During functional activity Standing balance-Leahy Scale: Fair Standing balance comment: static stand without UE support. RW for amb.                             Pertinent Vitals/Pain Pain Assessment Pain Assessment: 0-10 Pain Score: 3  Pain Location: back Pain Descriptors / Indicators: Discomfort, Sore Pain Intervention(s): Monitored during session    Home Living Family/patient expects to be discharged to:: Private residence Living Arrangements: Spouse/significant other Available Help at Discharge: Family;Available 24 hours/day Type of Home: House Home Access: Stairs to enter Entrance Stairs-Rails: None Entrance Stairs-Number of Steps: 2 Alternate Level Stairs-Number of Steps: Plans to stay in studio in basement with bedroom and full bath. Flight of stairs to access or level outdoor entry. Home Layout: Two level Home Equipment: BSC/3in1;Rolling Walker (2 wheels)      Prior Function Prior Level of Function :  Independent/Modified Independent;Driving                     Extremity/Trunk Assessment   Upper Extremity Assessment Upper Extremity Assessment: Defer to OT evaluation    Lower Extremity Assessment Lower Extremity Assessment: Overall  WFL for tasks assessed    Cervical / Trunk Assessment Cervical / Trunk Assessment: Back Surgery  Communication   Communication Communication: No apparent difficulties  Cognition Arousal: Alert Behavior During Therapy: WFL for tasks assessed/performed Overall Cognitive Status: Within Functional Limits for tasks assessed                                          General Comments General comments (skin integrity, edema, etc.): VSS on RA    Exercises     Assessment/Plan    PT Assessment Patient does not need any further PT services  PT Problem List         PT Treatment Interventions      PT Goals (Current goals can be found in the Care Plan section)  Acute Rehab PT Goals Patient Stated Goal: home today PT Goal Formulation: All assessment and education complete, DC therapy    Frequency       Co-evaluation               AM-PAC PT "6 Clicks" Mobility  Outcome Measure Help needed turning from your back to your side while in a flat bed without using bedrails?: None Help needed moving from lying on your back to sitting on the side of a flat bed without using bedrails?: None Help needed moving to and from a bed to a chair (including a wheelchair)?: None Help needed standing up from a chair using your arms (e.g., wheelchair or bedside chair)?: None Help needed to walk in hospital room?: A Little Help needed climbing 3-5 steps with a railing? : A Little 6 Click Score: 22    End of Session Equipment Utilized During Treatment: Gait belt;Back brace Activity Tolerance: Patient tolerated treatment well Patient left: in bed;with call bell/phone within reach Nurse Communication: Mobility status PT Visit Diagnosis: Difficulty in walking, not elsewhere classified (R26.2)    Time: 2956-2130 PT Time Calculation (min) (ACUTE ONLY): 15 min   Charges:   PT Evaluation $PT Eval Low Complexity: 1 Low   PT General Charges $$ ACUTE PT VISIT: 1 Visit          Ferd Glassing., PT  Office # [phone]   Ilda Foil 10/23/2022, 8:38 AM

## 2022-10-23 NOTE — Discharge Summary (Signed)
Physician Discharge Summary  Patient ID: Carolyn Graham MRN: [id number] DOB/AGE: 1955/09/07 67 y.o.  Admit date: 10/22/2022 Discharge date: 10/23/2022  Admission Diagnoses: Spondylolisthesis L4-L5 with lumbar radiculopathy history of fusion L2-L4  Discharge Diagnoses: Spondylolisthesis L4-L5 with lumbar radiculopathy history of fusion L2-L4.  Lumbar spinal stenosis.  Neurogenic claudication.  Diabetes mellitus. Principal Problem:   Spondylolisthesis at L4-L5 level   Discharged Condition: good  Hospital Course: Patient underwent surgical decompression and fusion of L4-L5 which she tolerated well.  Consults: None  Significant Diagnostic Studies: None  Treatments: surgery: See op note  Discharge Exam: Blood pressure 101/66, pulse 75, temperature 97.8 F (36.6 C), temperature source Oral, resp. rate 19, height 5\' 1"  (1.549 m), weight 99.8 kg, SpO2 96%. Incision is clean and dry Station and gait are intact.  Disposition: Discharge disposition: 01-Home or Self Care       Discharge Instructions     Call MD for:  redness, tenderness, or signs of infection (pain, swelling, redness, odor or green/yellow discharge around incision site)   Complete by: As directed    Call MD for:  severe uncontrolled pain   Complete by: As directed    Call MD for:  temperature >100.4   Complete by: As directed    Diet - low sodium heart healthy   Complete by: As directed    Discharge instructions   Complete by: As directed    Okay to shower. Do not apply salves or appointments to incision. No heavy lifting with the upper extremities greater than 10 pounds. May resume driving when not requiring pain medication and patient feels comfortable with doing so.   Incentive spirometry RT   Complete by: As directed    Increase activity slowly   Complete by: As directed       Allergies as of 10/23/2022       Reactions   Codeine Nausea And Vomiting   Darvocet [propoxyphene N-acetaminophen] Nausea Only         Medication List     STOP taking these medications    MAGNESIUM PO       TAKE these medications    alendronate 70 MG tablet Commonly known as: FOSAMAX Take 70 mg by mouth once a week. Takes on saturday   b complex vitamins capsule Take 1 capsule by mouth daily.   bisoprolol-hydrochlorothiazide 2.5-6.25 MG tablet Commonly known as: ZIAC Take 1 tablet by mouth daily.   CALCIUM CITRATE PO Take 1 tablet by mouth daily.   cephALEXin 500 MG capsule Commonly known as: KEFLEX Take 1 capsule (500 mg total) by mouth 3 (three) times daily for 10 days.   cyanocobalamin 1000 MCG tablet Commonly known as: VITAMIN B12 Take 1,000 mcg by mouth daily.   diclofenac Sodium 1 % Gel Commonly known as: VOLTAREN Apply 2-4 grams to affected joint 4 times daily as needed.   DULoxetine 30 MG capsule Commonly known as: CYMBALTA Take 30 mg by mouth at bedtime. Takes at night   ferrous sulfate 325 (65 FE) MG EC tablet Take 325 mg by mouth daily.   hydrocortisone cream 1 % Apply 1 Application topically daily.   HYDROmorphone 2 MG tablet Commonly known as: Dilaudid Take 1 tablet (2 mg total) by mouth every 4 (four) hours as needed for up to 5 days for severe pain.   metFORMIN 750 MG 24 hr tablet Commonly known as: GLUCOPHAGE-XR Take 750 mg by mouth in the morning and at bedtime.   methocarbamol 500 MG tablet Commonly known  as: ROBAXIN Take 1 tablet (500 mg total) by mouth every 6 (six) hours as needed for muscle spasms.   omeprazole 20 MG capsule Commonly known as: PRILOSEC Take 20 mg by mouth daily   Potassium Gluconate 550 MG Tabs Take 550 mg by mouth every other day.   pramipexole 0.5 MG tablet Commonly known as: MIRAPEX Take 1.5 mg by mouth at bedtime.   rosuvastatin 10 MG tablet Commonly known as: CRESTOR Take 10 mg by mouth daily.   SUMAtriptan 100 MG tablet Commonly known as: IMITREX Take 100 mg by mouth every 2 (two) hours as needed for migraine or  headache.   traMADol 50 MG tablet Commonly known as: ULTRAM TAKE 1 TABLET(50 MG) BY MOUTH AT BEDTIME AS NEEDED   Trulicity 0.75 MG/0.5ML Sopn Generic drug: Dulaglutide Inject 0.75 mg into the skin every 7 (seven) days. Takes on Wednesday   valACYclovir 1000 MG tablet Commonly known as: VALTREX Take 1,000 mg by mouth daily as needed (for fever blisters).   Vitamin D 50 MCG (2000 UT) Caps Take 2,000 Units daily by mouth.   VITAMIN D2 + K1 PO Take 1 tablet by mouth daily.   zolpidem 5 MG tablet Commonly known as: AMBIEN TAKE 1 TABLET BY MOUTH AT BEDTIME AS NEEDED FOR SLEEP         Signed: Stefani Dama 10/23/2022, 8:26 AM

## 2022-10-23 NOTE — Plan of Care (Signed)

## 2022-10-29 ENCOUNTER — Other Ambulatory Visit: Payer: Self-pay | Admitting: Rheumatology

## 2022-10-29 MED FILL — Heparin Sodium (Porcine) Inj 1000 Unit/ML: INTRAMUSCULAR | Qty: 30 | Status: AC

## 2022-10-29 MED FILL — Sodium Chloride IV Soln 0.9%: INTRAVENOUS | Qty: 1000 | Status: AC

## 2022-10-30 NOTE — Telephone Encounter (Signed)
Last Fill: 09/30/2022  UDS:06/25/2022 UDS is consistent with treatment   Narc Agreement: 06/25/2022  Next Visit: 11/17/2022  Last Visit: 06/25/2022  Dx: Fibromyalgia   Current Dose per office note on on 06/25/2022:Ambien 5 mg at bedtime,tramadol 50 mg 1 tablet daily for pain relief.   I called patient and advised that refills would not be sent in until Monday, 11/02/2022. Patient verbalized understanding.   Okay to refill tramdol and ambien?

## 2022-11-02 MED ORDER — ZOLPIDEM TARTRATE 5 MG PO TABS: ORAL_TABLET | ORAL | 0 refills | Status: DC

## 2022-11-02 MED ORDER — TRAMADOL HCL 50 MG PO TABS: ORAL_TABLET | ORAL | 0 refills | Status: DC

## 2022-11-03 NOTE — Progress Notes (Deleted)
Office Visit Note  Patient: Carolyn Graham             Date of Birth: 1955-05-22           MRN: [id number]             PCP: Dorothey Baseman, MD Referring: Dorothey Baseman, MD Visit Date: 11/17/2022 Occupation: @GUAROCC @  Subjective:  No chief complaint on file.   History of Present Illness: Carolyn Graham is a 67 y.o. female ***     Activities of Daily Living:  Patient reports morning stiffness for *** {minute/hour:19697}.   Patient {ACTIONS;DENIES/REPORTS:21021675::"Denies"} nocturnal pain.  Difficulty dressing/grooming: {ACTIONS;DENIES/REPORTS:21021675::"Denies"} Difficulty climbing stairs: {ACTIONS;DENIES/REPORTS:21021675::"Denies"} Difficulty getting out of chair: {ACTIONS;DENIES/REPORTS:21021675::"Denies"} Difficulty using hands for taps, buttons, cutlery, and/or writing: {ACTIONS;DENIES/REPORTS:21021675::"Denies"}  No Rheumatology ROS completed.   PMFS History:  Patient Active Problem List   Diagnosis Date Noted   Spondylolisthesis at L4-L5 level 10/22/2022   OSA on CPAP 01/27/2021   Diabetes mellitus without complication (HCC) 06/13/2018   Primary osteoarthritis of both hands 08/30/2017   Primary osteoarthritis of both knees 08/30/2017   DDD (degenerative disc disease), cervical 08/30/2017   DDD (degenerative disc disease), lumbar 08/30/2017   History of insomnia 08/30/2017   Fatty liver 08/30/2017   History of TIA (transient ischemic attack) 08/30/2017   S/p bilateral carpal tunnel release 08/30/2017   History of gastroesophageal reflux (GERD) 08/30/2017   History of vitamin D deficiency 08/30/2017   Spondylolisthesis of lumbar region 01/12/2017   Benign essential hypertension 05/06/2016   Oral herpes 05/06/2016   Carpal tunnel syndrome of left wrist 11/08/2015   Hyperlipidemia, unspecified 05/10/2015   Osteopenia 11/05/2014   Closed fracture of fibula 01/09/2014   Sleep apnea 01/06/2011   Restless leg syndrome 01/06/2011   Insomnia 01/06/2011    Fibromyalgia 01/06/2011   GERD (gastroesophageal reflux disease) 12/15/2007    Past Medical History:  Diagnosis Date   Allergy    Anemia    Arthritis    Broken ankle 11/2013   right ankle   Cataract    Bilateral - surgery to remove   Diabetes mellitus without complication (HCC)    type 2   Fatty liver    Fever blister    Fibromyalgia 2005   GERD (gastroesophageal reflux disease)    uses prilosec   Headache(784.0)    not as often as before   Hypercholesteremia    Hypertension    Hyperthyroidism    in remission per patient, no meds   OSA (obstructive sleep apnea)    uses CPAP nightly   Plantar fasciitis    no current problems per patient as of 10/08/22   Post-operative nausea and vomiting    Restless leg syndrome    Schatzki's ring    Spondylolisthesis of lumbar region    Stress incontinence    Stroke (HCC) 2015   TIA.  unable to determine if this is true.   Thyroiditis    Vertigo     Family History  Problem Relation Age of Onset   Colon cancer Mother 62   Lung cancer Father 53   Colon cancer Maternal Grandfather    Colon polyps Maternal Aunt    Breast cancer Neg Hx    Esophageal cancer Neg Hx    Stomach cancer Neg Hx    Rectal cancer Neg Hx    Past Surgical History:  Procedure Laterality Date   CARPAL TUNNEL RELEASE Left 12/18/2015   Procedure: CARPAL TUNNEL RELEASE;  Surgeon: Erin Sons, MD;  Location: [facility];  Service: Orthopedics;  Laterality: Left;   CARPAL TUNNEL RELEASE Right 2018   CATARACT EXTRACTION W/PHACO Right 11/13/2015   Procedure: CATARACT EXTRACTION PHACO AND INTRAOCULAR LENS PLACEMENT (IOC);  Surgeon: Sallee Lange, MD;  Location: [facility];  Service: Ophthalmology;  Laterality: Right;  [place] 01:08AP% 21.6CDE 26.99Fluid pack lot # [id number]   CATARACT EXTRACTION W/PHACO Left 12/04/2015   Procedure: CATARACT EXTRACTION PHACO AND INTRAOCULAR LENS PLACEMENT (IOC);  Surgeon: Sallee Lange, MD;  Location: [facility];  Service: Ophthalmology;   Laterality: Left;  [place] 59.2 AP% 24.4 CDE 29.2 Fluid Pack lot # [id number]   CERVICAL DISC SURGERY     cervical decompression c4-5 and c5-6   COLONOSCOPY W/ BIOPSIES AND POLYPECTOMY     CTR     right hand   EYE SURGERY Bilateral    cataract    LUMBAR SPINE SURGERY  2018   SHOULDER SURGERY     right shoulder   TRIGGER FINGER RELEASE     right finger   VAGINA SURGERY  04/2022   vaginal tear repair   WISDOM TOOTH EXTRACTION     Social History   Social History Narrative   Married to [place], no natural children   Right handed   Bachelor's degree   2-3 cups daily         Immunization History  Administered Date(s) Administered   Influenza Inj Mdck Quad Pf 12/11/2016   Influenza Inj Mdck Quad With Preservative 12/13/2017   Influenza,inj,Quad PF,6+ Mos 10/29/2018   Influenza-Unspecified 02/15/2015, 01/01/2016, 12/02/2016, 12/22/2021   Moderna Covid-19 Vaccine Bivalent Booster 74yrs & up 12/24/2021   PFIZER(Purple Top)SARS-COV-2 Vaccination 05/11/2019, 06/06/2019, 12/11/2019   Tdap 11/14/2007, 04/14/2017     Objective: Vital Signs: There were no vitals taken for this visit.   Physical Exam   Musculoskeletal Exam: ***  CDAI Exam: CDAI Score: -- Patient Global: --; Provider Global: -- Swollen: --; Tender: -- Joint Exam 11/17/2022   No joint exam has been documented for this visit   There is currently no information documented on the homunculus. Go to the Rheumatology activity and complete the homunculus joint exam.  Investigation: No additional findings.  Imaging: DG Lumbar Spine 2-3 Views  Result Date: 10/22/2022 CLINICAL DATA:  [id number] Surgery, elective [id number] EXAM: LUMBAR SPINE - 2-3 VIEW COMPARISON:  09/04/2022 FINDINGS: Two fluoroscopic images are obtained during the performance of the procedure and are provided for interpretation only. Previous discectomies and posterior fusion spanning L2-L4. Interval extension of the fusion hardware to the L5 vertebral body,  with interval discectomy at L4-5. Alignment is near anatomic. Please refer to the operative report. Fluoroscopy time: 5.5 seconds, 4.23 mGy IMPRESSION: 1. L2-L5 discectomy and posterior fusion as above. Electronically Signed   By: Sharlet Salina M.D.   On: 10/22/2022 15:59   DG C-Arm 1-60 Min-No Report  Result Date: 10/22/2022 Fluoroscopy was utilized by the requesting physician.  No radiographic interpretation.    Recent Labs: Lab Results  Component Value Date   WBC 12.6 (H) 10/23/2022   HGB 8.6 (L) 10/23/2022   PLT 173 10/23/2022   NA 135 10/23/2022   K 3.8 10/23/2022   CL 103 10/23/2022   CO2 21 (L) 10/23/2022   GLUCOSE 122 (H) 10/23/2022   BUN 14 10/23/2022   CREATININE 0.92 10/23/2022   BILITOT 0.2 (L) 10/08/2022   ALKPHOS 67 10/08/2022   AST 51 (H) 10/08/2022   ALT 53 (H) 10/08/2022   PROT 7.7 10/08/2022   ALBUMIN 3.7 10/08/2022  CALCIUM 8.6 (L) 10/23/2022   GFRAA 89 08/04/2017    Speciality Comments: No specialty comments available.  Procedures:  No procedures performed Allergies: Codeine and Darvocet [propoxyphene n-acetaminophen]   Assessment / Plan:     Visit Diagnoses: Primary osteoarthritis of both hands  Primary osteoarthritis of both knees  DDD (degenerative disc disease), cervical  DDD (degenerative disc disease), lumbar  Medication monitoring encounter  Fibromyalgia  Osteopenia of multiple sites  History of vitamin D deficiency  History of recent fall  History of diabetes mellitus, type II  History of TIA (transient ischemic attack)  Fatty liver  History of gastroesophageal reflux (GERD)  RLS (restless legs syndrome)  History of migraine  History of sleep apnea  History of hypertension  Orders: No orders of the defined types were placed in this encounter.  No orders of the defined types were placed in this encounter.   Face-to-face time spent with patient was *** minutes. Greater than 50% of time was spent in counseling and  coordination of care.  Follow-Up Instructions: No follow-ups on file.   Gearldine Bienenstock, PA-C  Note - This record has been created using Dragon software.  Chart creation errors have been sought, but may not always  have been located. Such creation errors do not reflect on  the standard of medical care.

## 2022-11-17 ENCOUNTER — Ambulatory Visit: Payer: BLUE CROSS/BLUE SHIELD | Admitting: Physician Assistant

## 2022-11-17 DIAGNOSIS — Z8719 Personal history of other diseases of the digestive system: Secondary | ICD-10-CM

## 2022-11-17 DIAGNOSIS — Z8639 Personal history of other endocrine, nutritional and metabolic disease: Secondary | ICD-10-CM

## 2022-11-17 DIAGNOSIS — Z8669 Personal history of other diseases of the nervous system and sense organs: Secondary | ICD-10-CM

## 2022-11-17 DIAGNOSIS — M5030 Other cervical disc degeneration, unspecified cervical region: Secondary | ICD-10-CM

## 2022-11-17 DIAGNOSIS — Z5181 Encounter for therapeutic drug level monitoring: Secondary | ICD-10-CM

## 2022-11-17 DIAGNOSIS — M5136 Other intervertebral disc degeneration, lumbar region: Secondary | ICD-10-CM

## 2022-11-17 DIAGNOSIS — M797 Fibromyalgia: Secondary | ICD-10-CM

## 2022-11-17 DIAGNOSIS — Z8673 Personal history of transient ischemic attack (TIA), and cerebral infarction without residual deficits: Secondary | ICD-10-CM

## 2022-11-17 DIAGNOSIS — M170 Bilateral primary osteoarthritis of knee: Secondary | ICD-10-CM

## 2022-11-17 DIAGNOSIS — K760 Fatty (change of) liver, not elsewhere classified: Secondary | ICD-10-CM

## 2022-11-17 DIAGNOSIS — G2581 Restless legs syndrome: Secondary | ICD-10-CM

## 2022-11-17 DIAGNOSIS — M8589 Other specified disorders of bone density and structure, multiple sites: Secondary | ICD-10-CM

## 2022-11-17 DIAGNOSIS — M19041 Primary osteoarthritis, right hand: Secondary | ICD-10-CM

## 2022-11-17 DIAGNOSIS — Z9181 History of falling: Secondary | ICD-10-CM

## 2022-11-17 DIAGNOSIS — Z8679 Personal history of other diseases of the circulatory system: Secondary | ICD-10-CM

## 2022-12-03 ENCOUNTER — Other Ambulatory Visit: Payer: Self-pay | Admitting: *Deleted

## 2022-12-03 MED ORDER — TRAMADOL HCL 50 MG PO TABS: ORAL_TABLET | ORAL | 0 refills | Status: DC

## 2022-12-03 MED ORDER — ZOLPIDEM TARTRATE 5 MG PO TABS: ORAL_TABLET | ORAL | 0 refills | Status: DC

## 2022-12-03 NOTE — Telephone Encounter (Signed)
Last Fill: 11/02/2022  Next Visit: 01/14/2023  Last Visit: 06/25/2022  Dx: Fibromyalgia   Current Dose per office note on 06/25/2022: Ambien 5 mg at bedtime,tramadol 50 mg 1 tablet daily for pain relief.   Okay to refill Ambien and Tramadol?

## 2022-12-30 ENCOUNTER — Other Ambulatory Visit: Payer: Self-pay | Admitting: Rheumatology

## 2022-12-31 NOTE — Progress Notes (Unsigned)
Office Visit Note  Patient: Carolyn Graham             Date of Birth: 07/15/1955           MRN: [id number]             PCP: Dorothey Baseman, MD Referring: Dorothey Baseman, MD Visit Date: 01/14/2023 Occupation: @GUAROCC @  Subjective:  Pain in both knees   History of Present Illness: Carolyn Graham is a 67 y.o. female with history of osteoarthritis, fibromyalgia, and DDD.  Patient underwent L4-L5 lumbar fusion on 10/22/2022 performed by Dr. Danielle Dess.  Her recovery went smoothly without any complications.  She took tramadol during the postoperative period.  She denies any recent fibromyalgia flares.  She continues to have myofascial pain but her symptoms have been manageable.  She presents today with increased pain involving both knees, left greater than right.  She denies any joint swelling.  Patient requested a left knee joint cortisone injection today.  Activities of Daily Living:  Patient reports morning stiffness for all day. Patient Reports nocturnal pain.  Difficulty dressing/grooming: Denies Difficulty climbing stairs: Reports Difficulty getting out of chair: Denies Difficulty using hands for taps, buttons, cutlery, and/or writing: Denies  Review of Systems  Constitutional:  Positive for fatigue.  HENT:  Positive for mouth sores and mouth dryness.   Eyes:  Negative for dryness.  Respiratory:  Negative for shortness of breath.   Cardiovascular:  Negative for chest pain and palpitations.  Gastrointestinal:  Negative for blood in stool, constipation and diarrhea.  Endocrine: Negative for increased urination.  Genitourinary:  Negative for involuntary urination.  Musculoskeletal:  Positive for joint pain, joint pain, joint swelling, myalgias, muscle weakness, morning stiffness, muscle tenderness and myalgias.  Skin:  Negative for color change, rash, hair loss and sensitivity to sunlight.  Allergic/Immunologic: Negative for susceptible to infections.  Neurological:  Negative for  dizziness and headaches.  Hematological:  Negative for swollen glands.  Psychiatric/Behavioral:  Positive for sleep disturbance. Negative for depressed mood. The patient is not nervous/anxious.     PMFS History:  Patient Active Problem List   Diagnosis Date Noted   Spondylolisthesis at L4-L5 level 10/22/2022   OSA on CPAP 01/27/2021   Diabetes mellitus without complication (HCC) 06/13/2018   Primary osteoarthritis of both hands 08/30/2017   Primary osteoarthritis of both knees 08/30/2017   DDD (degenerative disc disease), cervical 08/30/2017   DDD (degenerative disc disease), lumbar 08/30/2017   History of insomnia 08/30/2017   Fatty liver 08/30/2017   History of TIA (transient ischemic attack) 08/30/2017   S/p bilateral carpal tunnel release 08/30/2017   History of gastroesophageal reflux (GERD) 08/30/2017   History of vitamin D deficiency 08/30/2017   Spondylolisthesis of lumbar region 01/12/2017   Benign essential hypertension 05/06/2016   Oral herpes 05/06/2016   Carpal tunnel syndrome of left wrist 11/08/2015   Hyperlipidemia, unspecified 05/10/2015   Osteopenia 11/05/2014   Closed fracture of fibula 01/09/2014   Sleep apnea 01/06/2011   Restless leg syndrome 01/06/2011   Insomnia 01/06/2011   Fibromyalgia 01/06/2011   GERD (gastroesophageal reflux disease) 12/15/2007    Past Medical History:  Diagnosis Date   Allergy    Anemia    Arthritis    Broken ankle 11/2013   right ankle   Cataract    Bilateral - surgery to remove   Diabetes mellitus without complication (HCC)    type 2   Fatty liver    Fever blister    Fibromyalgia 2005  GERD (gastroesophageal reflux disease)    uses prilosec   Headache(784.0)    not as often as before   Hypercholesteremia    Hypertension    Hyperthyroidism    in remission per patient, no meds   OSA (obstructive sleep apnea)    uses CPAP nightly   Plantar fasciitis    no current problems per patient as of 10/08/22    Post-operative nausea and vomiting    Restless leg syndrome    Schatzki's ring    Spondylolisthesis of lumbar region    Stress incontinence    Stroke (HCC) 2015   TIA.  unable to determine if this is true.   Thyroiditis    Vertigo     Family History  Problem Relation Age of Onset   Colon cancer Mother 65   Lung cancer Father 60   Colon polyps Maternal Aunt    Colon cancer Maternal Grandfather    Breast cancer Neg Hx    Esophageal cancer Neg Hx    Stomach cancer Neg Hx    Rectal cancer Neg Hx    Past Surgical History:  Procedure Laterality Date   CARPAL TUNNEL RELEASE Left 12/18/2015   Procedure: CARPAL TUNNEL RELEASE;  Surgeon: Erin Sons, MD;  Location: [facility];  Service: Orthopedics;  Laterality: Left;   CARPAL TUNNEL RELEASE Right 2018   CATARACT EXTRACTION W/PHACO Right 11/13/2015   Procedure: CATARACT EXTRACTION PHACO AND INTRAOCULAR LENS PLACEMENT (IOC);  Surgeon: Sallee Lange, MD;  Location: [facility];  Service: Ophthalmology;  Laterality: Right;  [place] 01:08AP% 21.6CDE 26.99Fluid pack lot # [id number]   CATARACT EXTRACTION W/PHACO Left 12/04/2015   Procedure: CATARACT EXTRACTION PHACO AND INTRAOCULAR LENS PLACEMENT (IOC);  Surgeon: Sallee Lange, MD;  Location: [facility];  Service: Ophthalmology;  Laterality: Left;  [place] 59.2 AP% 24.4 CDE 29.2 Fluid Pack lot # [id number]   CERVICAL DISC SURGERY     cervical decompression c4-5 and c5-6   COLONOSCOPY W/ BIOPSIES AND POLYPECTOMY     CTR     right hand   EYE SURGERY Bilateral    cataract    LUMBAR FUSION  10/22/2022   L4-L5   LUMBAR SPINE SURGERY  2018   SHOULDER SURGERY     right shoulder   TRIGGER FINGER RELEASE     right finger   VAGINA SURGERY  04/2022   vaginal tear repair   WISDOM TOOTH EXTRACTION     Social History   Social History Narrative   Married to [place], no natural children   Right handed   Bachelor's degree   2-3 cups daily         Immunization History  Administered Date(s)  Administered   Influenza Inj Mdck Quad Pf 12/11/2016   Influenza Inj Mdck Quad With Preservative 12/13/2017   Influenza,inj,Quad PF,6+ Mos 10/29/2018   Influenza-Unspecified 02/15/2015, 01/01/2016, 12/02/2016, 12/22/2021   Moderna Covid-19 Vaccine Bivalent Booster 5yrs & up 12/24/2021   PFIZER(Purple Top)SARS-COV-2 Vaccination 05/11/2019, 06/06/2019, 12/11/2019   Tdap 11/14/2007, 04/14/2017     Objective: Vital Signs: BP 133/81 (BP Location: Left Arm, Patient Position: Sitting, Cuff Size: Normal)   Pulse 75   Ht 5\' 1"  (1.549 m)   Wt 213 lb 3.2 oz (96.7 kg)   BMI 40.28 kg/m    Physical Exam Vitals and nursing note reviewed.  Constitutional:      Appearance: She is well-developed.  HENT:     Head: Normocephalic and atraumatic.  Eyes:     Conjunctiva/sclera: Conjunctivae normal.  Cardiovascular:  Rate and Rhythm: Normal rate and regular rhythm.     Heart sounds: Normal heart sounds.  Pulmonary:     Effort: Pulmonary effort is normal.     Breath sounds: Normal breath sounds.  Abdominal:     General: Bowel sounds are normal.     Palpations: Abdomen is soft.  Musculoskeletal:     Cervical back: Normal range of motion.  Lymphadenopathy:     Cervical: No cervical adenopathy.  Skin:    General: Skin is warm and dry.     Capillary Refill: Capillary refill takes less than 2 seconds.  Neurological:     Mental Status: She is alert and oriented to person, place, and time.  Psychiatric:        Behavior: Behavior normal.      Musculoskeletal Exam: C-spine has good ROM. Limited mobility of lumbar spine.  Shoulder joints, elbow joints, wrist joints, Mcps, PIPs, and DIPs good ROM with no synovitis.  PIP and DIP thickening.  Mild tenderness of the right 3rd PIP joint.  Hip joints have good ROM with no groin pain.  Discomfort with ROM of both knees. No warmth or effusion noted.  Ankle joints have good ROM with no tenderness or swelling.    CDAI Exam: CDAI Score: -- Patient Global:  --; Provider Global: -- Swollen: --; Tender: -- Joint Exam 01/14/2023   No joint exam has been documented for this visit   There is currently no information documented on the homunculus. Go to the Rheumatology activity and complete the homunculus joint exam.  Investigation: No additional findings.  Imaging: No results found.  Recent Labs: Lab Results  Component Value Date   WBC 12.6 (H) 10/23/2022   HGB 8.6 (L) 10/23/2022   PLT 173 10/23/2022   NA 135 10/23/2022   K 3.8 10/23/2022   CL 103 10/23/2022   CO2 21 (L) 10/23/2022   GLUCOSE 122 (H) 10/23/2022   BUN 14 10/23/2022   CREATININE 0.92 10/23/2022   BILITOT 0.2 (L) 10/08/2022   ALKPHOS 67 10/08/2022   AST 51 (H) 10/08/2022   ALT 53 (H) 10/08/2022   PROT 7.7 10/08/2022   ALBUMIN 3.7 10/08/2022   CALCIUM 8.6 (L) 10/23/2022   GFRAA 89 08/04/2017    Speciality Comments: No specialty comments available.  Procedures:  Large Joint Inj: L knee on 01/14/2023 2:21 PM Indications: pain Details: 27 G 1.5 in needle, medial approach  Arthrogram: No  Medications: 1.5 mL lidocaine 1 %; 40 mg triamcinolone acetonide 40 MG/ML Aspirate: 0 mL Outcome: tolerated well, no immediate complications Procedure, treatment alternatives, risks and benefits explained, specific risks discussed. Consent was given by the patient. Immediately prior to procedure a time out was called to verify the correct patient, procedure, equipment, support staff and site/side marked as required. Patient was prepped and draped in the usual sterile fashion.     Allergies: Codeine and Darvocet [propoxyphene n-acetaminophen]   Assessment / Plan:     Visit Diagnoses: Primary osteoarthritis of both hands: She has PIP and DIP thickening consistent with osteoarthritis of both hands.  No synovitis noted on examination today.  Complete fist formation bilaterally.  Discussed the importance of joint protection and muscle strengthening.  Primary osteoarthritis of  both knees: He has good range of motion of both knee joints on examination today.  No warmth or effusion noted.  She has been experiencing increased discomfort in both knee joints, left greater than right.  She had a left knee joint cortisone injection performed on  07/03/2021 which righted significant relief.  She previously underwent viscosupplementation for both knees in 2020.  Discussed that I would recommend proceeding with viscosupplementation for both knees again as she will be leaving Sunday for 5 months to live in [place].  Patient requested a left knee joint cortisone injection today to alleviate her current symptoms.  Patient was encouraged to monitor blood glucose and blood pressure closely following the cortisone injection today. She tolerated the procedure well.   Procedure note was completed above.  Aftercare was discussed.  She was advised to notify us if her symptoms persist or worsen.  She will notify us at the end of March if she would like for [place] to apply for viscosupplementation for both knees.  Chronic pain of left knee: Patient presents today with increased pain involving the left knee joint.  No recent injury or fall.  She has good range of motion of the left knee on examination today.  No warmth or effusion noted.  Patient had a left knee joint cortisone injection performed on 07/03/2021 which righted significant relief but her symptoms have recurred.  She previously underwent viscosupplementation for both knees.  Offered to reapply for viscosupplementation but she will be traveling to [place] for the next 5 months.  Patient requested a left knee joint cortisone injection today.  She tolerated the procedure well.  Procedure note was completed above.  Aftercare was discussed.  DDD (degenerative disc disease), cervical: C-spine has good range of motion.  No symptoms of radiculopathy at this time.  Degeneration of intervertebral disc of lumbar region without discogenic back pain or lower  extremity pain: Underwent posterior lumbar fusion L4-L5 on 10/22/2022 performed by Dr. Danielle Dess.  Fibromyalgia: Patient continues to experience intermittent rashes and muscle tenderness due to fibromyalgia.  Overall her symptoms have been stable.  She remains on Cymbalta 30 mg 1 capsule daily and takes tramadol 50 mg 1 tablet at bedtime as needed for pain relief.  Osteopenia of multiple sites - DEXA updated on 08/18/21.  She is taking a calcium and vitamin D supplement daily.  She remains on Fosamax 70 mg 1 tablet by mouth once weekly.  History of vitamin D deficiency: She is taking vitamin D 2000 units daily.  Medication monitoring encounter -UDS and narcotic agreement will be updated today on 01/14/2023.  Plan: DRUG MONITOR, TRAMADOL,QN, URINE, DRUG MONITOR, PANEL 5, W/CONF, URINE  Other medical conditions are listed as follows:   History of diabetes mellitus, type II: Discussed the importance of monitoring her blood glucose closely following the cortisone injection today.  Recommended proceeding with viscosupplementation for both knees in the future.  History of TIA (transient ischemic attack)  Fatty liver  History of gastroesophageal reflux (GERD)  RLS (restless legs syndrome)  History of migraine  History of sleep apnea  History of hypertension  Orders: Orders Placed This Encounter  Procedures   Large Joint Inj: L knee   DRUG MONITOR, TRAMADOL,QN, URINE   DRUG MONITOR, PANEL 5, W/CONF, URINE   Meds ordered this encounter  Medications   traMADol (ULTRAM) 50 MG tablet    Sig: TAKE 1 TABLET(50 MG) BY MOUTH AT BEDTIME AS NEEDED    Dispense:  30 tablet    Refill:  0     Follow-Up Instructions: Return in about 6 months (around 07/14/2023) for Osteoarthritis, Fibromyalgia.   Gearldine Bienenstock, PA-C  Note - This record has been created using Dragon software.  Chart creation errors have been sought, but may not always  have  been located. Such creation errors do not reflect on   the standard of medical care.

## 2022-12-31 NOTE — Telephone Encounter (Signed)
Last Fill: 12/03/2022  Next Visit: 01/14/2023  Last Visit: 06/25/2022  Dx: Fibromyalgia   Current Dose per office note on 06/25/2022: Ambien 5 mg at bedtime   Okay to refill Ambien?

## 2023-01-14 ENCOUNTER — Ambulatory Visit: Payer: Medicare Other | Attending: Physician Assistant | Admitting: Physician Assistant

## 2023-01-14 ENCOUNTER — Encounter: Payer: Self-pay | Admitting: Physician Assistant

## 2023-01-14 VITALS — BP 133/81 | HR 75 | Ht 61.0 in | Wt 213.2 lb

## 2023-01-14 DIAGNOSIS — M25562 Pain in left knee: Secondary | ICD-10-CM | POA: Insufficient documentation

## 2023-01-14 DIAGNOSIS — Z9181 History of falling: Secondary | ICD-10-CM | POA: Insufficient documentation

## 2023-01-14 DIAGNOSIS — Z5181 Encounter for therapeutic drug level monitoring: Secondary | ICD-10-CM | POA: Diagnosis present

## 2023-01-14 DIAGNOSIS — Z8679 Personal history of other diseases of the circulatory system: Secondary | ICD-10-CM | POA: Insufficient documentation

## 2023-01-14 DIAGNOSIS — G8929 Other chronic pain: Secondary | ICD-10-CM | POA: Insufficient documentation

## 2023-01-14 DIAGNOSIS — M5030 Other cervical disc degeneration, unspecified cervical region: Secondary | ICD-10-CM | POA: Diagnosis not present

## 2023-01-14 DIAGNOSIS — Z8669 Personal history of other diseases of the nervous system and sense organs: Secondary | ICD-10-CM | POA: Insufficient documentation

## 2023-01-14 DIAGNOSIS — Z8639 Personal history of other endocrine, nutritional and metabolic disease: Secondary | ICD-10-CM | POA: Insufficient documentation

## 2023-01-14 DIAGNOSIS — G2581 Restless legs syndrome: Secondary | ICD-10-CM | POA: Diagnosis present

## 2023-01-14 DIAGNOSIS — M797 Fibromyalgia: Secondary | ICD-10-CM | POA: Insufficient documentation

## 2023-01-14 DIAGNOSIS — M19041 Primary osteoarthritis, right hand: Secondary | ICD-10-CM | POA: Diagnosis not present

## 2023-01-14 DIAGNOSIS — M170 Bilateral primary osteoarthritis of knee: Secondary | ICD-10-CM | POA: Diagnosis not present

## 2023-01-14 DIAGNOSIS — Z8719 Personal history of other diseases of the digestive system: Secondary | ICD-10-CM | POA: Diagnosis present

## 2023-01-14 DIAGNOSIS — M51369 Other intervertebral disc degeneration, lumbar region without mention of lumbar back pain or lower extremity pain: Secondary | ICD-10-CM | POA: Insufficient documentation

## 2023-01-14 DIAGNOSIS — M8589 Other specified disorders of bone density and structure, multiple sites: Secondary | ICD-10-CM | POA: Insufficient documentation

## 2023-01-14 DIAGNOSIS — Z8673 Personal history of transient ischemic attack (TIA), and cerebral infarction without residual deficits: Secondary | ICD-10-CM | POA: Diagnosis present

## 2023-01-14 DIAGNOSIS — M19042 Primary osteoarthritis, left hand: Secondary | ICD-10-CM | POA: Diagnosis present

## 2023-01-14 DIAGNOSIS — K760 Fatty (change of) liver, not elsewhere classified: Secondary | ICD-10-CM | POA: Diagnosis present

## 2023-01-14 MED ORDER — TRIAMCINOLONE ACETONIDE 40 MG/ML IJ SUSP
40.0000 mg | INTRAMUSCULAR | Status: AC | PRN
Start: 2023-01-14 — End: 2023-01-14
  Administered 2023-01-14: 40 mg via INTRA_ARTICULAR

## 2023-01-14 MED ORDER — TRAMADOL HCL 50 MG PO TABS: ORAL_TABLET | ORAL | 0 refills | Status: DC

## 2023-01-14 MED ORDER — LIDOCAINE HCL 1 % IJ SOLN
1.5000 mL | INTRAMUSCULAR | Status: AC | PRN
Start: 2023-01-14 — End: 2023-01-14
  Administered 2023-01-14: 1.5 mL

## 2023-01-14 NOTE — Patient Instructions (Signed)
Knee Exercises Ask your health care provider which exercises are safe for you. Do exercises exactly as told by your health care provider and adjust them as directed. It is normal to feel mild stretching, pulling, tightness, or discomfort as you do these exercises. Stop right away if you feel sudden pain or your pain gets worse. Do not begin these exercises until told by your health care provider. Stretching and range-of-motion exercises These exercises warm up your muscles and joints and improve the movement and flexibility of your knee. These exercises also help to relieve pain and swelling. Knee extension, prone  Lie on your abdomen (prone position) on a bed. Place your left / right knee just beyond the edge of the surface so your knee is not on the bed. You can put a towel under your left / right thigh just above your kneecap for comfort. Relax your leg muscles and allow gravity to straighten your knee (extension). You should feel a stretch behind your left / right knee. Hold this position for __________ seconds. Scoot up so your knee is supported between repetitions. Repeat __________ times. Complete this exercise __________ times a day. Knee flexion, active  Lie on your back with both legs straight. If this causes back discomfort, bend your left / right knee so your foot is flat on the floor. Slowly slide your left / right heel back toward your buttocks. Stop when you feel a gentle stretch in the front of your knee or thigh (flexion). Hold this position for __________ seconds. Slowly slide your left / right heel back to the starting position. Repeat __________ times. Complete this exercise __________ times a day. Quadriceps stretch, prone  Lie on your abdomen on a firm surface, such as a bed or padded floor. Bend your left / right knee and hold your ankle. If you cannot reach your ankle or pant leg, loop a belt around your foot and grab the belt instead. Gently pull your heel toward your  buttocks. Your knee should not slide out to the side. You should feel a stretch in the front of your thigh and knee (quadriceps). Hold this position for __________ seconds. Repeat __________ times. Complete this exercise __________ times a day. Hamstring, supine  Lie on your back (supine position). Loop a belt or towel over the ball of your left / right foot. The ball of your foot is on the walking surface, right under your toes. Straighten your left / right knee and slowly pull on the belt to raise your leg until you feel a gentle stretch behind your knee (hamstring). Do not let your knee bend while you do this. Keep your other leg flat on the floor. Hold this position for __________ seconds. Repeat __________ times. Complete this exercise __________ times a day. Strengthening exercises These exercises build strength and endurance in your knee. Endurance is the ability to use your muscles for a long time, even after they get tired. Quadriceps, isometric This exercise strengthens the muscles in front of your thigh (quadriceps) without moving your knee joint (isometric). Lie on your back with your left / right leg extended and your other knee bent. Put a rolled towel or small pillow under your knee if told by your health care provider. Slowly tense the muscles in the front of your left / right thigh. You should see your kneecap slide up toward your hip or see increased dimpling just above the knee. This motion will push the back of the knee toward the floor.  For __________ seconds, hold the muscle as tight as you can without increasing your pain. Relax the muscles slowly and completely. Repeat __________ times. Complete this exercise __________ times a day. Straight leg raises This exercise strengthens the muscles in front of your thigh (quadriceps) and the muscles that move your hips (hip flexors). Lie on your back with your left / right leg extended and your other knee bent. Tense the  muscles in the front of your left / right thigh. You should see your kneecap slide up or see increased dimpling just above the knee. Your thigh may even shake a bit. Keep these muscles tight as you raise your leg 4-6 inches (10-15 cm) off the floor. Do not let your knee bend. Hold this position for __________ seconds. Keep these muscles tense as you lower your leg. Relax your muscles slowly and completely after each repetition. Repeat __________ times. Complete this exercise __________ times a day. Hamstring, isometric  Lie on your back on a firm surface. Bend your left / right knee about __________ degrees. Dig your left / right heel into the surface as if you are trying to pull it toward your buttocks. Tighten the muscles in the back of your thighs (hamstring) to "dig" as hard as you can without increasing any pain. Hold this position for __________ seconds. Release the tension gradually and allow your muscles to relax completely for __________ seconds after each repetition. Repeat __________ times. Complete this exercise __________ times a day. Hamstring curls If told by your health care provider, do this exercise while wearing ankle weights. Begin with __________lb / kg weights. Then increase the weight by 1 lb (0.5 kg) increments. Do not wear ankle weights that are more than __________lb / kg. Lie on your abdomen with your legs straight. Bend your left / right knee as far as you can without feeling pain. Keep your hips flat against the floor. Hold this position for __________ seconds. Slowly lower your leg to the starting position. Repeat __________ times. Complete this exercise __________ times a day. Squats This exercise strengthens the muscles in front of your thigh and knee (quadriceps). Stand in front of a table, with your feet and knees pointing straight ahead. You may rest your hands on the table for balance but not for support. Slowly bend your knees and lower your hips like you  are going to sit in a chair. Keep your weight over your heels, not over your toes. Keep your lower legs upright so they are parallel with the table legs. Do not let your hips go lower than your knees. Do not bend lower than told by your health care provider. If your knee pain increases, do not bend as low. Hold the squat position for __________ seconds. Slowly push with your legs to return to standing. Do not use your hands to pull yourself to standing. Repeat __________ times. Complete this exercise __________ times a day. Wall slides This exercise strengthens the muscles in front of your thigh and knee (quadriceps). Lean your back against a smooth wall or door, and walk your feet out 18-24 inches (46-61 cm) from it. Place your feet hip-width apart. Slowly slide down the wall or door until your knees bend __________ degrees. Keep your knees over your heels, not over your toes. Keep your knees in line with your hips. Hold this position for __________ seconds. Repeat __________ times. Complete this exercise __________ times a day. Straight leg raises, side-lying This exercise strengthens the muscles that rotate  the leg at the hip and move it away from your body (hip abductors). Lie on your side with your left / right leg in the top position. Lie so your head, shoulder, knee, and hip line up. You may bend your bottom knee to help you keep your balance. Roll your hips slightly forward so your hips are stacked directly over each other and your left / right knee is facing forward. Leading with your heel, lift your top leg 4-6 inches (10-15 cm). You should feel the muscles in your outer hip lifting. Do not let your foot drift forward. Do not let your knee roll toward the ceiling. Hold this position for __________ seconds. Slowly return your leg to the starting position. Let your muscles relax completely after each repetition. Repeat __________ times. Complete this exercise __________ times a  day. Straight leg raises, prone This exercise stretches the muscles that move your hips away from the front of the pelvis (hip extensors). Lie on your abdomen on a firm surface. You can put a pillow under your hips if that is more comfortable. Tense the muscles in your buttocks and lift your left / right leg about 4-6 inches (10-15 cm). Keep your knee straight as you lift your leg. Hold this position for __________ seconds. Slowly lower your leg to the starting position. Let your leg relax completely after each repetition. Repeat __________ times. Complete this exercise __________ times a day. This information is not intended to replace advice given to you by your health care provider. Make sure you discuss any questions you have with your health care provider. Document Revised: 10/22/2020 Document Reviewed: 10/22/2020 Elsevier Patient Education  2024 [employer].

## 2023-01-16 LAB — DM TEMPLATE

## 2023-01-16 LAB — DRUG MONITOR, PANEL 5, W/CONF, URINE
Amphetamines: NEGATIVE ng/mL (ref <500)
Barbiturates: NEGATIVE ng/mL (ref <300)
Benzodiazepines: NEGATIVE ng/mL (ref <100)
Cocaine Metabolite: NEGATIVE ng/mL (ref <150)
Creatinine: 51.6 mg/dL (ref >=20.0)
Marijuana Metabolite: NEGATIVE ng/mL (ref <20)
Methadone Metabolite: NEGATIVE ng/mL (ref <100)
Opiates: NEGATIVE ng/mL (ref <100)
Oxidant: NEGATIVE ug/mL (ref <200)
Oxycodone: NEGATIVE ng/mL (ref <100)
pH: 6 (ref 4.5–9.0)

## 2023-01-16 LAB — DRUG MONITOR, TRAMADOL,QN, URINE
Desmethyltramadol: 730 ng/mL — ABNORMAL HIGH (ref <100)
Tramadol: 781 ng/mL — ABNORMAL HIGH (ref <100)

## 2023-01-18 NOTE — Progress Notes (Signed)
UDS is consistent with treatment.

## 2023-02-01 ENCOUNTER — Other Ambulatory Visit: Payer: Self-pay | Admitting: Rheumatology

## 2023-02-01 MED ORDER — ZOLPIDEM TARTRATE 5 MG PO TABS: ORAL_TABLET | ORAL | 0 refills | Status: DC

## 2023-02-01 NOTE — Telephone Encounter (Signed)
Last Fill: 12/31/2022  Next Visit: 07/15/2023  Last Visit: 01/14/2023  Dx: not discussed   Current Dose per office note on 01/14/2023: not discussed   Okay to refill Ambien?

## 2023-02-02 MED ORDER — ZOLPIDEM TARTRATE 5 MG PO TABS: ORAL_TABLET | ORAL | 0 refills | Status: DC

## 2023-02-02 NOTE — Addendum Note (Signed)
Addended by: Henriette Combs on: 02/02/2023 09:17 AM   Modules accepted: Orders

## 2023-02-02 NOTE — Telephone Encounter (Signed)
Cancelled prescription in [place].

## 2023-02-02 NOTE — Telephone Encounter (Signed)
Please cancel prescription sent to [facility] in [place].

## 2023-02-02 NOTE — Telephone Encounter (Signed)
Patient called back and left message stating that she needs to refill for Ambien to [facility], [place], not the [facility] in [place] where the refill request was sent from. Can you please resend prescription. Thanks!

## 2023-02-12 HISTORY — PX: KYPHOPLASTY: SHX5884

## 2023-02-26 ENCOUNTER — Other Ambulatory Visit: Payer: Self-pay | Admitting: *Deleted

## 2023-02-26 MED ORDER — TRAMADOL HCL 50 MG PO TABS: ORAL_TABLET | ORAL | 0 refills | Status: DC

## 2023-02-26 NOTE — Telephone Encounter (Signed)
 Patient contacted the office to request a refill on Tramadol  to go to the [facility], [place]  Last Fill: 01/14/2023  UDS:01/14/2023 UDS is consistent with treatment   Narc Agreement: 01/14/2023  Next Visit: 07/15/2023  Last Visit: 01/14/2023  Dx: Fibromyalgia   Current Dose per office note on 01/14/2020:tramadol  50 mg 1 tablet at bedtime as needed for pain relief.    Okay to refill Tramadol ?

## 2023-03-04 ENCOUNTER — Other Ambulatory Visit: Payer: Self-pay | Admitting: *Deleted

## 2023-03-04 MED ORDER — ZOLPIDEM TARTRATE 5 MG PO TABS: ORAL_TABLET | ORAL | 0 refills | Status: DC

## 2023-03-04 NOTE — Telephone Encounter (Signed)
 Patient contacted the office requesting a refill on Ambien.   Last Fill: 02/02/2023   Next Visit: 07/15/2023   Last Visit: 01/14/2023   Dx: not discussed    Current Dose per office note on 01/14/2023: not discussed    Okay to refill Ambien?

## 2023-04-05 ENCOUNTER — Other Ambulatory Visit: Payer: Self-pay | Admitting: *Deleted

## 2023-04-05 MED ORDER — TRAMADOL HCL 50 MG PO TABS: ORAL_TABLET | ORAL | 0 refills | Status: DC

## 2023-04-05 MED ORDER — ZOLPIDEM TARTRATE 5 MG PO TABS: ORAL_TABLET | ORAL | 0 refills | Status: DC

## 2023-04-05 NOTE — Telephone Encounter (Signed)
 Patient contacted the office to request a refill on Tramadol  and Ambien  to go to the [facility], [place]   Last Fill: 13/2025 (tramadol ), 03/04/2023 (Ambien )    UDS:01/14/2023 UDS is consistent with treatment    Narc Agreement: 01/14/2023   Next Visit: 07/15/2023   Last Visit: 01/14/2023   Dx: Fibromyalgia    Current Dose per office note on 01/14/2020:tramadol  50 mg 1 tablet at bedtime as needed for pain relief. Ambien  dose not discussed     Okay to refill Tramadol  and Ambien ?

## 2023-05-03 ENCOUNTER — Other Ambulatory Visit: Payer: Self-pay

## 2023-05-03 MED ORDER — ZOLPIDEM TARTRATE 5 MG PO TABS: ORAL_TABLET | ORAL | 0 refills | Status: DC

## 2023-05-03 MED ORDER — TRAMADOL HCL 50 MG PO TABS: ORAL_TABLET | ORAL | 0 refills | Status: DC

## 2023-05-03 NOTE — Telephone Encounter (Signed)
 Patient called and requested refills of Ambien and Tramadol to [facility] in [place]. [place], [place].   Last Fill: 04/05/2023 (both meds)  UDS:01/14/2023 UDS is consistent with treatment   Narc Agreement: 01/14/2023   Next Visit: 07/15/2023  Last Visit: 01/14/2023  Dx: Fibromyalgia   Current Dose per office note on 01/14/2023:tramadol 50 mg 1 tablet at bedtime as needed for pain relief.  Ambien dose not discussed.    Okay to refill tramadol and ambien?

## 2023-06-03 ENCOUNTER — Other Ambulatory Visit: Payer: Self-pay | Admitting: *Deleted

## 2023-06-03 MED ORDER — ZOLPIDEM TARTRATE 5 MG PO TABS: ORAL_TABLET | ORAL | 0 refills | Status: DC

## 2023-06-03 MED ORDER — TRAMADOL HCL 50 MG PO TABS: ORAL_TABLET | ORAL | 0 refills | Status: DC

## 2023-06-03 NOTE — Telephone Encounter (Signed)
 Patient contacted the office and requested a refill on Ambien and Tramadol to be sent to the [facility] In [place], [place].  Last Fill: 05/03/2023 (both meds)   UDS:01/14/2023 UDS is consistent with treatment    Narc Agreement: 01/14/2023    Next Visit: 07/15/2023   Last Visit: 01/14/2023   Dx: Fibromyalgia    Current Dose per office note on 01/14/2023:tramadol 50 mg 1 tablet at bedtime as needed for pain relief.  Ambien dose not discussed.      Okay to refill tramadol and ambien?

## 2023-06-10 ENCOUNTER — Other Ambulatory Visit: Payer: Self-pay | Admitting: Family Medicine

## 2023-06-10 DIAGNOSIS — Z1231 Encounter for screening mammogram for malignant neoplasm of breast: Secondary | ICD-10-CM

## 2023-07-01 NOTE — Progress Notes (Signed)
 Office Visit Note  Patient: Carolyn Graham             Date of Birth: August 02, 1955           MRN: [id number]             PCP: Rory Collard, MD Referring: Rory Collard, MD Visit Date: 07/15/2023 Occupation: @GUAROCC @  Subjective:  Pain in both knees and generalized pain  History of Present Illness: Carolyn Graham is a 69 y.o. female with osteoarthritis, degenerative disc disease and fibromyalgia syndrome.  She returns today after her last visit in November 2024.  She states she gradually recovered from her lumbar spine surgery in August 2024.  She had a fall in November 2024.  She had kyphoplasty for T12 compression fracture in December 2024.  She had good recovery from it.  She continues to have generalized pain and discomfort from fibromyalgia.  She is on tramadol  50 mg p.o. nightly as needed for pain management.  Patient reports that her pain without tramadol  on the scale of 0-10 is about 5.  She states with the tramadol  the pain level comes down to 3.  She states she is able to sleep with that amount of pain.  She is unable to sleep without tramadol .  She continues to have pain and discomfort in her knee joints.  She is scheduled to see an orthopedic surgeon tomorrow for possible left total knee replacement at [facility] clinic.  She states the cortisone injection to her left knee joint in November 2024 were effective.  She continues to have discomfort in her neck and lower back.  She has been off Fosamax since November 2024.  She has an appointment coming up with the endocrinologist to discuss treatment options.    Activities of Daily Living:  Patient reports morning stiffness for all day. Patient Reports nocturnal pain.  Difficulty dressing/grooming: Reports Difficulty climbing stairs: Reports Difficulty getting out of chair: Reports Difficulty using hands for taps, buttons, cutlery, and/or writing: Denies  Review of Systems  Constitutional:  Positive for fatigue.  HENT:  Positive  for mouth dryness.   Eyes:  Negative for dryness.  Respiratory:  Negative for difficulty breathing.   Cardiovascular:  Negative for chest pain and palpitations.  Gastrointestinal:  Negative for blood in stool, constipation and diarrhea.  Endocrine: Negative for increased urination.  Genitourinary:  Negative for involuntary urination.  Musculoskeletal:  Positive for joint pain, gait problem, joint pain, myalgias, morning stiffness, muscle tenderness and myalgias. Negative for joint swelling and muscle weakness.  Skin:  Negative for color change, rash, hair loss and sensitivity to sunlight.  Allergic/Immunologic: Negative for susceptible to infections.  Neurological:  Negative for dizziness and headaches.  Hematological:  Negative for swollen glands.  Psychiatric/Behavioral:  Positive for depressed mood and sleep disturbance. The patient is nervous/anxious.     PMFS History:  Patient Active Problem List   Diagnosis Date Noted   Spondylolisthesis at L4-L5 level 10/22/2022   OSA on CPAP 01/27/2021   Diabetes mellitus without complication (HCC) 06/13/2018   Primary osteoarthritis of both hands 08/30/2017   Primary osteoarthritis of both knees 08/30/2017   DDD (degenerative disc disease), cervical 08/30/2017   DDD (degenerative disc disease), lumbar 08/30/2017   History of insomnia 08/30/2017   Fatty liver 08/30/2017   History of TIA (transient ischemic attack) 08/30/2017   S/p bilateral carpal tunnel release 08/30/2017   History of gastroesophageal reflux (GERD) 08/30/2017   History of vitamin D deficiency 08/30/2017  Spondylolisthesis of lumbar region 01/12/2017   Benign essential hypertension 05/06/2016   Oral herpes 05/06/2016   Carpal tunnel syndrome of left wrist 11/08/2015   Hyperlipidemia, unspecified 05/10/2015   Osteopenia 11/05/2014   Closed fracture of fibula 01/09/2014   Sleep apnea 01/06/2011   Restless leg syndrome 01/06/2011   Insomnia 01/06/2011   Fibromyalgia  01/06/2011   GERD (gastroesophageal reflux disease) 12/15/2007    Past Medical History:  Diagnosis Date   Allergy    Anemia    Arthritis    Broken ankle 11/2013   right ankle   Cataract    Bilateral - surgery to remove   Diabetes mellitus without complication (HCC)    type 2   Fatty liver    Fever blister    Fibromyalgia 2005   GERD (gastroesophageal reflux disease)    uses prilosec   Headache(784.0)    not as often as before   Hypercholesteremia    Hypertension    Hyperthyroidism    in remission per patient, no meds   OSA (obstructive sleep apnea)    uses CPAP nightly   Plantar fasciitis    no current problems per patient as of 10/08/22   Post-operative nausea and vomiting    Restless leg syndrome    Schatzki's ring    Spondylolisthesis of lumbar region    Stress incontinence    Stroke (HCC) 2015   TIA.  unable to determine if this is true.   Thyroiditis    Vertigo     Family History  Problem Relation Age of Onset   Colon cancer Mother 36   Lung cancer Father 32   Colon polyps Maternal Aunt    Colon cancer Maternal Grandfather    Breast cancer Neg Hx    Esophageal cancer Neg Hx    Stomach cancer Neg Hx    Rectal cancer Neg Hx    Past Surgical History:  Procedure Laterality Date   CARPAL TUNNEL RELEASE Left 12/18/2015   Procedure: CARPAL TUNNEL RELEASE;  Surgeon: Josephus Nida, MD;  Location: [facility] ORS;  Service: Orthopedics;  Laterality: Left;   CARPAL TUNNEL RELEASE Right 2018   CATARACT EXTRACTION W/PHACO Right 11/13/2015   Procedure: CATARACT EXTRACTION PHACO AND INTRAOCULAR LENS PLACEMENT (IOC);  Surgeon: Steven Dingeldein, MD;  Location: [facility] OR[facility];  Service: Ophthalmology;  Laterality: Right;  US  01:08AP% 21.6CDE 26.99Fluid pack lot # [id number]   CATARACT EXTRACTION W/PHACO Left 12/04/2015   Procedure: CATARACT EXTRACTION PHACO AND INTRAOCULAR LENS PLACEMENT (IOC);  Surgeon: Steven Dingeldein, MD;  Location: [facility] OR[facility];  Service: Ophthalmology;  Laterality:  Left;  US  59.2 AP% 24.4 CDE 29.2 Fluid Pack lot # [id number]   CERVICAL DISC SURGERY     cervical decompression c4-5 and c5-6   COLONOSCOPY W/ BIOPSIES AND POLYPECTOMY     CTR     right hand   EYE SURGERY Bilateral    cataract    KYPHOPLASTY  02/12/2023   LUMBAR FUSION  10/22/2022   L4-L5   LUMBAR SPINE SURGERY  2018   SHOULDER SURGERY     right shoulder   TRIGGER FINGER RELEASE     right finger   VAGINA SURGERY  04/2022   vaginal tear repair   WISDOM TOOTH EXTRACTION     Social History   Social History Narrative   Married to [place], no natural children   Right handed   Bachelor's degree   2-3 cups daily         Immunization History  Administered Date(s)  Administered   Influenza Inj Mdck Quad Pf 12/11/2016   Influenza Inj Mdck Quad With Preservative 12/13/2017   Influenza,inj,Quad PF,6+ Mos 10/29/2018   Influenza-Unspecified 02/15/2015, 01/01/2016, 12/02/2016, 12/22/2021   Moderna Covid-19 Vaccine Bivalent Booster 66yrs & up 12/24/2021   PFIZER(Purple Top)SARS-COV-2 Vaccination 05/11/2019, 06/06/2019, 12/11/2019   Tdap 11/14/2007, 04/14/2017     Objective: Vital Signs: BP 133/87 (BP Location: Left Arm, Patient Position: Sitting, Cuff Size: Normal)   Pulse 78   Resp 16   Ht 5' 0.25" (1.53 m)   Wt 219 lb 6.4 oz (99.5 kg)   BMI 42.49 kg/m    Physical Exam Vitals and nursing note reviewed.  Constitutional:      Appearance: She is well-developed.  HENT:     Head: Normocephalic and atraumatic.  Eyes:     Conjunctiva/sclera: Conjunctivae normal.  Cardiovascular:     Rate and Rhythm: Normal rate and regular rhythm.     Heart sounds: Normal heart sounds.  Pulmonary:     Effort: Pulmonary effort is normal.     Breath sounds: Normal breath sounds.  Abdominal:     General: Bowel sounds are normal.     Palpations: Abdomen is soft.  Musculoskeletal:     Cervical back: Normal range of motion.     Right lower leg: Edema present.     Left lower leg: Edema  present.  Lymphadenopathy:     Cervical: No cervical adenopathy.  Skin:    General: Skin is warm and dry.     Capillary Refill: Capillary refill takes less than 2 seconds.  Neurological:     Mental Status: She is alert and oriented to person, place, and time.  Psychiatric:        Behavior: Behavior normal.      Musculoskeletal Exam: She had good range of motion of the cervical spine.  She had painful limited range of motion of the lumbar spine.  Shoulders, elbows, wrist joints, MCPs and PIPs were in good range of motion.  She had bilateral DIP and PIP thickening with no synovitis.  Hip joints and knee joints in good range of motion.  She discomfort range of motion of her knee joints without any warmth swelling or effusion.  There was no tenderness over ankles or MTPs.  CDAI Exam: CDAI Score: -- Patient Global: --; Provider Global: -- Swollen: --; Tender: -- Joint Exam 07/15/2023   No joint exam has been documented for this visit   There is currently no information documented on the homunculus. Go to the Rheumatology activity and complete the homunculus joint exam.  Investigation: No additional findings.  Imaging: MM 3D SCREENING MAMMOGRAM BILATERAL BREAST Result Date: 07/14/2023 CLINICAL DATA:  Screening. EXAM: DIGITAL SCREENING BILATERAL MAMMOGRAM WITH TOMOSYNTHESIS AND CAD TECHNIQUE: Bilateral screening digital craniocaudal and mediolateral oblique mammograms were obtained. Bilateral screening digital breast tomosynthesis was performed. The images were evaluated with computer-aided detection. COMPARISON:  Previous exam(s). ACR Breast Density Category b: There are scattered areas of fibroglandular density. FINDINGS: There are no findings suspicious for malignancy. IMPRESSION: No mammographic evidence of malignancy. A result letter of this screening mammogram will be mailed directly to the patient. RECOMMENDATION: Screening mammogram in one year. (Code:SM-B-01Y) BI-RADS CATEGORY  1:  Negative. Electronically Signed   By: Sundra Engel M.D.   On: 07/14/2023 16:53    Recent Labs: Lab Results  Component Value Date   WBC 12.6 (H) 10/23/2022   HGB 8.6 (L) 10/23/2022   PLT 173 10/23/2022   NA 135 10/23/2022  K 3.8 10/23/2022   CL 103 10/23/2022   CO2 21 (L) 10/23/2022   GLUCOSE 122 (H) 10/23/2022   BUN 14 10/23/2022   CREATININE 0.92 10/23/2022   BILITOT 0.2 (L) 10/08/2022   ALKPHOS 67 10/08/2022   AST 51 (H) 10/08/2022   ALT 53 (H) 10/08/2022   PROT 7.7 10/08/2022   ALBUMIN  3.7 10/08/2022   CALCIUM  8.6 (L) 10/23/2022   GFRAA 89 08/04/2017     Speciality Comments: No specialty comments available.  Procedures:  No procedures performed Allergies: Codeine and Darvocet [propoxyphene n-acetaminophen ]   Assessment / Plan:     Visit Diagnoses: Primary osteoarthritis of both hands-she continues to have some stiffness in her hands.  Bilateral PIP and DIP thickening was noted.  Joint protection muscle strength was discussed.  Primary osteoarthritis of both knees -she continues to have pain and discomfort in her bilateral knee joints.  Last cortisone injection in November 2024 was helpful.  She previously underwent viscosupplementation for both knees in 2020.  Patient states she is scheduled to see an orthopedic surgeon tomorrow for left total knee replacement.  Chronic pain of left knee-she continues to have left knee joint pain.  She is scheduled to have an appointment with an orthopedic surgeon at [facility] tomorrow for  total knee replacement.  DDD (degenerative disc disease), cervical-she continues to have some stiffness.  She good range of motion.  Degeneration of intervertebral disc of lumbar region without discogenic back pain or lower extremity pain -she continues to have some lower back discomfort.  Underwent posterior lumbar fusion L4-L5 on 10/22/2022 performed by Dr. Ellery Guthrie.  Fibromyalgia -she continues to have generalized pain and discomfort from  fibromyalgia.  Positive tender points noted.  Need for regular exercise and stretching was emphasized.  She is on Cymbalta  30 mg 1 capsule daily and takes tramadol  50 mg 1 tablet at bedtime as needed for pain relief.  Patient states she is unable to sleep without tramadol .  Medication monitoring encounter -she is on Ro well.  UDS and narcotic agreement: 01/14/2023.June 10, 2023 CBC WBC 7.7, hemoglobin 14.6, platelets 150, CMP normal except AST 75 and ALT 82, lipid panel within normal limits.,  Hemoglobin A1c 7.1  Osteopenia of multiple sites - DEXA updated on 08/18/21.  Patient states she discontinued Fosamax in November 2024.  She has an appointment coming up with an endocrinologist to discuss treatment options.  History of vitamin D deficiency-he takes vitamin D 2000 units daily.  History of recent fall-patient states she fell in November 2024 and developed T12 compression fracture.  She had kyphoplasty in December 2024.  She had good response to kyphoplasty.  Other medical problems are listed as follows:  History of diabetes mellitus, type II-hemoglobin A1c was elevated at 7.1 on June 10, 2023.  History of TIA (transient ischemic attack)  History of gastroesophageal reflux (GERD)  Fatty liver-her LFTs remain elevated.  RLS (restless legs syndrome)  History of migraine  History of hypertension-blood pressure was normal at 133/87.  History of sleep apnea  Orders: Orders Placed This Encounter  Procedures   DRUG MONITOR, TRAMADOL ,QN, URINE   DRUG MONITOR, PANEL 5, W/CONF, URINE   No orders of the defined types were placed in this encounter.    Follow-Up Instructions: Return in about 6 months (around 01/15/2024) for Osteoarthritis.   Nicholas Bari, MD  Note - This record has been created using Animal nutritionist.  Chart creation errors have been sought, but may not always  have been located. Such  creation errors do not reflect on  the standard of medical care.

## 2023-07-02 ENCOUNTER — Other Ambulatory Visit: Payer: Self-pay | Admitting: *Deleted

## 2023-07-02 ENCOUNTER — Encounter (HOSPITAL_COMMUNITY): Payer: Self-pay

## 2023-07-02 MED ORDER — ZOLPIDEM TARTRATE 5 MG PO TABS: ORAL_TABLET | ORAL | 0 refills | Status: DC

## 2023-07-02 MED ORDER — TRAMADOL HCL 50 MG PO TABS: ORAL_TABLET | ORAL | 0 refills | Status: DC

## 2023-07-02 NOTE — Telephone Encounter (Signed)
 Patient contacted the office requesting a refill on Ambien  and Tramadol  to be sent to [facility].    Last Fill: 06/03/2023   UDS:01/14/2023 UDS is consistent with treatment    Narc Agreement: 01/14/2023    Next Visit: 07/15/2023   Last Visit: 01/14/2023   Dx: Fibromyalgia    Current Dose per office note on 01/14/2023:tramadol  50 mg 1 tablet at bedtime as needed for pain relief.  Ambien  dose not discussed.      Okay to refill tramadol  and ambien ?

## 2023-07-09 ENCOUNTER — Ambulatory Visit
Admission: RE | Admit: 2023-07-09 | Discharge: 2023-07-09 | Disposition: A | Discharge status: Home / Self Care | Source: Ambulatory Visit | Attending: Family Medicine | Admitting: Family Medicine

## 2023-07-09 DIAGNOSIS — Z1231 Encounter for screening mammogram for malignant neoplasm of breast: Secondary | ICD-10-CM | POA: Diagnosis present

## 2023-07-15 ENCOUNTER — Encounter: Payer: Self-pay | Admitting: Rheumatology

## 2023-07-15 ENCOUNTER — Ambulatory Visit: Payer: BLUE CROSS/BLUE SHIELD | Attending: Rheumatology | Admitting: Rheumatology

## 2023-07-15 VITALS — BP 133/87 | HR 78 | Resp 16 | Ht 60.25 in | Wt 219.4 lb

## 2023-07-15 DIAGNOSIS — Z9181 History of falling: Secondary | ICD-10-CM | POA: Diagnosis present

## 2023-07-15 DIAGNOSIS — M170 Bilateral primary osteoarthritis of knee: Secondary | ICD-10-CM

## 2023-07-15 DIAGNOSIS — Z8719 Personal history of other diseases of the digestive system: Secondary | ICD-10-CM

## 2023-07-15 DIAGNOSIS — K760 Fatty (change of) liver, not elsewhere classified: Secondary | ICD-10-CM

## 2023-07-15 DIAGNOSIS — G2581 Restless legs syndrome: Secondary | ICD-10-CM

## 2023-07-15 DIAGNOSIS — Z8673 Personal history of transient ischemic attack (TIA), and cerebral infarction without residual deficits: Secondary | ICD-10-CM

## 2023-07-15 DIAGNOSIS — M5030 Other cervical disc degeneration, unspecified cervical region: Secondary | ICD-10-CM | POA: Diagnosis present

## 2023-07-15 DIAGNOSIS — Z8639 Personal history of other endocrine, nutritional and metabolic disease: Secondary | ICD-10-CM

## 2023-07-15 DIAGNOSIS — Z8679 Personal history of other diseases of the circulatory system: Secondary | ICD-10-CM | POA: Diagnosis present

## 2023-07-15 DIAGNOSIS — Z8669 Personal history of other diseases of the nervous system and sense organs: Secondary | ICD-10-CM

## 2023-07-15 DIAGNOSIS — M8589 Other specified disorders of bone density and structure, multiple sites: Secondary | ICD-10-CM

## 2023-07-15 DIAGNOSIS — G8929 Other chronic pain: Secondary | ICD-10-CM

## 2023-07-15 DIAGNOSIS — M19041 Primary osteoarthritis, right hand: Secondary | ICD-10-CM | POA: Diagnosis present

## 2023-07-15 DIAGNOSIS — M19042 Primary osteoarthritis, left hand: Secondary | ICD-10-CM | POA: Insufficient documentation

## 2023-07-15 DIAGNOSIS — Z5181 Encounter for therapeutic drug level monitoring: Secondary | ICD-10-CM | POA: Diagnosis present

## 2023-07-15 DIAGNOSIS — M25562 Pain in left knee: Secondary | ICD-10-CM | POA: Insufficient documentation

## 2023-07-15 DIAGNOSIS — M797 Fibromyalgia: Secondary | ICD-10-CM

## 2023-07-15 DIAGNOSIS — M51369 Other intervertebral disc degeneration, lumbar region without mention of lumbar back pain or lower extremity pain: Secondary | ICD-10-CM | POA: Diagnosis present

## 2023-07-16 LAB — DM TEMPLATE

## 2023-07-18 LAB — DRUG MONITOR, PANEL 5, W/CONF, URINE
Amphetamines: NEGATIVE ng/mL (ref <500)
Barbiturates: NEGATIVE ng/mL (ref <300)
Benzodiazepines: NEGATIVE ng/mL (ref <100)
Cocaine Metabolite: NEGATIVE ng/mL (ref <150)
Creatinine: 92 mg/dL (ref >=20.0)
Marijuana Metabolite: NEGATIVE ng/mL (ref <20)
Methadone Metabolite: NEGATIVE ng/mL (ref <100)
Opiates: NEGATIVE ng/mL (ref <100)
Oxidant: NEGATIVE ug/mL (ref <200)
Oxycodone: NEGATIVE ng/mL (ref <100)
pH: 6 (ref 4.5–9.0)

## 2023-07-18 LAB — DM TEMPLATE

## 2023-07-18 LAB — DRUG MONITOR, TRAMADOL,QN, URINE
Desmethyltramadol: NEGATIVE ng/mL (ref <100)
Tramadol: NEGATIVE ng/mL (ref <100)

## 2023-07-20 ENCOUNTER — Ambulatory Visit: Payer: Self-pay | Admitting: Rheumatology

## 2023-07-20 ENCOUNTER — Other Ambulatory Visit: Payer: Self-pay | Admitting: *Deleted

## 2023-07-20 MED ORDER — TRAMADOL HCL 50 MG PO TABS: ORAL_TABLET | ORAL | Status: DC

## 2023-07-20 NOTE — Progress Notes (Signed)
 UDS negative for tramadol  use.  Did patient ran out of tramadol ?  We should not be filling tramadol  if she is not taking it.

## 2023-07-20 NOTE — Progress Notes (Signed)
 Please review the refill history of tramadol .  We may not need to fill it very often if patient is taking it only 3-4 times a week.

## 2023-07-21 ENCOUNTER — Other Ambulatory Visit: Payer: Self-pay | Admitting: Orthopedic Surgery

## 2023-07-29 ENCOUNTER — Other Ambulatory Visit: Payer: Self-pay

## 2023-07-29 ENCOUNTER — Encounter
Admission: RE | Admit: 2023-07-29 | Discharge: 2023-07-29 | Disposition: A | Discharge status: Home / Self Care | Source: Ambulatory Visit | Attending: Orthopedic Surgery | Admitting: Orthopedic Surgery

## 2023-07-29 VITALS — BP 122/80 | HR 75 | Ht 60.25 in | Wt 220.5 lb

## 2023-07-29 DIAGNOSIS — Z79899 Other long term (current) drug therapy: Secondary | ICD-10-CM | POA: Diagnosis not present

## 2023-07-29 DIAGNOSIS — M179 Osteoarthritis of knee, unspecified: Secondary | ICD-10-CM

## 2023-07-29 DIAGNOSIS — E119 Type 2 diabetes mellitus without complications: Secondary | ICD-10-CM | POA: Diagnosis not present

## 2023-07-29 DIAGNOSIS — R8290 Unspecified abnormal findings in urine: Secondary | ICD-10-CM

## 2023-07-29 DIAGNOSIS — Z01810 Encounter for preprocedural cardiovascular examination: Secondary | ICD-10-CM | POA: Diagnosis not present

## 2023-07-29 DIAGNOSIS — Z01812 Encounter for preprocedural laboratory examination: Secondary | ICD-10-CM

## 2023-07-29 DIAGNOSIS — Z01818 Encounter for other preprocedural examination: Secondary | ICD-10-CM | POA: Insufficient documentation

## 2023-07-29 DIAGNOSIS — R8271 Bacteriuria: Secondary | ICD-10-CM

## 2023-07-29 HISTORY — DX: Unilateral primary osteoarthritis, left knee: M17.12

## 2023-07-29 HISTORY — DX: Pneumonia, unspecified organism: J18.9

## 2023-07-29 HISTORY — DX: Depression, unspecified: F32.A

## 2023-07-29 LAB — SURGICAL PCR SCREEN
MRSA, PCR: NEGATIVE
Staphylococcus aureus: NEGATIVE

## 2023-07-29 LAB — URINALYSIS, ROUTINE W REFLEX MICROSCOPIC
Bilirubin Urine: NEGATIVE
Glucose, UA: NEGATIVE mg/dL
Hgb urine dipstick: NEGATIVE
Ketones, ur: NEGATIVE mg/dL
Nitrite: NEGATIVE
Protein, ur: NEGATIVE mg/dL
Specific Gravity, Urine: 1.016 (ref 1.005–1.030)
pH: 5 (ref 5.0–8.0)

## 2023-07-29 NOTE — Patient Instructions (Addendum)
 Your procedure is scheduled on: 08/05/23 - Thursday Report to the Registration Desk on the 1st floor of the [facility]. To find out your arrival time, please call [phone] between 1PM - 3PM on: 08/04/23 - Wednesday If your arrival time is 6:00 am, do not arrive before that time as the [facility] entrance doors do not open until 6:00 am.  REMEMBER: Instructions that are not followed completely may result in serious medical risk, up to and including death; or upon the discretion of your surgeon and anesthesiologist your surgery may need to be rescheduled.  Do not eat food after midnight the night before surgery.  No gum chewing or hard candies.  You may however, drink CLEAR liquids up to 2 hours before you are scheduled to arrive for your surgery. Do not drink anything within 2 hours of your scheduled arrival time.  Clear liquids include: - water   In addition, your doctor has ordered for you to drink the provided:  Gatorade G2 Drinking this carbohydrate drink up to two hours before surgery helps to reduce insulin resistance and improve patient outcomes. Please complete drinking 2 hours before scheduled arrival time.  One week prior to surgery: Stop Anti-inflammatories (NSAIDS) such as VOLTAREN  ,Advil, Aleve , Ibuprofen, Motrin, Naproxen , Naprosyn  and Aspirin based products such as Excedrin, Goody's Powder, BC Powder.  Stop ANY OVER THE COUNTER supplements until after surgery : APPLE CIDER VINEGAR ,b complex ,CALCIUM  ,VITAMIN D ,Potassium Gluconate .  Dulaglutide (TRULICITY) - hold 7 days prior to your surgery  Metformin   (GLUCOPHAGE -XR) hold beginning 08/03/23, may resume after surgery.  Continue taking all of your other prescription medications up until the day before surgery.  ON THE DAY OF SURGERY ONLY TAKE THESE MEDICATIONS WITH SIPS OF WATER:  celecoxib  (CELEBREX )  omeprazole (PRILOSEC)  rosuvastatin  (CRESTOR )  traMADol  (ULTRAM ) if needed   No Alcohol for 24 hours  before or after surgery.  No Smoking including e-cigarettes for 24 hours before surgery.  No chewable tobacco products for at least 6 hours before surgery.  No nicotine patches on the day of surgery.  Do not use any "recreational" drugs for at least a week (preferably 2 weeks) before your surgery.  Please be advised that the combination of cocaine and anesthesia may have negative outcomes, up to and including death. If you test positive for cocaine, your surgery will be cancelled.  On the morning of surgery brush your teeth with toothpaste and water, you may rinse your mouth with mouthwash if you wish. Do not swallow any toothpaste or mouthwash.  Use CHG Soap or wipes as directed on instruction sheet.  Do not wear jewelry, make-up, hairpins, clips or nail polish.  For welded (permanent) jewelry: bracelets, anklets, waist bands, etc.  Please have this removed prior to surgery.  If it is not removed, there is a chance that hospital personnel will need to cut it off on the day of surgery.  Do not wear lotions, powders, or perfumes.   Do not shave body hair from the neck down 48 hours before surgery.  Contact lenses, hearing aids and dentures may not be worn into surgery.  Do not bring valuables to the hospital. [facility] is not responsible for any missing/lost belongings or valuables.   Bring your C-PAP to the hospital in case you may have to spend the night.   Notify your doctor if there is any change in your medical condition (cold, fever, infection).  Wear comfortable clothing (specific to your surgery type)  to the hospital.  After surgery, you can help prevent lung complications by doing breathing exercises.  Take deep breaths and cough every 1-2 hours. Your doctor may order a device called an Incentive Spirometer to help you take deep breaths.  When coughing or sneezing, hold a pillow firmly against your incision with both hands. This is called "splinting." Doing this helps  protect your incision. It also decreases belly discomfort.  If you are being admitted to the hospital overnight, leave your suitcase in the car. After surgery it may be brought to your room.  In case of increased patient census, it may be necessary for you, the patient, to continue your postoperative care in the Same Day Surgery department.  If you are being discharged the day of surgery, you will not be allowed to drive home. You will need a responsible individual to drive you home and stay with you for 24 hours after surgery.   If you are taking public transportation, you will need to have a responsible individual with you.  Please call the Pre-admissions Testing Dept. at [phone] if you have any questions about these instructions.  Surgery Visitation Policy:  Patients having surgery or a procedure may have two visitors.  Children under the age of 51 must have an adult with them who is not the patient.  Inpatient Visitation:    Visiting hours are 7 a.m. to 8 p.m. Up to four visitors are allowed at one time in a patient room. The visitors may rotate out with other people during the day.  One visitor age 52 or older may stay with the patient overnight and must be in the room by 8 p.m.    Pre-operative 5 CHG Bath Instructions   You can play a key role in reducing the risk of infection after surgery. Your skin needs to be as free of germs as possible. You can reduce the number of germs on your skin by washing with CHG (chlorhexidine  gluconate) soap before surgery. CHG is an antiseptic soap that kills germs and continues to kill germs even after washing.   DO NOT use if you have an allergy to chlorhexidine /CHG or antibacterial soaps. If your skin becomes reddened or irritated, stop using the CHG and notify one of our RNs at [phone].   Please shower with the CHG soap starting 4 days before surgery using the following schedule: 06/08 - 06/12.               Please keep in mind  the following:  DO NOT shave, including legs and underarms, starting the day of your first shower.   You may shave your face at any point before/day of surgery.  Place clean sheets on your bed the day you start using CHG soap. Use a clean washcloth (not used since being washed) for each shower. DO NOT sleep with pets once you start using the CHG.   CHG Shower Instructions:  If you choose to wash your hair and private area, wash first with your normal shampoo/soap.  After you use shampoo/soap, rinse your hair and body thoroughly to remove shampoo/soap residue.  Turn the water OFF and apply about 3 tablespoons (45 ml) of CHG soap to a CLEAN washcloth.  Apply CHG soap ONLY FROM YOUR NECK DOWN TO YOUR TOES (washing for 3-5 minutes)  DO NOT use CHG soap on face, private areas, open wounds, or sores.  Pay special attention to the area where your surgery is being performed.  If  you are having back surgery, having someone wash your back for you may be helpful. Wait 2 minutes after CHG soap is applied, then you may rinse off the CHG soap.  Pat dry with a clean towel  Put on clean clothes/pajamas   If you choose to wear lotion, please use ONLY the CHG-compatible lotions on the back of this paper.     Additional instructions for the day of surgery: DO NOT APPLY any lotions, deodorants, cologne, or perfumes.   Put on clean/comfortable clothes.  Brush your teeth.  Ask your nurse before applying any prescription medications to the skin.      CHG Compatible Lotions   Aveeno Moisturizing lotion  Cetaphil Moisturizing Cream  Cetaphil Moisturizing Lotion  Clairol Herbal Essence Moisturizing Lotion, Dry Skin  Clairol Herbal Essence Moisturizing Lotion, Extra Dry Skin  Clairol Herbal Essence Moisturizing Lotion, Normal Skin  Curel Age Defying Therapeutic Moisturizing Lotion with Alpha Hydroxy  Curel Extreme Care Body Lotion  Curel Soothing Hands Moisturizing Hand Lotion  Curel Therapeutic  Moisturizing Cream, Fragrance-Free  Curel Therapeutic Moisturizing Lotion, Fragrance-Free  Curel Therapeutic Moisturizing Lotion, Original Formula  Eucerin Daily Replenishing Lotion  Eucerin Dry Skin Therapy Plus Alpha Hydroxy Crme  Eucerin Dry Skin Therapy Plus Alpha Hydroxy Lotion  Eucerin Original Crme  Eucerin Original Lotion  Eucerin Plus Crme Eucerin Plus Lotion  Eucerin TriLipid Replenishing Lotion  Keri Anti-Bacterial Hand Lotion  Keri Deep Conditioning Original Lotion Dry Skin Formula Softly Scented  Keri Deep Conditioning Original Lotion, Fragrance Free Sensitive Skin Formula  Keri Lotion Fast Absorbing Fragrance Free Sensitive Skin Formula  Keri Lotion Fast Absorbing Softly Scented Dry Skin Formula  Keri Original Lotion  Keri Skin Renewal Lotion Keri Silky Smooth Lotion  Keri Silky Smooth Sensitive Skin Lotion  Nivea Body Creamy Conditioning Oil  Nivea Body Extra Enriched Lotion  Nivea Body Original Lotion  Nivea Body Sheer Moisturizing Lotion Nivea Crme  Nivea Skin Firming Lotion  NutraDerm 30 Skin Lotion  NutraDerm Skin Lotion  NutraDerm Therapeutic Skin Cream  NutraDerm Therapeutic Skin Lotion  ProShield Protective Hand Cream  Provon moisturizing lotion  How to Use an Incentive Spirometer  An incentive spirometer is a tool that measures how well you are filling your lungs with each breath. Learning to take long, deep breaths using this tool can help you keep your lungs clear and active. This may help to reverse or lessen your chance of developing breathing (pulmonary) problems, especially infection. You may be asked to use a spirometer: After a surgery. If you have a lung problem or a history of smoking. After a long period of time when you have been unable to move or be active. If the spirometer includes an indicator to show the highest number that you have reached, your health care provider or respiratory therapist will help you set a goal. Keep a log of your  progress as told by your health care provider. What are the risks? Breathing too quickly may cause dizziness or cause you to pass out. Take your time so you do not get dizzy or light-headed. If you are in pain, you may need to take pain medicine before doing incentive spirometry. It is harder to take a deep breath if you are having pain. How to use your incentive spirometer  Sit up on the edge of your bed or on a chair. Hold the incentive spirometer so that it is in an upright position. Before you use the spirometer, breathe out normally. Place the mouthpiece  in your mouth. Make sure your lips are closed tightly around it. Breathe in slowly and as deeply as you can through your mouth, causing the piston or the ball to rise toward the top of the chamber. Hold your breath for 3-5 seconds, or for as long as possible. If the spirometer includes a coach indicator, use this to guide you in breathing. Slow down your breathing if the indicator goes above the marked areas. Remove the mouthpiece from your mouth and breathe out normally. The piston or ball will return to the bottom of the chamber. Rest for a few seconds, then repeat the steps 10 or more times. Take your time and take a few normal breaths between deep breaths so that you do not get dizzy or light-headed. Do this every 1-2 hours when you are awake. If the spirometer includes a goal marker to show the highest number you have reached (best effort), use this as a goal to work toward during each repetition. After each set of 10 deep breaths, cough a few times. This will help to make sure that your lungs are clear. If you have an incision on your chest or abdomen from surgery, place a pillow or a rolled-up towel firmly against the incision when you cough. This can help to reduce pain while taking deep breaths and coughing. General tips When you are able to get out of bed: Walk around often. Continue to take deep breaths and cough in order to  clear your lungs. Keep using the incentive spirometer until your health care provider says it is okay to stop using it. If you have been in the hospital, you may be told to keep using the spirometer at home. Contact a health care provider if: You are having difficulty using the spirometer. You have trouble using the spirometer as often as instructed. Your pain medicine is not giving enough relief for you to use the spirometer as told. You have a fever. Get help right away if: You develop shortness of breath. You develop a cough with bloody mucus from the lungs. You have fluid or blood coming from an incision site after you cough. Summary An incentive spirometer is a tool that can help you learn to take long, deep breaths to keep your lungs clear and active. You may be asked to use a spirometer after a surgery, if you have a lung problem or a history of smoking, or if you have been inactive for a long period of time. Use your incentive spirometer as instructed every 1-2 hours while you are awake. If you have an incision on your chest or abdomen, place a pillow or a rolled-up towel firmly against your incision when you cough. This will help to reduce pain. Get help right away if you have shortness of breath, you cough up bloody mucus, or blood comes from your incision when you cough. This information is not intended to replace advice given to you by your health care provider. Make sure you discuss any questions you have with your health care provider. Document Revised: 05/01/2019 Document Reviewed: 05/01/2019 Elsevier Patient Education  2023 Elsevier Inc.   POLAR CARE INFORMATION  MassAdvertisement.it  How to use [facility] Therapy System?  YouTube   ShippingScam.co.uk  OPERATING INSTRUCTIONS  Start the product With dry hands, connect the transformer to the electrical connection located on the top of the cooler. Next, plug the transformer into an  appropriate electrical outlet. The unit will automatically start running at  this point.  To stop the pump, disconnect electrical power.  Unplug to stop the product when not in use. Unplugging the Polar Care unit turns it off. Always unplug immediately after use. Never leave it plugged in while unattended. Remove pad.    FIRST ADD WATER TO FILL LINE, THEN ICE---Replace ice when existing ice is almost melted  1 Discuss Treatment with your Licensed Health Care Practitioner and Use Only as Prescribed 2 Apply Insulation Barrier & Cold Therapy Pad 3 Check for Moisture 4 Inspect Skin Regularly  Tips and Trouble Shooting Usage Tips 1. Use cubed or chunked ice for optimal performance. 2. It is recommended to drain the Pad between uses. To drain the pad, hold the Pad upright with the hose pointed toward the ground. Depress the black plunger and allow water to drain out. 3. You may disconnect the Pad from the unit without removing the pad from the affected area by depressing the silver tabs on the hose coupling and gently pulling the hoses apart. The Pad and unit will seal itself and will not leak. Note: Some dripping during release is normal. 4. DO NOT RUN PUMP WITHOUT WATER! The pump in this unit is designed to run with water. Running the unit without water will cause permanent damage to the pump. 5. Unplug unit before removing lid.  TROUBLESHOOTING GUIDE Pump not running, Water not flowing to the pad, Pad is not getting cold 1. Make sure the transformer is plugged into the wall outlet. 2. Confirm that the ice and water are filled to the indicated levels. 3. Make sure there are no kinks in the pad. 4. Gently pull on the blue tube to make sure the tube/pad junction is straight. 5. Remove the pad from the treatment site and ll it while the pad is lying at; then reapply. 6. Confirm that the pad couplings are securely attached to the unit. Listen for the double clicks (Figure 1) to confirm the pad  couplings are securely attached.  Leaks    Note: Some condensation on the lines, controller, and pads is unavoidable, especially in warmer climates. 1. If using a [employer] Polar Care Cold Therapy unit with a detachable Cold Therapy Pad, and a leak exists (other than condensation on the lines) disconnect the pad couplings. Make sure the silver tabs on the couplings are depressed before reconnecting the pad to the pump hose; then confirm both sides of the coupling are properly clicked in. 2. If the coupling continues to leak or a leak is detected in the pad itself, stop using it and call [employer] Customer Care at [phone].  Cleaning After use, empty and dry the unit with a soft cloth. Warm water and mild detergent may be used occasionally to clean the pump and tubes.  WARNING: The Polar Care Cube can be cold enough to cause serious injury, including full skin necrosis. Follow these Operating Instructions, and carefully read the Product Insert (see pouch on side of unit) and the Cold Therapy Pad Fitting Instructions (provided with each Cold Therapy Pad) prior to use.

## 2023-07-31 ENCOUNTER — Ambulatory Visit: Payer: Self-pay | Admitting: Urgent Care

## 2023-07-31 LAB — URINE CULTURE: Culture: <10000 — AB

## 2023-08-03 ENCOUNTER — Other Ambulatory Visit: Payer: Self-pay | Admitting: Rheumatology

## 2023-08-03 ENCOUNTER — Other Ambulatory Visit: Payer: Self-pay | Admitting: *Deleted

## 2023-08-03 ENCOUNTER — Inpatient Hospital Stay: Admission: RE | Admit: 2023-08-03 | Source: Ambulatory Visit

## 2023-08-03 MED ORDER — ZOLPIDEM TARTRATE 5 MG PO TABS: ORAL_TABLET | ORAL | 0 refills | Status: DC

## 2023-08-03 NOTE — Telephone Encounter (Signed)
 Patient requested refill on Ambien .  Last Fill: 07/02/2023  Next Visit: 12/30/2023  Last Visit: 07/15/2023  Dx: not discussed  Current Dose per office note on 07/15/2023: not discussed  Okay to refill Ambien ?

## 2023-08-04 ENCOUNTER — Other Ambulatory Visit: Payer: Self-pay | Admitting: Rheumatology

## 2023-08-04 ENCOUNTER — Encounter: Payer: Self-pay | Admitting: Orthopedic Surgery

## 2023-08-04 MED ORDER — ZOLPIDEM TARTRATE 5 MG PO TABS: ORAL_TABLET | ORAL | 0 refills | Status: DC

## 2023-08-04 NOTE — Telephone Encounter (Signed)
 Pt called stating her prescription (ambien ) was sent to [place]  and not [facility] in [place] where she needs to pick it up. Pt was upset about this and wanted someone to call her to confirm they would be canceling the prescription in [place] and sending it here in [place]. Pt was upset due to her having surgery tomorrow and she would not be able to return to the drug store.

## 2023-08-05 ENCOUNTER — Ambulatory Visit

## 2023-08-05 ENCOUNTER — Other Ambulatory Visit: Payer: Self-pay

## 2023-08-05 ENCOUNTER — Encounter: Payer: Self-pay | Admitting: Orthopedic Surgery

## 2023-08-05 ENCOUNTER — Ambulatory Visit: Payer: Self-pay | Admitting: Urgent Care

## 2023-08-05 ENCOUNTER — Encounter
Admission: RE | Disposition: A | Discharge status: Home / Self Care | Payer: Self-pay | Source: Home / Self Care | Attending: Orthopedic Surgery

## 2023-08-05 ENCOUNTER — Ambulatory Visit
Admission: RE | Admit: 2023-08-05 | Discharge: 2023-08-06 | Disposition: A | Discharge status: Home / Self Care | Attending: Orthopedic Surgery | Admitting: Orthopedic Surgery

## 2023-08-05 DIAGNOSIS — Z7984 Long term (current) use of oral hypoglycemic drugs: Secondary | ICD-10-CM | POA: Diagnosis not present

## 2023-08-05 DIAGNOSIS — K760 Fatty (change of) liver, not elsewhere classified: Secondary | ICD-10-CM | POA: Diagnosis not present

## 2023-08-05 DIAGNOSIS — R8290 Unspecified abnormal findings in urine: Secondary | ICD-10-CM

## 2023-08-05 DIAGNOSIS — R42 Dizziness and giddiness: Secondary | ICD-10-CM | POA: Insufficient documentation

## 2023-08-05 DIAGNOSIS — G4733 Obstructive sleep apnea (adult) (pediatric): Secondary | ICD-10-CM | POA: Diagnosis not present

## 2023-08-05 DIAGNOSIS — Z6841 Body Mass Index (BMI) 40.0 and over, adult: Secondary | ICD-10-CM | POA: Insufficient documentation

## 2023-08-05 DIAGNOSIS — M179 Osteoarthritis of knee, unspecified: Secondary | ICD-10-CM

## 2023-08-05 DIAGNOSIS — Z79899 Other long term (current) drug therapy: Secondary | ICD-10-CM | POA: Diagnosis not present

## 2023-08-05 DIAGNOSIS — I10 Essential (primary) hypertension: Secondary | ICD-10-CM | POA: Insufficient documentation

## 2023-08-05 DIAGNOSIS — Z7985 Long-term (current) use of injectable non-insulin antidiabetic drugs: Secondary | ICD-10-CM | POA: Diagnosis not present

## 2023-08-05 DIAGNOSIS — K219 Gastro-esophageal reflux disease without esophagitis: Secondary | ICD-10-CM | POA: Diagnosis not present

## 2023-08-05 DIAGNOSIS — D649 Anemia, unspecified: Secondary | ICD-10-CM | POA: Diagnosis not present

## 2023-08-05 DIAGNOSIS — Z96652 Presence of left artificial knee joint: Secondary | ICD-10-CM | POA: Diagnosis present

## 2023-08-05 DIAGNOSIS — Z8673 Personal history of transient ischemic attack (TIA), and cerebral infarction without residual deficits: Secondary | ICD-10-CM | POA: Insufficient documentation

## 2023-08-05 DIAGNOSIS — M1712 Unilateral primary osteoarthritis, left knee: Secondary | ICD-10-CM | POA: Insufficient documentation

## 2023-08-05 DIAGNOSIS — E119 Type 2 diabetes mellitus without complications: Secondary | ICD-10-CM | POA: Insufficient documentation

## 2023-08-05 DIAGNOSIS — Z791 Long term (current) use of non-steroidal anti-inflammatories (NSAID): Secondary | ICD-10-CM | POA: Insufficient documentation

## 2023-08-05 DIAGNOSIS — R8271 Bacteriuria: Secondary | ICD-10-CM

## 2023-08-05 DIAGNOSIS — F32A Depression, unspecified: Secondary | ICD-10-CM | POA: Diagnosis not present

## 2023-08-05 DIAGNOSIS — M797 Fibromyalgia: Secondary | ICD-10-CM | POA: Insufficient documentation

## 2023-08-05 DIAGNOSIS — R519 Headache, unspecified: Secondary | ICD-10-CM | POA: Diagnosis not present

## 2023-08-05 DIAGNOSIS — Z01812 Encounter for preprocedural laboratory examination: Secondary | ICD-10-CM

## 2023-08-05 DIAGNOSIS — E66813 Obesity, class 3: Secondary | ICD-10-CM | POA: Insufficient documentation

## 2023-08-05 HISTORY — PX: TOTAL KNEE ARTHROPLASTY: SHX125

## 2023-08-05 LAB — GLUCOSE, CAPILLARY
Glucose-Capillary: 155 mg/dL — ABNORMAL HIGH (ref 70–99)
Glucose-Capillary: 225 mg/dL — ABNORMAL HIGH (ref 70–99)
Glucose-Capillary: 286 mg/dL — ABNORMAL HIGH (ref 70–99)
Glucose-Capillary: 319 mg/dL — ABNORMAL HIGH (ref 70–99)

## 2023-08-05 SURGERY — ARTHROPLASTY, KNEE, TOTAL
Anesthesia: Spinal | Site: Knee | Laterality: Left

## 2023-08-05 MED ORDER — LIDOCAINE HCL (CARDIAC) PF 100 MG/5ML IV SOSY
PREFILLED_SYRINGE | INTRAVENOUS | Status: DC | PRN
  Administered 2023-08-05: 50 mg via INTRAVENOUS

## 2023-08-05 MED ORDER — FERROUS SULFATE 325 (65 FE) MG PO TABS
325.0000 mg | ORAL_TABLET | Freq: Every day | ORAL | Status: DC
  Administered 2023-08-05 – 2023-08-06 (×2): 325 mg via ORAL
  Filled 2023-08-05: qty 1

## 2023-08-05 MED ORDER — ONDANSETRON HCL 4 MG/2ML IJ SOLN: 4.0000 mg | Freq: Once | INTRAMUSCULAR | Status: DC | PRN

## 2023-08-05 MED ORDER — PROPOFOL 10 MG/ML IV BOLUS
INTRAVENOUS | Status: DC | PRN
  Administered 2023-08-05 (×2): 20 mg via INTRAVENOUS

## 2023-08-05 MED ORDER — MENTHOL 3 MG MT LOZG: 1.0000 | LOZENGE | OROMUCOSAL | Status: DC | PRN

## 2023-08-05 MED ORDER — INSULIN ASPART 100 UNIT/ML IJ SOLN
0.0000 [IU] | Freq: Three times a day (TID) | INTRAMUSCULAR | Status: DC
  Administered 2023-08-05: 8 [IU] via SUBCUTANEOUS
  Administered 2023-08-06: 3 [IU] via SUBCUTANEOUS
  Filled 2023-08-05 (×2): qty 1

## 2023-08-05 MED ORDER — ZOLPIDEM TARTRATE 5 MG PO TABS
ORAL_TABLET | ORAL | Status: AC
Start: 2023-08-05 — End: 2023-08-05
  Filled 2023-08-05: qty 1

## 2023-08-05 MED ORDER — HYDROCHLOROTHIAZIDE 12.5 MG PO TABS
6.2500 mg | ORAL_TABLET | Freq: Every day | ORAL | Status: DC
  Administered 2023-08-06: 6.25 mg via ORAL
  Filled 2023-08-05 (×2): qty 1

## 2023-08-05 MED ORDER — CEFAZOLIN SODIUM-DEXTROSE 2-4 GM/100ML-% IV SOLN
INTRAVENOUS | Status: AC
  Filled 2023-08-05: qty 100

## 2023-08-05 MED ORDER — ORAL CARE MOUTH RINSE: 15.0000 mL | Freq: Once | OROMUCOSAL | Status: AC

## 2023-08-05 MED ORDER — METOCLOPRAMIDE HCL 10 MG PO TABS: 5.0000 mg | ORAL_TABLET | Freq: Three times a day (TID) | ORAL | Status: DC | PRN

## 2023-08-05 MED ORDER — TRANEXAMIC ACID-NACL 1000-0.7 MG/100ML-% IV SOLN
INTRAVENOUS | Status: DC | PRN
  Administered 2023-08-05 (×2): 1000 mg via INTRAVENOUS

## 2023-08-05 MED ORDER — OXYCODONE HCL 5 MG/5ML PO SOLN: 5.0000 mg | Freq: Once | ORAL | Status: DC | PRN

## 2023-08-05 MED ORDER — DEXAMETHASONE SODIUM PHOSPHATE 10 MG/ML IJ SOLN
INTRAMUSCULAR | Status: AC
  Filled 2023-08-05: qty 1

## 2023-08-05 MED ORDER — ROSUVASTATIN CALCIUM 10 MG PO TABS
10.0000 mg | ORAL_TABLET | Freq: Every day | ORAL | Status: DC
  Administered 2023-08-06: 10 mg via ORAL
  Filled 2023-08-05: qty 1

## 2023-08-05 MED ORDER — ACETAMINOPHEN 500 MG PO TABS
1000.0000 mg | ORAL_TABLET | Freq: Three times a day (TID) | ORAL | Status: DC
  Administered 2023-08-05 (×2): 1000 mg via ORAL
  Filled 2023-08-05: qty 2

## 2023-08-05 MED ORDER — PHENOL 1.4 % MT LIQD: 1.0000 | OROMUCOSAL | Status: DC | PRN

## 2023-08-05 MED ORDER — SODIUM CHLORIDE (PF) 0.9 % IJ SOLN
INTRAMUSCULAR | Status: DC | PRN
  Administered 2023-08-05: 71 mL via INTRAMUSCULAR

## 2023-08-05 MED ORDER — PHENYLEPHRINE HCL-NACL 20-0.9 MG/250ML-% IV SOLN
INTRAVENOUS | Status: AC
  Filled 2023-08-05: qty 250

## 2023-08-05 MED ORDER — PROPOFOL 1000 MG/100ML IV EMUL
INTRAVENOUS | Status: AC
  Filled 2023-08-05: qty 100

## 2023-08-05 MED ORDER — ACETAMINOPHEN 500 MG PO TABS
ORAL_TABLET | ORAL | Status: AC
  Filled 2023-08-05: qty 2

## 2023-08-05 MED ORDER — CEFAZOLIN SODIUM-DEXTROSE 2-4 GM/100ML-% IV SOLN
2.0000 g | Freq: Four times a day (QID) | INTRAVENOUS | Status: AC
  Administered 2023-08-05 (×2): 2 g via INTRAVENOUS
  Filled 2023-08-05 (×2): qty 100

## 2023-08-05 MED ORDER — ONDANSETRON HCL 4 MG PO TABS: 4.0000 mg | ORAL_TABLET | Freq: Four times a day (QID) | ORAL | Status: DC | PRN

## 2023-08-05 MED ORDER — TRAMADOL HCL 50 MG PO TABS
50.0000 mg | ORAL_TABLET | Freq: Four times a day (QID) | ORAL | Status: DC | PRN
  Administered 2023-08-05: 50 mg via ORAL
  Filled 2023-08-05: qty 1

## 2023-08-05 MED ORDER — SCOPOLAMINE 1 MG/3DAYS TD PT72
1.0000 | MEDICATED_PATCH | TRANSDERMAL | Status: DC
  Administered 2023-08-05: 1.5 mg via TRANSDERMAL

## 2023-08-05 MED ORDER — SCOPOLAMINE 1 MG/3DAYS TD PT72
MEDICATED_PATCH | TRANSDERMAL | Status: AC
  Filled 2023-08-05: qty 1

## 2023-08-05 MED ORDER — MIDAZOLAM HCL 2 MG/2ML IJ SOLN
INTRAMUSCULAR | Status: AC
  Filled 2023-08-05: qty 2

## 2023-08-05 MED ORDER — BISOPROLOL FUMARATE 5 MG PO TABS
2.5000 mg | ORAL_TABLET | Freq: Every day | ORAL | Status: DC
  Administered 2023-08-06: 2.5 mg via ORAL
  Filled 2023-08-05 (×2): qty 0.5

## 2023-08-05 MED ORDER — ONDANSETRON HCL 4 MG/2ML IJ SOLN
INTRAMUSCULAR | Status: AC
  Filled 2023-08-05: qty 2

## 2023-08-05 MED ORDER — INSULIN ASPART 100 UNIT/ML IJ SOLN
0.0000 [IU] | Freq: Every day | INTRAMUSCULAR | Status: DC
  Administered 2023-08-05: 4 [IU] via SUBCUTANEOUS

## 2023-08-05 MED ORDER — LACTATED RINGERS IV SOLN: INTRAVENOUS | Status: DC

## 2023-08-05 MED ORDER — PRAMIPEXOLE DIHYDROCHLORIDE 1 MG PO TABS
1.5000 mg | ORAL_TABLET | Freq: Every day | ORAL | Status: DC
  Administered 2023-08-05: 1.5 mg via ORAL
  Filled 2023-08-05: qty 2

## 2023-08-05 MED ORDER — ENOXAPARIN SODIUM 30 MG/0.3ML IJ SOSY
30.0000 mg | PREFILLED_SYRINGE | Freq: Two times a day (BID) | INTRAMUSCULAR | Status: DC
  Administered 2023-08-06: 30 mg via SUBCUTANEOUS
  Filled 2023-08-05: qty 0.3

## 2023-08-05 MED ORDER — PROPOFOL 500 MG/50ML IV EMUL
INTRAVENOUS | Status: DC | PRN
  Administered 2023-08-05: 125 ug/kg/min via INTRAVENOUS
  Administered 2023-08-05: 100 ug/kg/min via INTRAVENOUS

## 2023-08-05 MED ORDER — ZOLPIDEM TARTRATE 5 MG PO TABS
5.0000 mg | ORAL_TABLET | Freq: Every day | ORAL | Status: DC
  Administered 2023-08-05: 5 mg via ORAL

## 2023-08-05 MED ORDER — CEFAZOLIN SODIUM-DEXTROSE 2-4 GM/100ML-% IV SOLN
2.0000 g | INTRAVENOUS | Status: AC
  Administered 2023-08-05: 2 g via INTRAVENOUS

## 2023-08-05 MED ORDER — DOCUSATE SODIUM 100 MG PO CAPS
ORAL_CAPSULE | ORAL | Status: AC
  Filled 2023-08-05: qty 1

## 2023-08-05 MED ORDER — METOCLOPRAMIDE HCL 5 MG/ML IJ SOLN: 5.0000 mg | Freq: Three times a day (TID) | INTRAMUSCULAR | Status: DC | PRN

## 2023-08-05 MED ORDER — FENTANYL CITRATE (PF) 100 MCG/2ML IJ SOLN: 25.0000 ug | INTRAMUSCULAR | Status: DC | PRN

## 2023-08-05 MED ORDER — DEXAMETHASONE SODIUM PHOSPHATE 10 MG/ML IJ SOLN: 8.0000 mg | Freq: Once | INTRAMUSCULAR | Status: DC

## 2023-08-05 MED ORDER — DEXAMETHASONE SODIUM PHOSPHATE 10 MG/ML IJ SOLN
INTRAMUSCULAR | Status: DC | PRN
  Administered 2023-08-05: 10 mg via INTRAVENOUS

## 2023-08-05 MED ORDER — PANTOPRAZOLE SODIUM 40 MG PO TBEC
40.0000 mg | DELAYED_RELEASE_TABLET | Freq: Every day | ORAL | Status: DC
  Administered 2023-08-05 – 2023-08-06 (×2): 40 mg via ORAL
  Filled 2023-08-05 (×2): qty 1

## 2023-08-05 MED ORDER — HYDROCODONE-ACETAMINOPHEN 5-325 MG PO TABS
1.0000 | ORAL_TABLET | ORAL | Status: DC | PRN
  Administered 2023-08-06 (×2): 2 via ORAL
  Filled 2023-08-05 (×2): qty 2

## 2023-08-05 MED ORDER — MIDAZOLAM HCL 5 MG/5ML IJ SOLN
INTRAMUSCULAR | Status: DC | PRN
  Administered 2023-08-05: 2 mg via INTRAVENOUS

## 2023-08-05 MED ORDER — BISOPROLOL-HYDROCHLOROTHIAZIDE 2.5-6.25 MG PO TABS
1.0000 | ORAL_TABLET | Freq: Every day | ORAL | Status: DC
  Filled 2023-08-05: qty 1

## 2023-08-05 MED ORDER — ONDANSETRON HCL 4 MG/2ML IJ SOLN: 4.0000 mg | Freq: Four times a day (QID) | INTRAMUSCULAR | Status: DC | PRN

## 2023-08-05 MED ORDER — TRANEXAMIC ACID-NACL 1000-0.7 MG/100ML-% IV SOLN
INTRAVENOUS | Status: AC
  Filled 2023-08-05: qty 100

## 2023-08-05 MED ORDER — FERROUS SULFATE 325 (65 FE) MG PO TABS
350.0000 mg | ORAL_TABLET | Freq: Every day | ORAL | Status: DC
  Filled 2023-08-05 (×2): qty 1

## 2023-08-05 MED ORDER — CHLORHEXIDINE GLUCONATE 0.12 % MT SOLN
15.0000 mL | Freq: Once | OROMUCOSAL | Status: AC
  Administered 2023-08-05: 15 mL via OROMUCOSAL

## 2023-08-05 MED ORDER — INSULIN ASPART 100 UNIT/ML IJ SOLN
INTRAMUSCULAR | Status: AC
  Filled 2023-08-05: qty 1

## 2023-08-05 MED ORDER — OXYCODONE HCL 5 MG PO TABS: 5.0000 mg | ORAL_TABLET | Freq: Once | ORAL | Status: DC | PRN

## 2023-08-05 MED ORDER — PROPOFOL 10 MG/ML IV BOLUS
INTRAVENOUS | Status: AC
  Filled 2023-08-05: qty 20

## 2023-08-05 MED ORDER — DULOXETINE HCL 30 MG PO CPEP
30.0000 mg | ORAL_CAPSULE | Freq: Every day | ORAL | Status: DC
  Administered 2023-08-05: 30 mg via ORAL

## 2023-08-05 MED ORDER — SUMATRIPTAN SUCCINATE 50 MG PO TABS: 100.0000 mg | ORAL_TABLET | ORAL | Status: DC | PRN

## 2023-08-05 MED ORDER — ACETAMINOPHEN 325 MG PO TABS: 325.0000 mg | ORAL_TABLET | Freq: Four times a day (QID) | ORAL | Status: DC | PRN

## 2023-08-05 MED ORDER — DEXMEDETOMIDINE HCL IN NACL 80 MCG/20ML IV SOLN
INTRAVENOUS | Status: DC | PRN
  Administered 2023-08-05: 4 ug via INTRAVENOUS
  Administered 2023-08-05: 8 ug via INTRAVENOUS
  Administered 2023-08-05 (×3): 4 ug via INTRAVENOUS
  Administered 2023-08-05: 8 ug via INTRAVENOUS

## 2023-08-05 MED ORDER — ACETAMINOPHEN 10 MG/ML IV SOLN: 1000.0000 mg | Freq: Once | INTRAVENOUS | Status: DC | PRN

## 2023-08-05 MED ORDER — BUPIVACAINE HCL (PF) 0.5 % IJ SOLN
INTRAMUSCULAR | Status: AC
  Filled 2023-08-05: qty 10

## 2023-08-05 MED ORDER — SODIUM CHLORIDE 0.9 % IV SOLN: INTRAVENOUS | Status: DC

## 2023-08-05 MED ORDER — KETOROLAC TROMETHAMINE 15 MG/ML IJ SOLN
7.5000 mg | Freq: Four times a day (QID) | INTRAMUSCULAR | Status: AC
Start: 2023-08-05 — End: 2023-08-06
  Administered 2023-08-05 – 2023-08-06 (×4): 7.5 mg via INTRAVENOUS
  Filled 2023-08-05 (×4): qty 1

## 2023-08-05 MED ORDER — PHENYLEPHRINE HCL-NACL 20-0.9 MG/250ML-% IV SOLN
INTRAVENOUS | Status: DC | PRN
  Administered 2023-08-05: 20 ug/min via INTRAVENOUS

## 2023-08-05 MED ORDER — INSULIN ASPART 100 UNIT/ML IJ SOLN
5.0000 [IU] | Freq: Once | INTRAMUSCULAR | Status: AC
  Administered 2023-08-05: 5 [IU] via SUBCUTANEOUS

## 2023-08-05 MED ORDER — SURGIPHOR WOUND IRRIGATION SYSTEM - OPTIME: TOPICAL | Status: DC | PRN

## 2023-08-05 MED ORDER — BUPIVACAINE HCL (PF) 0.5 % IJ SOLN
INTRAMUSCULAR | Status: DC | PRN
  Administered 2023-08-05: 2.5 mL

## 2023-08-05 MED ORDER — DOCUSATE SODIUM 100 MG PO CAPS
100.0000 mg | ORAL_CAPSULE | Freq: Two times a day (BID) | ORAL | Status: DC
  Administered 2023-08-05 – 2023-08-06 (×3): 100 mg via ORAL
  Filled 2023-08-05 (×2): qty 1

## 2023-08-05 MED ORDER — ONDANSETRON HCL 4 MG/2ML IJ SOLN
INTRAMUSCULAR | Status: DC | PRN
Start: 2023-08-05 — End: 2023-08-05
  Administered 2023-08-05: 4 mg via INTRAVENOUS

## 2023-08-05 MED ORDER — BUPIVACAINE-EPINEPHRINE (PF) 0.25% -1:200000 IJ SOLN
INTRAMUSCULAR | Status: AC
  Filled 2023-08-05: qty 30

## 2023-08-05 MED ORDER — SODIUM CHLORIDE (PF) 0.9 % IJ SOLN
INTRAMUSCULAR | Status: AC
  Filled 2023-08-05: qty 20

## 2023-08-05 MED ORDER — SODIUM CHLORIDE 0.9 % IR SOLN
Status: DC | PRN
  Administered 2023-08-05: 3000 mL

## 2023-08-05 MED ORDER — TRANEXAMIC ACID-NACL 1000-0.7 MG/100ML-% IV SOLN: 1000.0000 mg | INTRAVENOUS | Status: DC

## 2023-08-05 MED ORDER — MORPHINE SULFATE (PF) 2 MG/ML IV SOLN: 0.5000 mg | INTRAVENOUS | Status: DC | PRN

## 2023-08-05 MED ORDER — CHLORHEXIDINE GLUCONATE 0.12 % MT SOLN
OROMUCOSAL | Status: AC
  Filled 2023-08-05: qty 15

## 2023-08-05 SURGICAL SUPPLY — 60 items
BLADE PATELLA REAM PILOT HOLE (MISCELLANEOUS) IMPLANT
BLADE SAW 90X13X1.19 OSCILLAT (BLADE) IMPLANT
BLADE SAW SAG 25X90X1.19 (BLADE) ×1 IMPLANT
BLADE SAW SAG 29X58X.64 (BLADE) ×1 IMPLANT
BNDG ELASTIC 6INX 5YD STR LF (GAUZE/BANDAGES/DRESSINGS) ×1 IMPLANT
BOWL CEMENT MIX W/ADAPTER (MISCELLANEOUS) IMPLANT
BRUSH SCRUB EZ PLAIN DRY (MISCELLANEOUS) IMPLANT
CEMENT BONE R 1X40 (Cement) IMPLANT
CHLORAPREP W/TINT 26 (MISCELLANEOUS) ×2 IMPLANT
COMPONENT FEMRL CMNTDLTSZ6NRW (Joint) IMPLANT
COMPONENT MED PLY PERSS6-7 12 (Joint) IMPLANT
COOLER ICEMAN CLASSIC (MISCELLANEOUS) ×1 IMPLANT
CUFF TRNQT CYL 24X4X16.5-23 (TOURNIQUET CUFF) IMPLANT
CUFF TRNQT CYL 30X4X21-28X (TOURNIQUET CUFF) IMPLANT
DERMABOND ADVANCED .7 DNX12 (GAUZE/BANDAGES/DRESSINGS) ×1 IMPLANT
DRAPE SHEET LG 3/4 BI-LAMINATE (DRAPES) ×2 IMPLANT
DRSG MEPILEX SACRM 8.7X9.8 (GAUZE/BANDAGES/DRESSINGS) ×1 IMPLANT
DRSG OPSITE POSTOP 4X10 (GAUZE/BANDAGES/DRESSINGS) IMPLANT
DRSG OPSITE POSTOP 4X8 (GAUZE/BANDAGES/DRESSINGS) IMPLANT
ELECTRODE REM PT RTRN 9FT ADLT (ELECTROSURGICAL) ×1 IMPLANT
GLOVE BIO SURGEON STRL SZ8 (GLOVE) ×1 IMPLANT
GLOVE BIOGEL PI IND STRL 8 (GLOVE) ×1 IMPLANT
GLOVE PI ORTHO PRO STRL 7.5 (GLOVE) ×2 IMPLANT
GLOVE PI ORTHO PRO STRL SZ8 (GLOVE) ×2 IMPLANT
GLOVE SURG SYN 7.5 E (GLOVE) ×1 IMPLANT
GLOVE SURG SYN 7.5 PF PI (GLOVE) ×1 IMPLANT
GOWN SRG XL LVL 3 NONREINFORCE (GOWNS) ×1 IMPLANT
GOWN STRL REUS W/ TWL LRG LVL3 (GOWN DISPOSABLE) ×1 IMPLANT
GOWN STRL REUS W/ TWL XL LVL3 (GOWN DISPOSABLE) ×1 IMPLANT
HOOD PEEL AWAY T7 (MISCELLANEOUS) ×2 IMPLANT
KIT TURNOVER KIT A (KITS) ×1 IMPLANT
MANIFOLD NEPTUNE II (INSTRUMENTS) ×1 IMPLANT
MARKER SKIN DUAL TIP RULER LAB (MISCELLANEOUS) ×1 IMPLANT
MAT ABSORB FLUID 56X50 GRAY (MISCELLANEOUS) ×1 IMPLANT
NDL HYPO 21X1.5 SAFETY (NEEDLE) ×1 IMPLANT
NEEDLE HYPO 21X1.5 SAFETY (NEEDLE) ×1 IMPLANT
PACK TOTAL KNEE (MISCELLANEOUS) ×1 IMPLANT
PAD ARMBOARD POSITIONER FOAM (MISCELLANEOUS) ×3 IMPLANT
PAD COLD UNI WRAP-ON (PAD) ×1 IMPLANT
PENCIL SMOKE EVACUATOR (MISCELLANEOUS) ×1 IMPLANT
PIN DRILL HDLS TROCAR 75 4PK (PIN) IMPLANT
SCREW FEMALE HEX FIX 25X2.5 (ORTHOPEDIC DISPOSABLE SUPPLIES) IMPLANT
SCREW HEX HEADED 3.5X27 DISP (ORTHOPEDIC DISPOSABLE SUPPLIES) IMPLANT
SLEEVE SCD COMPRESS KNEE MED (STOCKING) ×1 IMPLANT
SOL .9 NS 3000ML IRR UROMATIC (IV SOLUTION) ×1 IMPLANT
SOLUTION IRRIG SURGIPHOR (IV SOLUTION) ×1 IMPLANT
STEM POLY PAT PLY 29M KNEE (Knees) IMPLANT
STEM TIB ST PERS 14+30 (Stem) IMPLANT
STEM TIBIA 5 DEG SZ D L KNEE (Knees) IMPLANT
STOCKINETTE IMPERV 14X48 (MISCELLANEOUS) ×1 IMPLANT
SUT STRATAFIX 14 PDO 36 VLT (SUTURE) ×1 IMPLANT
SUT VIC AB 0 CT1 36 (SUTURE) ×1 IMPLANT
SUT VIC AB 2-0 CT2 27 (SUTURE) ×2 IMPLANT
SUTURE STRATA SPIR 4-0 18 (SUTURE) ×1 IMPLANT
SUTURE VICRYL 1-0 27IN ABS (SUTURE) ×1 IMPLANT
SYR 20ML LL LF (SYRINGE) ×2 IMPLANT
TAPE CLOTH 3X10 WHT NS LF (GAUZE/BANDAGES/DRESSINGS) ×1 IMPLANT
TIP FAN IRRIG PULSAVAC PLUS (DISPOSABLE) ×1 IMPLANT
TRAP FLUID SMOKE EVACUATOR (MISCELLANEOUS) ×1 IMPLANT
WATER STERILE IRR 1000ML POUR (IV SOLUTION) ×1 IMPLANT

## 2023-08-05 NOTE — Anesthesia Procedure Notes (Addendum)
 Spinal  Patient location during procedure: OR Reason for block: surgical anesthesia Staffing Performed: anesthesiologist and resident/CRNA  Anesthesiologist: [name], MD Resident/CRNA: Romell Cluster, RN Performed by: [name], MD Authorized by: [name], MD   Preanesthetic Checklist Completed: patient identified, IV checked, site marked, risks and benefits discussed, surgical consent, monitors and equipment checked, pre-op evaluation and timeout performed Spinal Block Patient position: sitting Prep: ChloraPrep Patient monitoring: heart rate, cardiac monitor, continuous pulse ox and blood pressure Approach: midline Location: L3-4 Injection technique: single-shot Needle Needle type: Quincke  Needle gauge: 22 G Needle length: 9 cm Assessment Sensory level: T4 Events: CSF return and second provider Additional Notes Initial attempt by Denney Fisherman unable to pass due to os.  Attempt by Ervie Mccard at L2-3 via right paramedian unsuccessful due to os.  Second attempt by Karanveer Ramakrishnan midline at L3-4 with Quincke successful.

## 2023-08-05 NOTE — Transfer of Care (Signed)
 Immediate Anesthesia Transfer of Care Note  Patient: Carolyn Graham  Procedure(s) Performed: ARTHROPLASTY, KNEE, TOTAL (Left: Knee)  Patient Location: PACU  Anesthesia Type:Spinal  Level of Consciousness: awake  Airway & Oxygen Therapy: Patient Spontanous Breathing and Patient connected to face mask oxygen  Post-op Assessment: Report given to RN and Post -op Vital signs reviewed and stable  Post vital signs: Reviewed and stable  Last Vitals:  Vitals Value Taken Time  BP 111/65 08/05/23 10:10  Temp 36.5 C 08/05/23 10:10  Pulse 81 08/05/23 10:12  Resp 16 08/05/23 10:12  SpO2 95 % 08/05/23 10:12  Vitals shown include unfiled device data.  Last Pain:  Vitals:   08/05/23 0621  PainSc: 0-No pain         Complications: No notable events documented.

## 2023-08-05 NOTE — Anesthesia Preprocedure Evaluation (Addendum)
 Anesthesia Evaluation  Patient identified by MRN, date of birth, ID band Patient awake    Reviewed: Allergy & Precautions, NPO status , Patient's Chart, lab work & pertinent test results  History of Anesthesia Complications (+) PONV and history of anesthetic complications  Airway Mallampati: IV   Neck ROM: Full    Dental no notable dental hx.    Pulmonary sleep apnea and Continuous Positive Airway Pressure Ventilation    Pulmonary exam normal breath sounds clear to auscultation       Cardiovascular hypertension, Normal cardiovascular exam Rhythm:Regular Rate:Normal  ECG 07/29/23: normal   Neuro/Psych  Headaches PSYCHIATRIC DISORDERS  Depression    Vertigo; hx lumbar fusion    GI/Hepatic ,GERD  ,,Fatty liver   Endo/Other  diabetes, Type 2  Class 3 obesity  Renal/GU negative Renal ROS     Musculoskeletal  (+) Arthritis ,  Fibromyalgia -  Abdominal   Peds  Hematology  (+) Blood dyscrasia, anemia   Anesthesia Other Findings Last dose of Trulicity 07/22/23.  Reproductive/Obstetrics                              Anesthesia Physical Anesthesia Plan  ASA: 3  Anesthesia Plan: Spinal   Post-op Pain Management:    Induction: Intravenous  PONV Risk Score and Plan: 2 and Ondansetron  and Treatment may vary due to age or medical condition  Airway Management Planned: Natural Airway and Nasal Cannula  Additional Equipment:   Intra-op Plan:   Post-operative Plan:   Informed Consent: I have reviewed the patients History and Physical, chart, labs and discussed the procedure including the risks, benefits and alternatives for the proposed anesthesia with the patient or authorized representative who has indicated his/her understanding and acceptance.     Dental advisory given  Plan Discussed with: Anesthesiologist, CRNA and Surgeon  Anesthesia Plan Comments: (Plan for spinal and GA with  natural airway, LMA/GETA backup.  Patient consented for risks of anesthesia including but not limited to:  - adverse reactions to medications - damage to eyes, teeth, lips or other oral mucosa - nerve damage due to positioning  - sore throat or hoarseness - headache, bleeding, infection, nerve damage 2/2 spinal - damage to heart, brain, nerves, lungs, other parts of body or loss of life  Informed patient about role of CRNA in peri- and intra-operative care.  Patient voiced understanding.)         Anesthesia Quick Evaluation

## 2023-08-05 NOTE — TOC Initial Note (Signed)
 Transition of Care [facility]) - Initial/Assessment Note    Patient Details  Name: Carolyn Graham MRN: [id number] Date of Birth: Nov 28, 1955  Transition of Care [facility]) CM/SW Contact:    Alexandra Ice, RN Phone Number: 08/05/2023, 12:05 PM  Clinical Narrative:                  Patient lives with spouse. She has PCP in the community. Patient set up with [facility] by surgeon's office prior to surgery. Added agency information to AVS. TOC will follow any additional discharge needs.       Patient Goals and CMS Choice            Expected Discharge Plan and Services                                              Prior Living Arrangements/Services                       Activities of Daily Living   ADL Screening (condition at time of admission) Independently performs ADLs?: Yes (appropriate for developmental age)  Permission Sought/Granted                  Emotional Assessment              Admission diagnosis:  Primary osteoarthritis of left knee [M17.12] S/P TKR (total knee replacement), left [Z96.652] Patient Active Problem List   Diagnosis Date Noted   S/P TKR (total knee replacement), left 08/05/2023   Spondylolisthesis at L4-L5 level 10/22/2022   OSA on CPAP 01/27/2021   Diabetes mellitus without complication (HCC) 06/13/2018   Primary osteoarthritis of both hands 08/30/2017   Primary osteoarthritis of both knees 08/30/2017   DDD (degenerative disc disease), cervical 08/30/2017   DDD (degenerative disc disease), lumbar 08/30/2017   History of insomnia 08/30/2017   Fatty liver 08/30/2017   History of TIA (transient ischemic attack) 08/30/2017   S/p bilateral carpal tunnel release 08/30/2017   History of gastroesophageal reflux (GERD) 08/30/2017   History of vitamin D deficiency 08/30/2017   Spondylolisthesis of lumbar region 01/12/2017   Benign essential hypertension 05/06/2016   Oral herpes 05/06/2016   Carpal tunnel syndrome  of left wrist 11/08/2015   Hyperlipidemia, unspecified 05/10/2015   Osteopenia 11/05/2014   Closed fracture of fibula 01/09/2014   Sleep apnea 01/06/2011   Restless leg syndrome 01/06/2011   Insomnia 01/06/2011   Fibromyalgia 01/06/2011   GERD (gastroesophageal reflux disease) 12/15/2007   PCP:  Rory Collard, MD Pharmacy:   [id number] [place], [place] - [address] AT [facility] [address] [place] [address] Phone: [phone] Fax: [phone]     Social Drivers of Health (SDOH) Social History: SDOH Screenings   Food Insecurity: No Food Insecurity (08/05/2023)  Housing: Low Risk  (08/05/2023)  Recent Concern: Housing - High Risk (06/08/2023)   Received from [facility]  Transportation Needs: No Transportation Needs (08/05/2023)  Utilities: Not At Risk (08/05/2023)  Financial Resource Strain: Low Risk  (07/29/2023)   Received from [facility]  Social Connections: Moderately Integrated (08/05/2023)  Tobacco Use: Low Risk  (08/05/2023)   SDOH Interventions:     Readmission Risk Interventions     No data to display

## 2023-08-05 NOTE — Discharge Instructions (Addendum)
 [facility] They will call you to set up when they are coming out to see you   [address], [place], [place] [address] Hours:  Open ? Closes 5?PM Phone: [phone]  TOTAL KNEE REPLACEMENT POSTOPERATIVE DIRECTIONS  Knee Rehabilitation, Guidelines Following Surgery  Results after knee surgery are often greatly improved when you follow the exercise, range of motion and muscle strengthening exercises prescribed by your doctor. Safety measures are also important to protect the knee from further injury. Any time any of these exercises cause you to have increased pain or swelling in your knee joint, decrease the amount until you are comfortable again and slowly increase them. If you have problems or questions, call your caregiver or physical therapist for advice.   HOME CARE INSTRUCTIONS  Remove items at home which could result in a fall. This includes throw rugs or furniture in walking pathways.  ICE using the Polar Care unit to the affected knee every three hours for 30 minutes at a time and then as needed for pain and swelling.  Place a dry towel or pillow case over the knee before applying the Polar Care Unit.  Continue to use ice on the knee for pain and swelling from surgery. You may notice swelling that will progress down to the foot and ankle.  This is normal after surgery.  Elevate the leg when you are not up walking on it.   Continue to use the breathing machine which will help keep your temperature down.  It is common for your temperature to cycle up and down following surgery, especially at night when you are not up moving around and exerting yourself.  The breathing machine keeps your lungs expanded and your temperature down. Do not place pillow under knee, focus on keeping the knee straight while resting  DIET You may resume your previous home diet once your are discharged from the hospital.  DRESSING / WOUND CARE / SHOWERING The dressing can be removed at 7 days after surgery by  physical therapy.  New dressing only as needed, there is glue beneath the dressing. You need to keep your wound dry after being discharged home.  Just keep the incision dry. Change the surgical dressing only if needed and reapply a dry dressing each time.  ACTIVITY Walk with your walker as instructed. Use walker as long as suggested by your caregivers. Avoid periods of inactivity such as sitting longer than an hour when not asleep. This helps prevent blood clots.  You may resume a sexual relationship in one month or when given the OK by your doctor.  You may return to work once you are cleared by your doctor.  Do not drive a car for 6 weeks or until released by you surgeon.  Do not drive while taking narcotics.  WEIGHT BEARING Weight bearing as tolerated with assist device (walker, cane, etc) as directed, use it as long as suggested by your surgeon or therapist, typically at least 4-6 weeks.  POSTOPERATIVE CONSTIPATION PROTOCOL Constipation - defined medically as fewer than three stools per week and severe constipation as less than one stool per week.  One of the most common issues patients have following surgery is constipation.  Even if you have a regular bowel pattern at home, your normal regimen is likely to be disrupted due to multiple reasons following surgery.  Combination of anesthesia, postoperative narcotics, change in appetite and fluid intake all can affect your bowels.  In order to avoid complications following surgery, here are  some recommendations in order to help you during your recovery period.  Colace (docusate) - Pick up an over-the-counter form of Colace or another stool softener and take twice a day as long as you are requiring postoperative pain medications.  Take with a full glass of water daily.  If you experience loose stools or diarrhea, hold the colace until you stool forms back up.  If your symptoms do not get better within 1 week or if they get worse, check with your  doctor.  Dulcolax (bisacodyl ) - Pick up over-the-counter and take as directed by the product packaging as needed to assist with the movement of your bowels.  Take with a full glass of water.  Use this product as needed if not relieved by Colace only.   MiraLax  (polyethylene glycol) - Pick up over-the-counter to have on hand.  MiraLax  is a solution that will increase the amount of water in your bowels to assist with bowel movements.  Take as directed and can mix with a glass of water, juice, soda, coffee, or tea.  Take if you go more than two days without a movement. Do not use MiraLax  more than once per day. Call your doctor if you are still constipated or irregular after using this medication for 7 days in a row.  If you continue to have problems with postoperative constipation, please contact the office for further assistance and recommendations.  If you experience the worst abdominal pain ever or develop nausea or vomiting, please contact the office immediatly for further recommendations for treatment.  ITCHING  If you experience itching with your medications, try taking only a single pain pill, or even half a pain pill at a time.  You can also use Benadryl over the counter for itching or also to help with sleep.   TED HOSE STOCKINGS Wear the elastic stockings on both legs for six weeks following surgery during the day but you may remove then at night for sleeping.  MEDICATIONS See your medication summary on the "After Visit Summary" that the nursing staff will review with you prior to discharge.  You may have some home medications which will be placed on hold until you complete the course of blood thinner medication.  It is important for you to complete the blood thinner medication as prescribed by your surgeon.  Continue your approved medications as instructed at time of discharge.  PRECAUTIONS If you experience chest pain or shortness of breath - call 911 immediately for transfer to the  hospital emergency department.  If you develop a fever greater that 101 F, purulent drainage from wound, increased redness or drainage from wound, foul odor from the wound/dressing, or calf pain - CONTACT YOUR SURGEON.                                                   FOLLOW-UP APPOINTMENTS Follow-up at the [facility] clinic in 2 weeks for wound check with Dr. Abdomen and his office.  Make sure you keep all of your appointments after your operation with your surgeon and caregivers. You should call the office at the above phone number and make an appointment for approximately two weeks after the date of your surgery or on the date instructed by your surgeon outlined in the After Visit Summary.   RANGE OF MOTION AND STRENGTHENING EXERCISES  Rehabilitation of  the knee is important following a knee injury or an operation. After just a few days of immobilization, the muscles of the thigh which control the knee become weakened and shrink (atrophy). Knee exercises are designed to build up the tone and strength of the thigh muscles and to improve knee motion. Often times heat used for twenty to thirty minutes before working out will loosen up your tissues and help with improving the range of motion but do not use heat for the first two weeks following surgery. These exercises can be done on a training (exercise) mat, on the floor, on a table or on a bed. Use what ever works the best and is most comfortable for you Knee exercises include:  Leg Lifts - While your knee is still immobilized in a splint or cast, you can do straight leg raises. Lift the leg to 60 degrees, hold for 3 sec, and slowly lower the leg. Repeat 10-20 times 2-3 times daily. Perform this exercise against resistance later as your knee gets better.  Quad and Hamstring Sets - Tighten up the muscle on the front of the thigh (Quad) and hold for 5-10 sec. Repeat this 10-20 times hourly. Hamstring sets are done by pushing the foot backward against an  object and holding for 5-10 sec. Repeat as with quad sets.  Leg Slides: Lying on your back, slowly slide your foot toward your buttocks, bending your knee up off the floor (only go as far as is comfortable). Then slowly slide your foot back down until your leg is flat on the floor again. Angel Wings: Lying on your back spread your legs to the side as far apart as you can without causing discomfort.  A rehabilitation program following serious knee injuries can speed recovery and prevent re-injury in the future due to weakened muscles. Contact your doctor or a physical therapist for more information on knee rehabilitation.   IF YOU ARE TRANSFERRED TO A SKILLED REHAB FACILITY If the patient is transferred to a skilled rehab facility following release from the hospital, a list of the current medications will be sent to the facility for the patient to continue.  When discharged from the skilled rehab facility, please have the facility set up the patient's Home Health Physical Therapy prior to being released. Also, the skilled facility will be responsible for providing the patient with their medications at time of release from the facility to include their pain medication, the muscle relaxants, and their blood thinner medication. If the patient is still at the rehab facility at time of the two week follow up appointment, the skilled rehab facility will also need to assist the patient in arranging follow up appointment in our office and any transportation needs.  MAKE SURE YOU:  Understand these instructions.  Get help right away if you are not doing well or get worse.    Pick up stool softner and laxative for home use following surgery while on pain medications. Please use good hand washing techniques while changing dressing. Continue to use ice for pain and swelling after surgery. Do not use any lotions or creams on the incision until instructed by your surgeon.

## 2023-08-05 NOTE — Evaluation (Signed)
 Physical Therapy Evaluation Patient Details Name: Carolyn Graham MRN: [id number] DOB: 07/31/1955 Today's Date: 08/05/2023  History of Present Illness  68 yo female s/p L TKA. PMH arthritis, DM2, fatty liver, fibromyalgia, PLIF L4-L5, GERD, HTN, schatzki rings, stress incontinence, TIA, OSA uses CPAP, hyperthyroidism, vertigo, R shoulder surg, lumbar surgy 2018.  Clinical Impression  Patient alert, agreeable to PT, up in bathroom with nursing staff. At baseline the pt reported she is independent, lives with family. She was able to stand from commode with with CGA, and cues for hand placement. She did ambulate an additional 34ft with RW and CGA, antalgic gait needed. Pt given HEP handout, able to perform LAQ/heel slides without assist.  Overall the patient demonstrated deficits (see PT Problem List) that impede the patient's functional abilities, safety, and mobility and would benefit from skilled PT intervention.          If plan is discharge home, recommend the following: A little help with walking and/or transfers;A little help with bathing/dressing/bathroom;Assist for transportation;Direct supervision/assist for medications management;Help with stairs or ramp for entrance   Can travel by private vehicle        Equipment Recommendations Rolling walker (2 wheels)  Recommendations for Other Services       Functional Status Assessment Patient has had a recent decline in their functional status and demonstrates the ability to make significant improvements in function in a reasonable and predictable amount of time.     Precautions / Restrictions Precautions Precautions: Fall Recall of Precautions/Restrictions: Intact Restrictions Weight Bearing Restrictions Per Provider Order: Yes LLE Weight Bearing Per Provider Order: Weight bearing as tolerated      Mobility  Bed Mobility               General bed mobility comments: pt up ihn bathroom with nursing    Transfers Overall  transfer level: Needs assistance Equipment used: Rolling walker (2 wheels) Transfers: Sit to/from Stand Sit to Stand: Supervision                Ambulation/Gait Ambulation/Gait assistance: Contact guard assist Gait Distance (Feet): 50 Feet Assistive device: Rolling walker (2 wheels)         General Gait Details: antalgic gait  Stairs            Wheelchair Mobility     Tilt Bed    Modified Rankin (Stroke Patients Only)       Balance Overall balance assessment: Needs assistance Sitting-balance support: Feet supported Sitting balance-Leahy Scale: Good     Standing balance support: During functional activity, Reliant on assistive device for balance Standing balance-Leahy Scale: Fair                               Pertinent Vitals/Pain Pain Assessment Pain Assessment: No/denies pain    Home Living Family/patient expects to be discharged to:: Private residence Living Arrangements: Spouse/significant other Available Help at Discharge: Family;Available 24 hours/day Type of Home: House Home Access: Stairs to enter Entrance Stairs-Rails: None Entrance Stairs-Number of Steps: 2 or 5 with bilateral rails Alternate Level Stairs-Number of Steps: Plans to stay in studio in basement with bedroom and full bath. Flight of stairs to access or level outdoor entry. Home Layout: Two level Home Equipment: BSC/3in1;Rolling Walker (2 wheels);Adaptive equipment;Hand held shower head      Prior Function Prior Level of Function : Independent/Modified Independent;Driving  Extremity/Trunk Assessment   Upper Extremity Assessment Upper Extremity Assessment: Overall WFL for tasks assessed    Lower Extremity Assessment Lower Extremity Assessment:  (able to SLR bilaterally)       Communication        Cognition Arousal: Alert Behavior During Therapy: WFL for tasks assessed/performed   PT - Cognitive impairments: No apparent  impairments                         Following commands: Intact       Cueing       General Comments      Exercises     Assessment/Plan    PT Assessment Patient needs continued PT services  PT Problem List Decreased strength;Pain;Decreased range of motion;Decreased activity tolerance;Decreased knowledge of use of DME;Decreased balance;Decreased mobility;Decreased knowledge of precautions       PT Treatment Interventions DME instruction;Balance training;Gait training;Neuromuscular re-education;Stair training;Functional mobility training;Patient/family education;Therapeutic activities;Therapeutic exercise    PT Goals (Current goals can be found in the Care Plan section)  Acute Rehab PT Goals Patient Stated Goal: to go home PT Goal Formulation: With patient Time For Goal Achievement: 08/19/23 Potential to Achieve Goals: Good    Frequency BID     Co-evaluation               AM-PAC PT 6 Clicks Mobility  Outcome Measure Help needed turning from your back to your side while in a flat bed without using bedrails?: A Little Help needed moving from lying on your back to sitting on the side of a flat bed without using bedrails?: A Little Help needed moving to and from a bed to a chair (including a wheelchair)?: A Little Help needed standing up from a chair using your arms (e.g., wheelchair or bedside chair)?: A Little Help needed to walk in hospital room?: A Little Help needed climbing 3-5 steps with a railing? : A Little 6 Click Score: 18    End of Session Equipment Utilized During Treatment: Gait belt Activity Tolerance: Patient tolerated treatment well Patient left: in chair;with call bell/phone within reach Nurse Communication: Mobility status PT Visit Diagnosis: Other abnormalities of gait and mobility (R26.89);Difficulty in walking, not elsewhere classified (R26.2);Muscle weakness (generalized) (M62.81);Pain Pain - Right/Left: Left Pain - part of body:  Knee    Time: 1425-1445 PT Time Calculation (min) (ACUTE ONLY): 20 min   Charges:   PT Evaluation $PT Eval Low Complexity: 1 Low PT Treatments $Therapeutic Activity: 8-22 mins PT General Charges $$ ACUTE PT VISIT: 1 Visit        Darien Eden PT, DPT 3:27 PM,08/05/23

## 2023-08-05 NOTE — Op Note (Signed)
 Patient Name: Carolyn Graham  ZOX:[id number]  Pre-Operative Diagnosis: Left knee Osteoarthritis  Post-Operative Diagnosis: (same)  Procedure: Left Total Knee Arthroplasty  Components/Implants: Femur: Persona Size 6 Narrow Nitrided Titanium    Tibia: Persona Size D w/ 14x75m stem extension  Poly: 12mm MC  Patella: 29x8 symmetric   Femoral Valgus Cut Angle: 5 degrees  Distal Femoral Re-cut: none  Patella Resurfacing: yes   Date of Surgery: 08/05/2023  Surgeon: Venus Ginsberg MD  Assistant: [facility] RNFA (present and scrubbed throughout the case, critical for assistance with exposure, retraction, instrumentation, and closure)   Anesthesiologist: Mazzoni  Anesthesia: Spinal   Tourniquet Time: 69 min  EBL: 50cc  IVF: 500cc  Complications: None   Brief history: The patient is a 68 year old female with a history of osteoarthritis of the left knee with pain limiting their range of motion and activities of daily living, which has failed multiple attempts at conservative therapy.  The risks and benefits of total knee arthroplasty as definitive surgical treatment were discussed with the patient, who opted to proceed with the operation.  After outpatient medical clearance and optimization was completed the patient was admitted to [facility] for the procedure.  All preoperative films were reviewed and an appropriate surgical plan was made prior to surgery. Preoperative range of motion was 0 to 115  Description of procedure: The patient was brought to the operating room where laterality was confirmed by all those present to be the left side.   Spinal anesthesia was administered and the patient received an intravenous dose of antibiotics for surgical prophylaxis and a dose of tranexamic acid.  Patient is positioned supine on the operating room table with all bony prominences well-padded.  A well-padded tourniquet was applied to the left thigh.  The knee was then prepped  and draped in usual sterile fashion with multiple layers of adhesive and nonadhesive drapes.  All of those present in the operating room participated in a surgical timeout laterality and patient were confirmed.   An Esmarch was wrapped around the extremity and the leg was elevated and the knee flexed.  The tourniquet was inflated to a pressure of 250 mmHg. The Esmarch was removed and the leg was brought down to full extension.  The patella and tibial tubercle identified and outlined using a marking pen and a midline skin incision was made with a knife carried through the subcutaneous tissue down to the extensor retinaculum.  After exposure of the extensor mechanism the medial parapatellar arthrotomy was performed with a scalpel and electrocautery extending down medial and distal to the tibial tubercle taking care to avoid incising the patellar tendon.   A standard medial release was performed over the proximal tibia.  The knee was brought into extension in order to excise the fat pad taking care not to damage the patella tendon.  The superior soft tissue was removed from the anterior surface of the distal femur to visualize for the procedure.  The knee was then brought into flexion with the patella subluxed laterally and subluxing the tibia anteriorly.  The ACL was transected and removed with electrocautery and additional soft tissue was removed from the proximal surface of the tibia to fully expose. The PCL was found to be intact and was preserved.  An extramedullary tibial cutting guide was then applied to the leg with a spring-loaded ankle clamp placed around the distal tibia just above the malleoli the angulation of the guide was adjusted to give some posterior slope in the  tibial resection with an appropriate varus/valgus alignment.  The resection guide was then pinned to the proximal tibia and the proximal tibial surface was resected with an oscillating saw.  Careful attention was paid to ensure the blade  did not disrupt any of the soft tissues including any lateral or medial ligament.  Attention was then turned to the femur, with the knee slightly flexed a opening drill was used to enter the medullary canal of the femur.  After removing the drill marrow was suctioned out to decompress the distal femur.  An intramedullary femoral guide was then inserted into the drill hole and the alignment guide was seated firmly against the distal end of the medial femoral condyle.  The distal femoral cutting guide was then attached and pinned securely to the anterior surface of the femur and the intramedullary rod and alignment guide was removed.  Distal femur resection was then performed with an oscillating saw with retractors protecting medial and laterally.   The distal cutting block was then removed and the extension gap was checked with a spacer.  Extension gap was found to be appropriately sized to accommodate the spacer block.   The femoral sizing guide was then placed securely into the posterior condyles of the femur and the femoral size was measured and determined to be 6.  The size 6; 4-in-1 cutting guide was placed in position and secured with 2 pins.  The anterior posterior and chamfer resections were then performed with an oscillating saw.  Bony fragments and osteophytes were then removed.  Using a lamina spreader the posterior medial and lateral condyles were checked for additional osteophytes and posterior soft tissue remnants.  Any remaining meniscus was removed at this time.  Periarticular injection was performed in the meniscal rims and posterior capsule with aspiration performed to ensure no intravascular injection.   The tibia was then exposed and the tibial trial was pinned onto the plateau after confirming appropriate orientation and rotation.  Using the drill bushing the tibia was prepared to the appropriate drill depth.  Tibial broach impactor was then driven through the punch guide using a mallet.   The femoral trial component was then inserted onto the femur. A trial tibial polyethylene bearing was then placed and the knee was reduced.  The knee achieved full extension with no hyperextension and was found to be balanced in flexion and extension with the trials in place.  The knee was then brought into full extension the patella was everted and held with 2 Kocher clamps.  The articular surface of the patella was then resected with an patella reamer and saw after careful measurement with a caliper.  The patella was then prepared with the drill guide and a trial patella was placed.  The knee was then taken through range of motion and it was found that the patella articulated appropriately with the trochlea and good patellofemoral motion without subluxation.    The correct final components for implantation were confirmed and opened by the circulator nurse.  A bone plug was fashioned from the patient's bone cuts to plug the intramedullary femoral canal access hole and was tamped into place.  The prepared surfaces of the patella femur and tibia were cleaned with pulsatile lavage to remove all blood fat and other material and then the surfaces were dried.  2 bags of cement were mixed under vacuum and the components were cemented into place.  Excess cement was removed with curettes and forceps. A trial polyethylene tibial component was placed  and the knee was brought into extension to allow the cement to set.  At this time the periarticular injection cocktail was placed in the soft tissues surrounding the knee.  After full curing of the cement the balance of the knee was checked again and the final polyethylene size was confirmed. The tibial component was irrigated and locking mechanism checked to ensure it was clear of debris. The real polyethylene tibial component was implanted and the knee was brought through a range of motion.   The knee was then irrigated with copious amount of normal saline via pulsatile  lavage to remove all loose bodies and other debris.  The knee was then irrigated with surgiphor betadine  based wash and reirrigated with saline.  The tourniquet was then dropped and all bleeding vessels were identified and coagulated.  The arthrotomy was approximated with #1 Vicryl and closed with #1 Stratafix suture.  The knee was brought into slight flexion and the subcutaneous tissues were closed with 0 Vicryl, 2-0 Vicryl and a running subcuticular 4-0 stratafix barbed suture.  Skin was then glued with Dermabond.  A sterile adhesive dressing was then placed along with a sequential compression device to the calf, a Ted stocking, and a cryotherapy cuff.   Sponge, needle, and Lap counts were all correct at the end of the case.   The patient was transferred off of the operating room table to a hospital bed, good pulses were found distally on the operative side.  The patient was transferred to the recovery room in stable condition.

## 2023-08-05 NOTE — Interval H&P Note (Signed)
 Patient history and physical updated. Consent reviewed including risks, benefits, and alternatives to surgery. Patient agrees with above plan to proceed with left total knee arthroplasty.

## 2023-08-05 NOTE — Anesthesia Postprocedure Evaluation (Signed)
 Anesthesia Post Note  Patient: Carolyn Graham  Procedure(s) Performed: ARTHROPLASTY, KNEE, TOTAL (Left: Knee)  Patient location during evaluation: PACU Anesthesia Type: Spinal Level of consciousness: oriented and awake and alert Pain management: pain level controlled Vital Signs Assessment: post-procedure vital signs reviewed and stable Respiratory status: spontaneous breathing, respiratory function stable and patient connected to nasal cannula oxygen Cardiovascular status: blood pressure returned to baseline and stable Postop Assessment: no headache, no backache and no apparent nausea or vomiting Anesthetic complications: no   No notable events documented.   Last Vitals:  Vitals:   08/05/23 1045 08/05/23 1055  BP: 110/65 110/65  Pulse: 72 72  Resp: 12 12  Temp:  36.5 C  SpO2: 91%     Last Pain:  Vitals:   08/05/23 1055  TempSrc: Temporal  PainSc:                  [name]

## 2023-08-05 NOTE — Anesthesia Procedure Notes (Deleted)
 Spinal  Patient location during procedure: OR Start time: 08/05/2023 7:30 AM End time: 08/05/2023 7:40 AM Reason for block: surgical anesthesia Staffing Anesthesiologist: Mazzoni, Andrea, MD Other anesthesia staff: Romell Cluster, RN Performed by: Noelia Batman, CRNA Authorized by: Noelia Batman, CRNA   Preanesthetic Checklist Completed: patient identified, IV checked, site marked, risks and benefits discussed, surgical consent, monitors and equipment checked, pre-op evaluation and timeout performed Spinal Block Patient position: sitting Prep: DuraPrep Patient monitoring: heart rate, cardiac monitor, continuous pulse ox and blood pressure Approach: right paramedian Location: L2-3 Injection technique: single-shot Needle Needle type: Quincke  Needle gauge: 22 G Needle length: 9 cm Assessment Sensory level: T4 Events: CSF return

## 2023-08-05 NOTE — H&P (Signed)
 History of Present Illness: The patient is an 68 y.o. female seen in clinic today for evaluation of her left knee. Patient reports several years of sharp stabbing and throbbing pains over the medial anterior aspect of her knee which was initially intermittent has become more constant over time. She has been treated with physical therapy, several injections, Celebrex , and home exercises with minimal improvement over time. She reports her pain gets up to a 10 out of 10 despite these treatments. Her last injection was in the end of 2024 and she reports she got about a month of relief from the shot before the pain came back. She reports that her knee functionally limits her on a daily basis and affects her activities of daily living and her willingness to participate in activities. She is here to discuss more permanent solution for her knee. The patient denies fevers, chills, numbness, tingling, shortness of breath, chest pain, recent illness, or any trauma.  The patient is a non-smoker, well-controlled diabetic with an A1c of 7.1, and has a BMI of 41  Past Medical History: Past Medical History:  Diagnosis Date  Ankle fracture 2015  right  Arthritis  Cataract cortical, senile  Cataract of both eyes  Eczema  Essential hypertension, benign  Fibromyalgia  GERD (gastroesophageal reflux disease)  Headache, migraine  History of hemorrhoids  History of hyperthyroidism  Hyperlipidemia 05/10/2015  Oral herpes  Restless leg syndrome  Sleep apnea  Thyroid disease  Trigger finger   Past Surgical History: Past Surgical History:  Procedure Laterality Date  CATARACT EXTRACTION Bilateral 2017  open carpal tunnel release Left 12/18/2015  COLPOSCOPY 08/24/2022  CIN1  BACK SURGERY  rotator cuff Right   Past Family History: Family History  Problem Relation Age of Onset  Colon cancer Mother  Stroke Mother  Lung cancer Father  Lung cancer Maternal Aunt  Heart disease Maternal Aunt  Colon cancer  Maternal Grandfather   Medications: Current Outpatient Medications  Medication Sig Dispense Refill  alendronate (FOSAMAX) 70 MG tablet Take 1 tablet (70 mg total) by mouth every 7 (seven) days WITH A FULL GLASS OF WATER DO NOT LIE DOWN FOR THE NEXT 30 MINUTES 12 tablet 3  betamethasone valerate (VALISONE) 0.1 % ointment Apply topically 2 (two) times daily.  bisoproloL -hydroCHLOROthiazide  (ZIAC ) 2.5-6.25 mg tablet take 1 tablet by mouth daily 90 tablet 1  CALCIUM  CARBONATE (CALCIUM  600 ORAL) Take by mouth.  celecoxib  (CELEBREX ) 200 MG capsule Take 1 capsule (200 mg total) by mouth once daily for 30 days 30 capsule 1  cholecalciferol (VITAMIN D3) 2,000 unit tablet Take 2,000 Units by mouth once daily.  cyanocobalamin (VITAMIN B12) 1000 MCG tablet Take 1,000 mcg by mouth once daily.  dulaglutide (TRULICITY) 0.75 mg/0.5 mL subcutaneous pen injector Inject 0.5 mLs (0.75 mg total) subcutaneously every 7 (seven) days 2 mL 2  DULoxetine  (CYMBALTA ) 30 MG DR capsule TAKE 1 CAPSULE(30 MG) BY MOUTH DAILY 90 capsule 1  magnesium oxide 500 mg Tab Take by mouth.  metFORMIN  (GLUCOPHAGE -XR) 750 MG XR tablet TAKE 1 TABLET(750 MG) BY MOUTH TWICE DAILY 180 tablet 0  omeprazole (PRILOSEC) 20 MG DR capsule TAKE 1 CAPSULE(20 MG) BY MOUTH DAILY 90 capsule 0  POT BICARB/POTASSIUM CIT/CA (POTASSIUM BICARBONATE ORAL) Take 300 mg by mouth once a week Every 2 days  pramipexole  (MIRAPEX ) 0.5 MG tablet TAKE 1 TABLET BY MOUTH THREE TIMES DAILY 270 tablet 0  rosuvastatin  (CRESTOR ) 10 MG tablet TAKE 1 TABLET(10 MG) BY MOUTH DAILY 90 tablet 0  SUMAtriptan  (IMITREX )  100 MG tablet May take a second dose after 2 hours if needed. 10 tablet 0  traMADol  (ULTRAM ) 50 mg tablet Take 50 mg by mouth every 6 (six) hours as needed for Pain Taking Once a day  UNABLE TO FIND Diabetic Supplies: Glucometer, test strips, lancets. Check glucose BID (fasting and 2 hours after eating). One month supply. Refills PRN. 1 Units prn  valACYclovir   (VALTREX ) 1000 MG tablet TAKE 2 TABLETS BY MOUTH EVERY 12 HOURS FOR 1 DAY AS NEEDED FOR FLARES UP 30 tablet 0  zolpidem  (AMBIEN ) 5 MG tablet Take 5 mg by mouth at bedtime as needed Every night   No current facility-administered medications for this visit.   Allergies: Allergies  Allergen Reactions  Codeine Unknown and Other (See Comments)  Codeine Sulfate Nausea  Propoxyphene N-Acetaminophen  Unknown    Visit Vitals: Vitals:  07/16/23 1033  BP: 124/82    Review of Systems:  A comprehensive 14 point ROS was performed, reviewed, and the pertinent orthopaedic findings are documented in the HPI.  Physical Exam: General/Constitutional: No apparent distress: well-nourished and well developed. Eyes: Pupils equal, round with synchronous movement. Pulmonary exam: Lungs clear to auscultation bilaterally no wheezing rales or rhonchi Cardiac exam: Regular rate and rhythm no obvious murmurs rubs or gallops. Integumentary: No impressive skin lesions present, except as noted in detailed exam. Neuro/Psych: Normal mood and affect, oriented to person, place and time.  Comprehensive Knee Exam: Gait Non-antalgic and fluid  Alignment Neutral   Inspection Right Left  Skin Normal appearance with no obvious deformity. No ecchymosis or erythema. Normal appearance with no obvious deformity. No ecchymosis or erythema.  Soft Tissue No focal soft tissue swelling No focal soft tissue swelling  Quad Atrophy None None   Palpation  Right Left  Tenderness Mild medial joint line tenderness Medial joint line parapatellar tenderness to palpation  Crepitus No patellofemoral or tibiofemoral crepitus + patellofemoral and tibiofemoral crepitus  Effusion None None   Range of Motion Right Left  Flexion 0-125 0-115  Extension Full knee extension without hyperextension Full knee extension without hyperextension   Ligamentous Exam Right Left  Lachman Normal Normal  Valgus 0 Normal Normal  Valgus 30 Normal  Normal  Varus 0 Normal Normal  Varus 30 Normal Normal  Anterior Drawer Normal Normal  Posterior Drawer Normal Normal   Meniscal Exam Right Left  Hyperflexion Test Negative Positive  Hyperextension Test Negative Positive  McMurray's Negative Positive    Neurovascular Right Left  Quadriceps Strength 5/5 5/5  Hamstring Strength 5/5 5/5  Hip Abductor Strength 4/5 4/5  Distal Motor Normal Normal  Distal Sensory Normal light touch sensation Normal light touch sensation  Distal Pulses Normal Normal    Imaging Studies: I have reviewed AP, lateral,sunrise, and flexed PA weight bearing knee X-rays 4 view x-rays of the left knee performed on 06/22/2023 images reviewed by myself. There is tricompartmental degenerative changes with medial and patellofemoral bone-on-bone articulation, osteophyte formation, sclerosis chondral cyst formation. Kellgren-Lawrence grade 4. AP, flexed PA and sunrise of the right knee show medial patellofemoral joint space narrowing with sclerosis and osteophyte formation Kellgren-Lawrence grade 3. No fractures or dislocations noted in either knee.  Assessment:  Left knee osteoarthritis  Plan: Dalana is a 68 year old female with bone-on-bone left knee osteoarthritis. Based upon the patient's continued symptoms and failure to respond to conservative treatment, I have recommended a left total knee replacement for this patient. A long discussion took place with the patient describing what a total joint replacement is  and what the procedure would entail. A knee model, similar to the implants that will be used during the operation, was utilized to demonstrate the implants. Choices of implant manufactures were discussed and reviewed. The ability to secure the implant utilizing cement or cementless (press fit) fixation was discussed. The approach and exposure was discussed.   The hospitalization and post-operative care and rehabilitation were also discussed. The use of  perioperative antibiotics and DVT prophylaxis were discussed. The risk, benefits and alternatives to a surgical intervention were discussed at length with the patient. The patient was also advised of risks related to the medical comorbidities and elevated body mass index (BMI). A lengthy discussion took place to review the most common complications including but not limited to: stiffness, loss of function, complex regional pain syndrome, deep vein thrombosis, pulmonary embolus, heart attack, stroke, infection, wound breakdown, numbness, intraoperative fracture, damage to nerves, tendon,muscles, arteries or other blood vessels, death and other possible complications from anesthesia. The patient was told that we will take steps to minimize these risks by using sterile technique, antibiotics and DVT prophylaxis when appropriate and follow the patient postoperatively in the office setting to monitor progress. The possibility of recurrent pain, no improvement in pain and actual worsening of pain were also discussed with the patient.   Patient asked about and confirms no history of any reactions to metal or metal allergy in the past.  The discharge plan of care focused on the patient going home following surgery. The patient was encouraged to make the necessary arrangements to have someone stay with them when they are discharged home.   The benefits of surgery were discussed with the patient including the potential for improving the patient's current clinical condition through operative intervention. Alternatives to surgical intervention including continued conservative management were also discussed in detail. All questions were answered to the satisfaction of the patient. The patient participated and agreed to the plan of care as well as the use of the recommended implants for their total knee replacement surgery. An information packet was given to the patient to review prior to surgery.   Patient received  clearance for surgery. All questions answered patient agrees above plan appropriations for left total knee replacement.  Portions of this record have been created using Scientist, clinical (histocompatibility and immunogenetics). Dictation errors have been sought, but may not have been identified and corrected.  Venus Ginsberg MD

## 2023-08-06 ENCOUNTER — Encounter: Payer: Self-pay | Admitting: Orthopedic Surgery

## 2023-08-06 DIAGNOSIS — M1712 Unilateral primary osteoarthritis, left knee: Secondary | ICD-10-CM | POA: Diagnosis not present

## 2023-08-06 LAB — CBC
HCT: 38.9 % (ref 36.0–46.0)
Hemoglobin: 13.2 g/dL (ref 12.0–15.0)
MCH: 30 pg (ref 26.0–34.0)
MCHC: 33.9 g/dL (ref 30.0–36.0)
MCV: 88.4 fL (ref 80.0–100.0)
Platelets: 186 10*3/uL (ref 150–400)
RBC: 4.4 MIL/uL (ref 3.87–5.11)
RDW: 13.8 % (ref 11.5–15.5)
WBC: 15.8 10*3/uL — ABNORMAL HIGH (ref 4.0–10.5)
nRBC: 0 % (ref 0.0–0.2)

## 2023-08-06 LAB — BASIC METABOLIC PANEL WITH GFR
Anion gap: 9 (ref 5–15)
BUN: 17 mg/dL (ref 8–23)
CO2: 22 mmol/L (ref 22–32)
Calcium: 9 mg/dL (ref 8.9–10.3)
Chloride: 106 mmol/L (ref 98–111)
Creatinine, Ser: 0.8 mg/dL (ref 0.44–1.00)
GFR, Estimated: >60 mL/min (ref >60)
Glucose, Bld: 223 mg/dL — ABNORMAL HIGH (ref 70–99)
Potassium: 4.2 mmol/L (ref 3.5–5.1)
Sodium: 137 mmol/L (ref 135–145)

## 2023-08-06 LAB — GLUCOSE, CAPILLARY: Glucose-Capillary: 199 mg/dL — ABNORMAL HIGH (ref 70–99)

## 2023-08-06 MED ORDER — ENOXAPARIN SODIUM 30 MG/0.3ML IJ SOSY: 40.0000 mg | PREFILLED_SYRINGE | INTRAMUSCULAR | 0 refills | Status: DC

## 2023-08-06 MED ORDER — ENOXAPARIN SODIUM 30 MG/0.3ML IJ SOSY: 30.0000 mg | PREFILLED_SYRINGE | Freq: Two times a day (BID) | INTRAMUSCULAR | 0 refills | Status: DC

## 2023-08-06 MED ORDER — HYDROCODONE-ACETAMINOPHEN 5-325 MG PO TABS: 1.0000 | ORAL_TABLET | Freq: Four times a day (QID) | ORAL | 0 refills | Status: AC | PRN

## 2023-08-06 MED ORDER — TRAMADOL HCL 50 MG PO TABS
50.0000 mg | ORAL_TABLET | Freq: Four times a day (QID) | ORAL | 0 refills | Status: DC | PRN
Start: 2023-08-06 — End: 2023-09-02

## 2023-08-06 NOTE — Progress Notes (Signed)
 Pt d/c home in care of her husband.  NT escorted pt to car in wheel chair.  Informed pt of homecare instructions and gave AVS, prescriptions, polar care and all personal items.  Pt's questions and concerns addressed and answered.  Safety maintained.

## 2023-08-06 NOTE — Progress Notes (Signed)
 Physical Therapy Treatment Patient Details Name: Carolyn Graham MRN: [id number] DOB: 04-04-1955 Today's Date: 08/06/2023   History of Present Illness 68 yo female s/p L TKA. PMH arthritis, DM2, fatty liver, fibromyalgia, PLIF L4-L5, GERD, HTN, schatzki rings, stress incontinence, TIA, OSA uses CPAP, hyperthyroidism, vertigo, R shoulder surg, lumbar surgy 2018.    PT Comments  Pt was long sitting in bed upon arrival. She is A and O x 4. Easily and safely and safely able to exit bed, stand, and tolerate ambulation to BR then 200 more ft with RW. Safely perform ascending/descending stairs to simulate home entry.  Pt is progressing well. Cleared for safe DC home from an acute PT standpoint.    If plan is discharge home, recommend the following: A little help with walking and/or transfers;A little help with bathing/dressing/bathroom;Assist for transportation;Direct supervision/assist for medications management;Help with stairs or ramp for entrance     Equipment Recommendations  Rolling walker (2 wheels)       Precautions / Restrictions Precautions Precautions: Fall Recall of Precautions/Restrictions: Intact Restrictions Weight Bearing Restrictions Per Provider Order: Yes LLE Weight Bearing Per Provider Order: Weight bearing as tolerated     Mobility  Bed Mobility Overal bed mobility: Modified Independent  Transfers Overall transfer level: Modified independent Equipment used: Rolling walker (2 wheels) Transfers: Sit to/from Stand Sit to Stand: Modified independent (Device/Increase time)   Ambulation/Gait Ambulation/Gait assistance: Supervision Gait Distance (Feet): 200 Feet Assistive device: Rolling walker (2 wheels) Gait Pattern/deviations: Step-to pattern, Step-through pattern, Antalgic Gait velocity: decreased  General Gait Details: antalgic gait   Stairs Stairs: Yes Stairs assistance: Supervision Stair Management: No rails, One rail Left, Step to pattern, Sideways,  Backwards Number of Stairs: 4 General stair comments: Pt easily and safely able to ascend/descend stairs to simulate home entry    Balance Overall balance assessment: Needs assistance Sitting-balance support: Feet supported Sitting balance-Leahy Scale: Good     Standing balance support: During functional activity, Reliant on assistive device for balance Standing balance-Leahy Scale: Good     Communication Communication Communication: No apparent difficulties  Cognition Arousal: Alert Behavior During Therapy: WFL for tasks assessed/performed   PT - Cognitive impairments: No apparent impairments      PT - Cognition Comments: Pt is A and O x 4 Following commands: Intact      Cueing Cueing Techniques: Verbal cues     General Comments General comments (skin integrity, edema, etc.): Pt performed ROM exercises in sitting. Demonstrated ~ 92 degrees flexion while lacking ~ 2 degrees of full terminal knee extension. Reviewed car transfers, HEP, polar care use, and importance of routine mobility      Pertinent Vitals/Pain Pain Assessment Pain Assessment: 0-10 Pain Score: 4  Pain Location: knee/back Pain Descriptors / Indicators: Aching Pain Intervention(s): Limited activity within patient's tolerance, Monitored during session, Premedicated before session, Repositioned     PT Goals (current goals can now be found in the care plan section) Acute Rehab PT Goals Patient Stated Goal: to go home Progress towards PT goals: Progressing toward goals    Frequency    BID       AM-PAC PT 6 Clicks Mobility   Outcome Measure  Help needed turning from your back to your side while in a flat bed without using bedrails?: A Little Help needed moving from lying on your back to sitting on the side of a flat bed without using bedrails?: A Little Help needed moving to and from a bed to a chair (including a wheelchair)?:  A Little Help needed standing up from a chair using your arms (e.g.,  wheelchair or bedside chair)?: A Little Help needed to walk in hospital room?: A Little Help needed climbing 3-5 steps with a railing? : A Little 6 Click Score: 18    End of Session   Activity Tolerance: Patient tolerated treatment well Patient left: in chair;with call bell/phone within reach Nurse Communication: Mobility status PT Visit Diagnosis: Other abnormalities of gait and mobility (R26.89);Difficulty in walking, not elsewhere classified (R26.2);Muscle weakness (generalized) (M62.81);Pain Pain - Right/Left: Left Pain - part of body: Knee     Time: 0740-0759 PT Time Calculation (min) (ACUTE ONLY): 19 min  Charges:    $Gait Training: 8-22 mins PT General Charges $$ ACUTE PT VISIT: 1 Visit                     Chester Costa PTA 08/06/23, 8:46 AM

## 2023-08-06 NOTE — Discharge Summary (Addendum)
 Physician Discharge Summary  Subjective: 1 Day Post-Op Procedure(s) (LRB): ARTHROPLASTY, KNEE, TOTAL (Left) Patient reports pain as mild.   Patient seen in rounds with Dr. Clyda Dark. Patient is well, and has had no acute complaints or problems Patient is ready to go home after physical therapy.  She will go home with home health physical therapy.  Physician Discharge Summary  Patient ID: Carolyn Graham MRN: [id number] DOB/AGE: [birth year] 66 y.o.  Admit date: 08/05/2023 Discharge date: 08/06/2023  Admission Diagnoses:  Discharge Diagnoses:  Principal Problem:   S/P TKR (total knee replacement), left   Discharged Condition: good  Hospital Course: The patient is postop day 1 from a left total knee arthroplasty.  She is doing very well since surgery.  Her vitals are stable.  Her pain is manageable.  She is ambulated to the bathroom and has already done physical therapy.  Her strength is improving.  The patient will be discharged home after physical therapy today.  She will be discharged home with home health physical therapy.  Treatments: surgery:   Left Total Knee Arthroplasty   Components/Implants: Femur: Persona Size 6 Narrow Nitrided Titanium    Tibia: Persona Size D w/ 14x26m stem extension  Poly: 12mm MC  Patella: 29x8 symmetric    Femoral Valgus Cut Angle: 5 degrees   Distal Femoral Re-cut: none   Patella Resurfacing: yes    Date of Surgery: 08/05/2023   Surgeon: Venus Ginsberg MD   Assistant: [facility] RNFA (present and scrubbed throughout the case, critical for assistance with exposure, retraction, instrumentation, and closure)     Anesthesiologist: Mazzoni   Anesthesia: Spinal    Tourniquet Time: 69 min   EBL: 50cc   IVF: 500cc   Complications: None  Discharge Exam: Blood pressure (!) 152/89, pulse 66, temperature (!) 97.1 F (36.2 C), temperature source Temporal, resp. rate 14, height 5' 0.25 (1.53 m), weight 100 kg, SpO2 99%.   Disposition: Discharge  disposition: 01-Home or Self Care        Allergies as of 08/06/2023       Reactions   Codeine Nausea And Vomiting   Darvocet [propoxyphene N-acetaminophen ] Nausea Only        Medication List     TAKE these medications    APPLE CIDER VINEGAR PO Take 1 capsule by mouth daily.   b complex vitamins capsule Take 1 capsule by mouth daily.   bisoprolol -hydrochlorothiazide  2.5-6.25 MG tablet Commonly known as: ZIAC  Take 1 tablet by mouth daily.   CALCIUM  PO Take 640 mg by mouth daily.   celecoxib  200 MG capsule Commonly known as: CELEBREX  Take 200 mg by mouth daily.   diclofenac  Sodium 1 % Gel Commonly known as: VOLTAREN  Apply 2-4 grams to affected joint 4 times daily as needed.   DULoxetine  30 MG capsule Commonly known as: CYMBALTA  Take 30 mg by mouth at bedtime. Takes at night   enoxaparin  30 MG/0.3ML injection Commonly known as: LOVENOX  Inject 0.4 mLs (40 mg total) into the skin daily for 14 days.   ferrous sulfate  325 (65 FE) MG EC tablet Take 325 mg by mouth daily.   HYDROcodone -acetaminophen  5-325 MG tablet Commonly known as: NORCO/VICODIN Take 1-2 tablets by mouth every 6 (six) hours as needed for severe pain (pain score 7-10).   hydrocortisone cream 1 % Apply 1 Application topically daily as needed for itching.   metFORMIN  750 MG 24 hr tablet Commonly known as: GLUCOPHAGE -XR Take 750 mg by mouth in the morning and at bedtime.  omeprazole 20 MG capsule Commonly known as: PRILOSEC Take 20 mg by mouth daily   Potassium Gluconate 550 MG Tabs Take 550 mg by mouth every other day.   pramipexole  0.5 MG tablet Commonly known as: MIRAPEX  Take 1.5 mg by mouth at bedtime.   rosuvastatin  10 MG tablet Commonly known as: CRESTOR  Take 10 mg by mouth daily.   SUMAtriptan  100 MG tablet Commonly known as: IMITREX  Take 100 mg by mouth every 2 (two) hours as needed for migraine or headache.   traMADol  50 MG tablet Commonly known as: ULTRAM  TAKE 1  TABLET(50 MG) BY MOUTH AT BEDTIME AS NEEDED Per Dr. Jory Ng lab note 07/20/2023 Please review the refill history of tramadol . We may not need to fill it very often if patient is taking it only 3-4 times a week. What changed: Another medication with the same name was added. Make sure you understand how and when to take each.   traMADol  50 MG tablet Commonly known as: ULTRAM  Take 1 tablet (50 mg total) by mouth every 6 (six) hours as needed for moderate pain (pain score 4-6). What changed: You were already taking a medication with the same name, and this prescription was added. Make sure you understand how and when to take each.   Trulicity 0.75 MG/0.5ML Soaj Generic drug: Dulaglutide Inject 0.75 mg into the skin every Thursday.   valACYclovir  1000 MG tablet Commonly known as: VALTREX  Take 1,000-2,000 mg by mouth See admin instructions.  Take 2000 mg at first sign of fever blisters then take 1000 mg daily as needed   Vitamin D 50 MCG (2000 UT) Caps Take 2,000 Units daily by mouth.   zolpidem  5 MG tablet Commonly known as: AMBIEN  TAKE 1 TABLET BY MOUTH AT BEDTIME AS NEEDED FOR SLEEP        Follow-up Information     [facility]  Follow up.   Contact information: [address] [place] [address] [phone]         Coralyn Derry, PA-C Follow up in 2 week(s).   Specialties: Orthopedic Surgery, Emergency Medicine Why: For wound care Contact information: [address] [facility] [place] [address] [phone]                 Signed: Sharyon Deis, TODD 08/06/2023, 6:43 AM   Objective: Vital signs in last 24 hours: Temp:  [97.1 F (36.2 C)-98.6 F (37 C)] 97.1 F (36.2 C) (06/13 0426) Pulse Rate:  [66-87] 66 (06/13 0426) Resp:  [12-19] 14 (06/13 0426) BP: (104-152)/(61-90) 152/89 (06/13 0426) SpO2:  [91 %-99 %] 99 % (06/13 0426) Weight:  [100 kg] 100 kg (06/12 1055)  Intake/Output  from previous day:  Intake/Output Summary (Last 24 hours) at 08/06/2023 0643 Last data filed at 08/05/2023 1502 Gross per 24 hour  Intake 1501.82 ml  Output 750 ml  Net 751.82 ml    Intake/Output this shift: No intake/output data recorded.  Labs: No results for input(s): HGB in the last 72 hours. No results for input(s): WBC, RBC, HCT, PLT in the last 72 hours. No results for input(s): NA, K, CL, CO2, BUN, CREATININE, GLUCOSE, CALCIUM  in the last 72 hours. No results for input(s): LABPT, INR in the last 72 hours.  EXAM: General - Patient is Alert and Oriented Extremity - Neurovascular intact Sensation intact distally Dorsiflexion/Plantar flexion intact Incision - clean, dry, no drainage Motor Function -plantarflexion and dorsiflexion intact.  Ambulating to the bathroom.  Assessment/Plan: 1 Day Post-Op Procedure(s) (LRB): ARTHROPLASTY, KNEE, TOTAL (Left) Procedure(s) (LRB): ARTHROPLASTY, KNEE, TOTAL (Left) Past Medical History:  Diagnosis Date   Allergy    Anemia    Arthritis    Broken ankle 11/2013   right ankle   Cataract    Bilateral - surgery to remove   Depression    Diabetes mellitus without complication (HCC)    type 2   Fatty liver    Fever blister    Fibromyalgia 2005   GERD (gastroesophageal reflux disease)    uses prilosec   Headache(784.0)    not as often as before   Hypercholesteremia    Hypertension    Hyperthyroidism    in remission per patient, no meds   OSA (obstructive sleep apnea)    uses CPAP nightly   Plantar fasciitis    no current problems per patient as of 10/08/22   Pneumonia    age 23 -7   Post-operative nausea and vomiting    Primary osteoarthritis of left knee    Restless leg syndrome    Schatzki's ring    Spondylolisthesis of lumbar region    Stress incontinence    Stroke (HCC) 2015   TIA.  unable to determine if this is true.   Thyroiditis    Vertigo    Principal Problem:   S/P TKR (total  knee replacement), left  Estimated body mass index is 42.7 kg/m as calculated from the following:   Height as of this encounter: 5' 0.25 (1.53 m).   Weight as of this encounter: 100 kg. Advance diet Up with therapy D/C IV fluids Discharge home with home health Diet - Regular diet Follow up - in 2 weeks Activity - WBAT Disposition - Home Condition Upon Discharge - Stable DVT Prophylaxis - Lovenox  and TED hose  Bert Britain, PA-C Orthopaedic Surgery 08/06/2023, 6:43 AM

## 2023-08-06 NOTE — Progress Notes (Signed)
  Subjective: 1 Day Post-Op Procedure(s) (LRB): ARTHROPLASTY, KNEE, TOTAL (Left) Patient reports pain as mild.   Patient is well, and has had no acute complaints or problems Plan is to go Home after hospital stay. Negative for chest pain and shortness of breath Fever: no Gastrointestinal: Negative for nausea and vomiting  Objective: Vital signs in last 24 hours: Temp:  [97.1 F (36.2 C)-98.6 F (37 C)] 97.1 F (36.2 C) (06/13 0426) Pulse Rate:  [66-87] 66 (06/13 0426) Resp:  [12-19] 14 (06/13 0426) BP: (104-152)/(61-90) 152/89 (06/13 0426) SpO2:  [91 %-99 %] 99 % (06/13 0426) Weight:  [100 kg] 100 kg (06/12 1055)  Intake/Output from previous day:  Intake/Output Summary (Last 24 hours) at 08/06/2023 0635 Last data filed at 08/05/2023 1502 Gross per 24 hour  Intake 1501.82 ml  Output 750 ml  Net 751.82 ml    Intake/Output this shift: No intake/output data recorded.  Labs: No results for input(s): HGB in the last 72 hours. No results for input(s): WBC, RBC, HCT, PLT in the last 72 hours. No results for input(s): NA, K, CL, CO2, BUN, CREATININE, GLUCOSE, CALCIUM  in the last 72 hours. No results for input(s): LABPT, INR in the last 72 hours.   EXAM General - Patient is Alert and Oriented Extremity - Neurovascular intact Sensation intact distally Dorsiflexion/Plantar flexion intact Compartment soft Dressing/Incision - clean, dry, no drainage Motor Function - intact, moving foot and toes well on exam.  Ambulated 50 feet with physical therapy  Past Medical History:  Diagnosis Date   Allergy    Anemia    Arthritis    Broken ankle 11/2013   right ankle   Cataract    Bilateral - surgery to remove   Depression    Diabetes mellitus without complication (HCC)    type 2   Fatty liver    Fever blister    Fibromyalgia 2005   GERD (gastroesophageal reflux disease)    uses prilosec   Headache(784.0)    not as often as before    Hypercholesteremia    Hypertension    Hyperthyroidism    in remission per patient, no meds   OSA (obstructive sleep apnea)    uses CPAP nightly   Plantar fasciitis    no current problems per patient as of 10/08/22   Pneumonia    age 47 -7   Post-operative nausea and vomiting    Primary osteoarthritis of left knee    Restless leg syndrome    Schatzki's ring    Spondylolisthesis of lumbar region    Stress incontinence    Stroke (HCC) 2015   TIA.  unable to determine if this is true.   Thyroiditis    Vertigo     Assessment/Plan: 1 Day Post-Op Procedure(s) (LRB): ARTHROPLASTY, KNEE, TOTAL (Left) Principal Problem:   S/P TKR (total knee replacement), left  Estimated body mass index is 42.7 kg/m as calculated from the following:   Height as of this encounter: 5' 0.25 (1.53 m).   Weight as of this encounter: 100 kg. Advance diet Up with therapy D/C IV fluids Discharge home with home health  DVT Prophylaxis - Lovenox  and TED hose Weight-Bearing as tolerated to left leg  Bert Britain, PA-C Orthopaedic Surgery 08/06/2023, 6:35 AM

## 2023-08-29 NOTE — Progress Notes (Signed)
 [facility] [address] [place], [place] [address]  Pulmonary Sleep Medicine   Office Visit Note  Patient Name: Carolyn Graham DOB: Feb 13, 1956 MRN [id number]    Chief Complaint: Obstructive Sleep Apnea visit  Brief History:  Carolyn Graham is seen today for an annual follow up visit for APAP@ 12-20 cmH2O. The patient has a 11 year history of sleep apnea. Patient is using PAP nightly.  The patient feels rested after sleeping with PAP.  The patient reports benefiting from PAP use. Reported sleepiness is  improved and the Epworth Sleepiness Score is 14 out of 24. The patient does take naps. The patient complains of the following: some headaches and occasional dryness. Discussed frequent changes of supplies.  The compliance download shows 71% compliance with an average use time of 5 hours 32 minutes. The AHI is 2.5.  The patient does complain of limb movements disrupting sleep. The patient continues to require PAP therapy in order to eliminate sleep apnea.  ROS  General: (-) fever, (-) chills, (-) night sweat Nose and Sinuses: (-) nasal stuffiness or itchiness, (-) postnasal drip, (-) nosebleeds, (-) sinus trouble. Mouth and Throat: (-) sore throat, (-) hoarseness. Neck: (-) swollen glands, (-) enlarged thyroid, (-) neck pain. Respiratory: + cough, - shortness of breath, - wheezing. Neurologic: - numbness, - tingling. Psychiatric: - anxiety, - depression   Current Medication: Outpatient Encounter Medications as of 08/30/2023  Medication Sig   aspirin EC 81 MG tablet Take 81 mg by mouth.   [DISCONTINUED] oxyCODONE  (OXY IR/ROXICODONE ) 5 MG immediate release tablet Take 2.5-5 mg by mouth.   APPLE CIDER VINEGAR PO Take 1 capsule by mouth daily.   b complex vitamins capsule Take 1 capsule by mouth daily.   bisoprolol -hydrochlorothiazide  (ZIAC ) 2.5-6.25 MG tablet Take 1 tablet by mouth daily.   CALCIUM  PO Take 640 mg by mouth daily.   celecoxib  (CELEBREX ) 200 MG capsule Take 200 mg  by mouth daily.   Cholecalciferol (VITAMIN D) 2000 units CAPS Take 2,000 Units daily by mouth.    diclofenac  Sodium (VOLTAREN ) 1 % GEL Apply 2-4 grams to affected joint 4 times daily as needed.   Dulaglutide (TRULICITY) 0.75 MG/0.5ML SOPN Inject 0.75 mg into the skin every Thursday.   DULoxetine  (CYMBALTA ) 30 MG capsule Take 30 mg by mouth at bedtime. Takes at night   ferrous sulfate  325 (65 FE) MG EC tablet Take 325 mg by mouth daily.   HYDROcodone -acetaminophen  (NORCO/VICODIN) 5-325 MG tablet Take 1-2 tablets by mouth every 6 (six) hours as needed for severe pain (pain score 7-10).   hydrocortisone cream 1 % Apply 1 Application topically daily as needed for itching.   metFORMIN  (GLUCOPHAGE -XR) 750 MG 24 hr tablet Take 750 mg by mouth in the morning and at bedtime.   omeprazole (PRILOSEC) 20 MG capsule Take 20 mg by mouth daily   Potassium Gluconate 550 MG TABS Take 550 mg by mouth every other day.   pramipexole  (MIRAPEX ) 0.5 MG tablet Take 1.5 mg by mouth at bedtime.   rosuvastatin  (CRESTOR ) 10 MG tablet Take 10 mg by mouth daily.   SUMAtriptan  (IMITREX ) 100 MG tablet Take 100 mg by mouth every 2 (two) hours as needed for migraine or headache.   traMADol  (ULTRAM ) 50 MG tablet Take 1 tablet (50 mg total) by mouth every 6 (six) hours as needed for moderate pain (pain score 4-6).   valACYclovir  (VALTREX ) 1000 MG tablet Take 1,000-2,000 mg by mouth See admin instructions.  Take 2000 mg at first  sign of fever blisters then take 1000 mg daily as needed   zolpidem  (AMBIEN ) 5 MG tablet TAKE 1 TABLET BY MOUTH AT BEDTIME AS NEEDED FOR SLEEP   [DISCONTINUED] enoxaparin  (LOVENOX ) 30 MG/0.3ML injection Inject 0.4 mLs (40 mg total) into the skin daily for 14 days.   [DISCONTINUED] traMADol  (ULTRAM ) 50 MG tablet TAKE 1 TABLET(50 MG) BY MOUTH AT BEDTIME AS NEEDED Per Dr. Jammie lab note 07/20/2023 Please review the refill history of tramadol . We may not need to fill it very often if patient is taking it  only 3-4 times a week.   Facility-Administered Encounter Medications as of 08/30/2023  Medication   triamcinolone  acetonide (KENALOG ) 10 MG/ML injection 10 mg    Surgical History: Past Surgical History:  Procedure Laterality Date   CARPAL TUNNEL RELEASE Left 12/18/2015   Procedure: CARPAL TUNNEL RELEASE;  Surgeon: Helayne Glenn, MD;  Location: [facility] OR[facility];  Service: Orthopedics;  Laterality: Left;   CARPAL TUNNEL RELEASE Right 2018   CATARACT EXTRACTION W/PHACO Right 11/13/2015   Procedure: CATARACT EXTRACTION PHACO AND INTRAOCULAR LENS PLACEMENT (IOC);  Surgeon: Steven Dingeldein, MD;  Location: [facility] OR[facility];  Service: Ophthalmology;  Laterality: Right;  US  01:08AP% 21.6CDE 26.99Fluid pack lot # [id number]   CATARACT EXTRACTION W/PHACO Left 12/04/2015   Procedure: CATARACT EXTRACTION PHACO AND INTRAOCULAR LENS PLACEMENT (IOC);  Surgeon: Steven Dingeldein, MD;  Location: [facility];  Service: Ophthalmology;  Laterality: Left;  US  59.2 AP% 24.4 CDE 29.2 Fluid Pack lot # [id number]   CERVICAL DISC SURGERY     cervical decompression c4-5 and c5-6   COLONOSCOPY W/ BIOPSIES AND POLYPECTOMY     CRT left finger     CTR     right hand   EYE SURGERY Bilateral    cataract    KYPHOPLASTY  02/12/2023   LUMBAR FUSION  10/22/2022   L4-L5   LUMBAR SPINE SURGERY  2018   SHOULDER SURGERY     right shoulder   TOTAL KNEE ARTHROPLASTY Left 08/05/2023   Procedure: ARTHROPLASTY, KNEE, TOTAL;  Surgeon: Lorelle Hussar, MD;  Location: [facility] OR[facility];  Service: Orthopedics;  Laterality: Left;   TRIGGER FINGER RELEASE     right finger   VAGINA SURGERY  04/2022   vaginal tear repair   WISDOM TOOTH EXTRACTION     WISDOM TOOTH EXTRACTION      Medical History: Past Medical History:  Diagnosis Date   Allergy    Anemia    Arthritis    Broken ankle 11/2013   right ankle   Cataract    Bilateral - surgery to remove   Depression    Diabetes mellitus without complication (HCC)    type 2   Fatty liver    Fever  blister    Fibromyalgia 2005   GERD (gastroesophageal reflux disease)    uses prilosec   Headache(784.0)    not as often as before   Hypercholesteremia    Hypertension    Hyperthyroidism    in remission per patient, no meds   OSA (obstructive sleep apnea)    uses CPAP nightly   Plantar fasciitis    no current problems per patient as of 10/08/22   Pneumonia    age 37 -7   Post-operative nausea and vomiting    Primary osteoarthritis of left knee    Restless leg syndrome    Schatzki's ring    Spondylolisthesis of lumbar region    Stress incontinence    Stroke (HCC) 2015   TIA.  unable to  determine if this is true.   Thyroiditis    Vertigo     Family History: Non contributory to the present illness  Social History: Social History   Socioeconomic History   Marital status: Married    Spouse name: Grieco,Michael (Spouse)   Number of children: 0   Years of education: Not on file   Highest education level: Not on file  Occupational History   Occupation: Engineer, civil (consulting): [employer]  Tobacco Use   Smoking status: Never    Passive exposure: Past   Smokeless tobacco: Never  Vaping Use   Vaping status: Never Used  Substance and Sexual Activity   Alcohol use: Yes    Comment: OCCAS   Drug use: No   Sexual activity: Not Currently    Partners: Female  Other Topics Concern   Not on file  Social History Narrative   Married to [place], no natural children   Right handed   Bachelor's degree   2-3 cups daily         Social Drivers of Health   Financial Resource Strain: Low Risk  (07/29/2023)   Received from [facility]   Overall Financial Resource Strain (CARDIA)    Difficulty of Paying Living Expenses: Not hard at all  Food Insecurity: No Food Insecurity (08/05/2023)   Hunger Vital Sign    Worried About Running Out of Food in the Last Year: Never true    Ran Out of Food in the Last Year: Never true  Transportation Needs: No  Transportation Needs (08/05/2023)   PRAPARE - Administrator, Civil Service (Medical): No    Lack of Transportation (Non-Medical): No  Physical Activity: Not on file  Stress: Not on file  Social Connections: Moderately Integrated (08/05/2023)   Social Connection and Isolation Panel    Frequency of Communication with Friends and Family: Once a week    Frequency of Social Gatherings with Friends and Family: Once a week    Attends Religious Services: More than 4 times per year    Active Member of [employer] or Organizations: Yes    Attends Banker Meetings: More than 4 times per year    Marital Status: Married  Catering manager Violence: Not At Risk (08/05/2023)   Humiliation, Afraid, Rape, and Kick questionnaire    Fear of Current or Ex-Partner: No    Emotionally Abused: No    Physically Abused: No    Sexually Abused: No    Vital Signs: Blood pressure (!) 140/86, pulse 77, resp. rate 16, height 5' 0.25 (1.53 m), weight 215 lb (97.5 kg), SpO2 95%. Body mass index is 41.64 kg/m.    Examination: General Appearance: The patient is well-developed, well-nourished, and in no distress. Neck Circumference: 45 cm Skin: Gross inspection of skin unremarkable. Head: normocephalic, no gross deformities. Eyes: no gross deformities noted. ENT: ears appear grossly normal Neurologic: Alert and oriented. No involuntary movements.  STOP BANG RISK ASSESSMENT S (snore) Have you been told that you snore?     NO   T (tired) Are you often tired, fatigued, or sleepy during the day?   YES  O (obstruction) Do you stop breathing, choke, or gasp during sleep? NO   P (pressure) Do you have or are you being treated for high blood pressure? YES   B (BMI) Is your body index greater than 35 kg/m? YES   A (age) Are you 51 years old or older? YES  N (neck) Do you have a neck circumference greater than 16 inches?   YES   G (gender) Are you a female? NO   TOTAL STOP/BANG "YES" ANSWERS 5        A STOP-Bang score of 2 or less is considered low risk, and a score of 5 or more is high risk for having either moderate or severe OSA. For people who score 3 or 4, doctors may need to perform further assessment to determine how likely they are to have OSA.         EPWORTH SLEEPINESS SCALE:  Scale:  (0)= no chance of dozing; (1)= slight chance of dozing; (2)= moderate chance of dozing; (3)= high chance of dozing  Chance  Situtation    Sitting and reading: 3    Watching TV: 3    Sitting Inactive in public: 1    As a passenger in car: 2      Lying down to rest: 3    Sitting and talking: 0    Sitting quielty after lunch: 2    In a car, stopped in traffic: 0   TOTAL SCORE:   14 out of 24    SLEEP STUDIES:  SPLIT (08/03/13) AHI 42.8, min SPO2 88%    CPAP COMPLIANCE DATA:  Date Range: 08/26/2022-08/25/2023  Average Daily Use: 5 hours 32 minutes  Median Use: 5 hours 32 minutes  Compliance for > 4 Hours: 71%  AHI: 2.5 respiratory events per hour  Days Used: 337/365 days  Mask Leak: 2.4  95th Percentile Pressure: 19.4         LABS: Recent Results (from the past 2160 hours)  DRUG MONITOR, TRAMADOL ,QN, URINE     Status: None   Collection Time: 07/15/23  1:08 PM  Result Value Ref Range   Desmethyltramadol NEGATIVE <100 ng/mL   Tramadol  NEGATIVE <100 ng/mL   Tramadol  Comments      Comment: See LDT Notes  DRUG MONITOR, PANEL 5, W/CONF, URINE     Status: None   Collection Time: 07/15/23  1:08 PM  Result Value Ref Range   Amphetamines NEGATIVE <500 ng/mL   Barbiturates NEGATIVE <300 ng/mL   Benzodiazepines NEGATIVE <100 ng/mL   Cocaine Metabolite NEGATIVE <150 ng/mL   Marijuana Metabolite NEGATIVE <20 ng/mL   Methadone Metabolite NEGATIVE <100 ng/mL   Opiates NEGATIVE <100 ng/mL   Oxycodone  NEGATIVE <100 ng/mL   Creatinine 92.0 > or = 20.0 mg/dL   pH 6.0 4.5 - 9.0   Oxidant NEGATIVE <200 mcg/mL  DM TEMPLATE     Status: None   Collection Time:  07/15/23  1:08 PM  Result Value Ref Range   Notes and Comments      Comment: This drug testing is for medical treatment only. Analysis was performed as non-forensic testing and these results should be used only by healthcare providers to render diagnosis or treatment, or to monitor progress of medical conditions. SABRA LDT Notes: Confirmation tests were developed and their analytical  performance characteristics have been determined by  [employer]. It has not been cleared or approved  by the FDA. This assay has been validated pursuant to  the CLIA regulations and is used for clinical purposes. . . Healthcare Providers needing Interpretation assistance,  please contact us  at [phone] ([phone])  M-F, 8am to 10pm EST   Surgical pcr screen     Status: None   Collection Time: 07/29/23  1:57 PM   Specimen: Nasal Mucosa; Nasal Swab  Result Value Ref  Range   MRSA, PCR NEGATIVE NEGATIVE   Staphylococcus aureus NEGATIVE NEGATIVE    Comment: (NOTE) The Xpert SA Assay (FDA approved for NASAL specimens in patients 7 years of age and older), is one component of a comprehensive surveillance program. It is not intended to diagnose infection nor to guide or monitor treatment. Performed at [facility], [address] [place], [place] [address]   Urinalysis, Routine w reflex microscopic -Urine, Clean Catch     Status: Abnormal   Collection Time: 07/29/23  2:05 PM  Result Value Ref Range   Color, Urine YELLOW (A) YELLOW   APPearance HAZY (A) CLEAR   Specific Gravity, Urine 1.016 1.005 - 1.030   pH 5.0 5.0 - 8.0   Glucose, UA NEGATIVE NEGATIVE mg/dL   Hgb urine dipstick NEGATIVE NEGATIVE   Bilirubin Urine NEGATIVE NEGATIVE   Ketones, ur NEGATIVE NEGATIVE mg/dL   Protein, ur NEGATIVE NEGATIVE mg/dL   Nitrite NEGATIVE NEGATIVE   Leukocytes,Ua SMALL (A) NEGATIVE   RBC / HPF 0-5 0 - 5 RBC/hpf   WBC, UA 6-10 0 - 5 WBC/hpf   Bacteria, UA RARE (A) NONE SEEN    Squamous Epithelial / HPF 6-10 0 - 5 /HPF   Mucus PRESENT     Comment: Performed at [facility], [address] [place], [place] [address]  Urine Culture Add-On     Status: Abnormal   Collection Time: 07/29/23  2:05 PM   Specimen: Urine, Clean Catch  Result Value Ref Range   Specimen Description      URINE, CLEAN CATCH Performed at [facility], [address] [place], [place] [address]    Special Requests      NONE Performed at [facility], [address] [place], [place] [address]    Culture (A)     <10,000 COLONIES/mL INSIGNIFICANT GROWTH Performed at [facility], [address] [place], [place] [address]    Report Status 07/31/2023 FINAL   Glucose, capillary     Status: Abnormal   Collection Time: 08/05/23  6:19 AM  Result Value Ref Range   Glucose-Capillary 155 (H) 70 - 99 mg/dL    Comment: Glucose reference range applies only to samples taken after fasting for at least 8 hours.   Comment 1 Notify RN    Comment 2 Document in Chart   Glucose, capillary     Status: Abnormal   Collection Time: 08/05/23 10:16 AM  Result Value Ref Range   Glucose-Capillary 225 (H) 70 - 99 mg/dL    Comment: Glucose reference range applies only to samples taken after fasting for at least 8 hours.  Glucose, capillary     Status: Abnormal   Collection Time: 08/05/23  5:01 PM  Result Value Ref Range   Glucose-Capillary 286 (H) 70 - 99 mg/dL    Comment: Glucose reference range applies only to samples taken after fasting for at least 8 hours.  Glucose, capillary     Status: Abnormal   Collection Time: 08/05/23  9:43 PM  Result Value Ref Range   Glucose-Capillary 319 (H) 70 - 99 mg/dL    Comment: Glucose reference range applies only to samples taken after fasting for at least 8 hours.  CBC     Status: Abnormal   Collection Time: 08/06/23  6:43 AM  Result Value Ref Range   WBC 15.8 (H) 4.0 - 10.5 K/uL   RBC 4.40 3.87 - 5.11 MIL/uL   Hemoglobin 13.2 12.0  -  15.0 g/dL   HCT 61.0 63.9 - 53.9 %   MCV 88.4 80.0 - 100.0 fL   MCH 30.0 26.0 - 34.0 pg   MCHC 33.9 30.0 - 36.0 g/dL   RDW 86.1 88.4 - 84.4 %   Platelets 186 150 - 400 K/uL   nRBC 0.0 0.0 - 0.2 %    Comment: Performed at [facility], [address], [place], [place] [address]  Basic metabolic panel     Status: Abnormal   Collection Time: 08/06/23  6:43 AM  Result Value Ref Range   Sodium 137 135 - 145 mmol/L   Potassium 4.2 3.5 - 5.1 mmol/L   Chloride 106 98 - 111 mmol/L   CO2 22 22 - 32 mmol/L   Glucose, Bld 223 (H) 70 - 99 mg/dL    Comment: Glucose reference range applies only to samples taken after fasting for at least 8 hours.   BUN 17 8 - 23 mg/dL   Creatinine, Ser 9.19 0.44 - 1.00 mg/dL   Calcium  9.0 8.9 - 10.3 mg/dL   GFR, Estimated >39 >39 mL/min    Comment: (NOTE) Calculated using the CKD-EPI Creatinine Equation (2021)    Anion gap 9 5 - 15    Comment: Performed at [facility], [address] [place], [place] [address]  Glucose, capillary     Status: Abnormal   Collection Time: 08/06/23  8:15 AM  Result Value Ref Range   Glucose-Capillary 199 (H) 70 - 99 mg/dL    Comment: Glucose reference range applies only to samples taken after fasting for at least 8 hours.    Radiology: DG Knee Left Port Result Date: 08/05/2023 CLINICAL DATA:  Status post right total knee replacement. EXAM: PORTABLE LEFT KNEE - 1-2 VIEW COMPARISON:  07/03/2021 FINDINGS: Interval total knee prosthesis in satisfactory position and alignment. No fracture or dislocation seen. IMPRESSION: Total knee prosthesis in satisfactory position and alignment. Electronically Signed   By: Elspeth Bathe M.D.   On: 08/05/2023 10:42    No results found.  DG Knee Left Port Result Date: 08/05/2023 CLINICAL DATA:  Status post right total knee replacement. EXAM: PORTABLE LEFT KNEE - 1-2 VIEW COMPARISON:  07/03/2021 FINDINGS: Interval total knee prosthesis in satisfactory position and alignment.  No fracture or dislocation seen. IMPRESSION: Total knee prosthesis in satisfactory position and alignment. Electronically Signed   By: Elspeth Bathe M.D.   On: 08/05/2023 10:42      Assessment and Plan: Patient Active Problem List   Diagnosis Date Noted   S/P TKR (total knee replacement), left 08/05/2023   Spondylolisthesis at L4-L5 level 10/22/2022   OSA on CPAP 01/27/2021   Diabetes mellitus without complication (HCC) 06/13/2018   Primary osteoarthritis of both hands 08/30/2017   Primary osteoarthritis of both knees 08/30/2017   DDD (degenerative disc disease), cervical 08/30/2017   DDD (degenerative disc disease), lumbar 08/30/2017   History of insomnia 08/30/2017   Fatty liver 08/30/2017   History of TIA (transient ischemic attack) 08/30/2017   S/p bilateral carpal tunnel release 08/30/2017   History of gastroesophageal reflux (GERD) 08/30/2017   History of vitamin D deficiency 08/30/2017   Spondylolisthesis of lumbar region 01/12/2017   Benign essential hypertension 05/06/2016   Oral herpes 05/06/2016   Carpal tunnel syndrome of left wrist 11/08/2015   Hyperlipidemia, unspecified 05/10/2015   Osteopenia 11/05/2014   Closed fracture of fibula 01/09/2014   Sleep apnea 01/06/2011   Restless leg syndrome 01/06/2011   Insomnia 01/06/2011  Fibromyalgia 01/06/2011   GERD (gastroesophageal reflux disease) 12/15/2007    1. OSA on CPAP (Primary) The patient does tolerate PAP and reports  benefit from PAP use. The patient was reminded how to clean equipment and advised to replace supplies routinely. The patient was also counselled on weight loss. The compliance is fair. The AHI is 2.5.   OSA on cpap- controlled. Continue with excellent compliance with pap. CPAP continues to be medically necessary to treat this patient's OSA. F/u one year.    2. CPAP use counseling CPAP Counseling: had a lengthy discussion with the patient regarding the importance of PAP therapy in management of  the sleep apnea. Patient appears to understand the risk factor reduction and also understands the risks associated with untreated sleep apnea. Patient will try to make a good faith effort to remain compliant with therapy. Also instructed the patient on proper cleaning of the device including the water must be changed daily if possible and use of distilled water is preferred. Patient understands that the machine should be regularly cleaned with appropriate recommended cleaning solutions that do not damage the PAP machine for example given white vinegar and water rinses. Other methods such as ozone treatment may not be as good as these simple methods to achieve cleaning.   3. Benign essential hypertension Controlled with ziac . Continue.   4. Restless leg syndrome Slightly increased symptoms since surgery, likely due to her knee pain. Continue pramipexole .     General Counseling: I have discussed the findings of the evaluation and examination with Nena.  I have also discussed any further diagnostic evaluation thatmay be needed or ordered today. Marshia verbalizes understanding of the findings of todays visit. We also reviewed her medications today and discussed drug interactions and side effects including but not limited excessive drowsiness and altered mental states. We also discussed that there is always a risk not just to her but also people around her. she has been encouraged to call the office with any questions or concerns that should arise related to todays visit.  No orders of the defined types were placed in this encounter.       I have personally obtained a history, examined the patient, evaluated laboratory and imaging results, formulated the assessment and plan and placed orders. This patient was seen today by Lauraine Lay, PA-C in collaboration with Dr. Elfreda Bathe.   Elfreda DELENA Bathe, MD [facility] Diplomate ABMS Pulmonary Critical Care Medicine and Sleep Medicine

## 2023-08-30 ENCOUNTER — Ambulatory Visit (INDEPENDENT_AMBULATORY_CARE_PROVIDER_SITE_OTHER): Admitting: Internal Medicine

## 2023-08-30 VITALS — BP 140/86 | HR 77 | Resp 16 | Ht 60.25 in | Wt 215.0 lb

## 2023-08-30 DIAGNOSIS — I10 Essential (primary) hypertension: Secondary | ICD-10-CM

## 2023-08-30 DIAGNOSIS — Z7189 Other specified counseling: Secondary | ICD-10-CM

## 2023-08-30 DIAGNOSIS — G4733 Obstructive sleep apnea (adult) (pediatric): Secondary | ICD-10-CM | POA: Diagnosis not present

## 2023-08-30 DIAGNOSIS — G2581 Restless legs syndrome: Secondary | ICD-10-CM | POA: Diagnosis not present

## 2023-08-30 NOTE — Patient Instructions (Signed)

## 2023-09-02 ENCOUNTER — Other Ambulatory Visit: Payer: Self-pay

## 2023-09-02 MED ORDER — TRAMADOL HCL 50 MG PO TABS: 50.0000 mg | ORAL_TABLET | Freq: Four times a day (QID) | ORAL | 0 refills | Status: DC | PRN

## 2023-09-02 MED ORDER — ZOLPIDEM TARTRATE 5 MG PO TABS: ORAL_TABLET | ORAL | 0 refills | Status: DC

## 2023-09-02 NOTE — Telephone Encounter (Addendum)
 Last Fill: 08/06/2023 & 08/04/2023  UDS: 07/15/2023 Negative , patient is taking it 3-4 times a week as needed  Narc Agreement: 07/26/2023 UDS is consistent with treatment   Next Visit: 12/30/2023  Last Visit: 07/15/2023   Dx: Fibromyalgia   Current Dose per office note on 07/15/2023: tramadol  50 mg p.o. nightly as needed for pain management 06/25/2022-Ambien  5 mg at bedtime as needed for insomnia   Patient had surgery 08/20/2023 and was prescribed Hydrocodone  and Tramadol  by Verlinda Boas, PA-C   Okay to refill Tramadol  and ambien ?

## 2023-09-03 MED ORDER — TRAMADOL HCL 50 MG PO TABS: 50.0000 mg | ORAL_TABLET | Freq: Four times a day (QID) | ORAL | 0 refills | Status: DC | PRN

## 2023-09-03 NOTE — Addendum Note (Signed)
 Addended by: CENA ALFONSO CROME on: 09/03/2023 08:26 AM   Modules accepted: Orders

## 2023-10-01 ENCOUNTER — Other Ambulatory Visit: Payer: Self-pay | Admitting: Rheumatology

## 2023-10-01 NOTE — Telephone Encounter (Signed)
 Last Fill: 09/02/2023  Next Visit: 12/30/2023  Last Visit: 07/15/2023  Dx: not mentioned   Current Dose per office note on 07/15/2023: dose not discussed   Okay to refill Ambien ?

## 2023-11-01 ENCOUNTER — Other Ambulatory Visit: Payer: Self-pay

## 2023-11-01 MED ORDER — ZOLPIDEM TARTRATE 5 MG PO TABS: 5.0000 mg | ORAL_TABLET | Freq: Every evening | ORAL | 0 refills | Status: DC | PRN

## 2023-11-01 MED ORDER — TRAMADOL HCL 50 MG PO TABS: 50.0000 mg | ORAL_TABLET | Freq: Four times a day (QID) | ORAL | 0 refills | Status: DC | PRN

## 2023-11-01 NOTE — Telephone Encounter (Signed)
 Last Fill: 09/03/2023 (tramadol ) 10/01/2023  UDS: 07/15/2023 Negative , patient is taking it 3-4 times a week as needed   Narc Agreement: 07/26/2023 UDS is consistent with treatment   Next Visit: 12/30/2023  Last Visit: 07/15/2023  Dx: Fibromyalgia    Current Dose per office note on 07/15/2023:tramadol  50 mg p.o. nightly as needed  06/25/2022-Ambien  5 mg at bedtime as needed for insomnia.    Okay to refill Tramadol  and ambien ?

## 2023-12-03 ENCOUNTER — Other Ambulatory Visit: Payer: Self-pay

## 2023-12-03 MED ORDER — ZOLPIDEM TARTRATE 5 MG PO TABS: 5.0000 mg | ORAL_TABLET | Freq: Every evening | ORAL | 0 refills | Status: DC | PRN

## 2023-12-03 MED ORDER — TRAMADOL HCL 50 MG PO TABS: 50.0000 mg | ORAL_TABLET | Freq: Four times a day (QID) | ORAL | 0 refills | Status: DC | PRN

## 2023-12-03 NOTE — Telephone Encounter (Signed)
 Patient contacted the office to request refills of Zolpidem  and Tramadol  be sent to [facility] on [address]   Last Fill: 11/01/2023  UDS:07/15/2023 UDS negative for tramadol  use.  Did patient ran out of tramadol ?  We should not be filling tramadol  if she is not taking it.   Narc Agreement: 07/15/2023  Next Visit: 12/30/2023  Last Visit: 07/15/2023  Dx: Fibromyalgia, Zolpidem  not mentioned  Current Dose per office note on 07/15/2023: tramadol  50 mg 1 tablet at bedtime as needed for pain relief. Zolpidem  not mentioned   Okay to refill Tramadol  and Zolpidem ?

## 2023-12-16 NOTE — Progress Notes (Signed)
 Office Visit Note  Patient: Carolyn Graham             Date of Birth: 1956/01/04           MRN: [id number]             PCP: Glover Lenis, MD Referring: Glover Lenis, MD Visit Date: 12/30/2023 Occupation: Data Unavailable  Subjective:  Arthralgias   History of Present Illness: Carolyn Graham is a 68 y.o. female with history of osteoarthritis, DDD, and fibromyalgia.  Patient reports that she underwent a left total knee replacement on 08/05/2023 by Dr. Lorelle.  Patient states that she continues to have some difficulty going down steps as well as nocturnal pain affecting the left knee.  Patient has been going to physical therapy twice a week to improve her strength and range of motion.  She will be following up with Dr. Lorelle on 08/05/2023.  Patient states that she has noticed increased edema in bilateral lower legs.  She had a recent follow-up visit with her PCP but no medication changes were made at that time. Patient states that she has had a few fibromyalgia flare since her last office visit.  She continues to take tramadol  50 mg 1 tablet daily for pain relief.   Activities of Daily Living:  Patient reports joint stiffness all day  Patient Reports nocturnal pain.  Difficulty dressing/grooming: Denies Difficulty climbing stairs: Reports Difficulty getting out of chair: Reports Difficulty using hands for taps, buttons, cutlery, and/or writing: Denies  Review of Systems  Constitutional:  Positive for fatigue.  HENT:  Positive for mouth dryness. Negative for mouth sores and nose dryness.   Eyes:  Negative for pain, visual disturbance and dryness.  Respiratory:  Negative for cough, hemoptysis, shortness of breath and difficulty breathing.   Cardiovascular:  Negative for chest pain, palpitations, hypertension and swelling in legs/feet.  Gastrointestinal:  Negative for blood in stool, constipation and diarrhea.  Endocrine: Negative for increased urination.  Genitourinary:  Negative  for painful urination.  Musculoskeletal:  Positive for joint pain, joint pain and morning stiffness. Negative for joint swelling, myalgias, muscle weakness, muscle tenderness and myalgias.  Skin:  Positive for rash. Negative for color change, pallor, hair loss, nodules/bumps, skin tightness, ulcers and sensitivity to sunlight.  Allergic/Immunologic: Negative for susceptible to infections.  Neurological:  Positive for headaches. Negative for dizziness, numbness and weakness.  Hematological:  Negative for swollen glands.  Psychiatric/Behavioral:  Positive for sleep disturbance. Negative for depressed mood. The patient is not nervous/anxious.     PMFS History:  Patient Active Problem List   Diagnosis Date Noted   CPAP use counseling 08/30/2023   S/P TKR (total knee replacement), left 08/05/2023   Spondylolisthesis at L4-L5 level 10/22/2022   OSA on CPAP 01/27/2021   Diabetes mellitus without complication (HCC) 06/13/2018   Primary osteoarthritis of both hands 08/30/2017   Primary osteoarthritis of both knees 08/30/2017   DDD (degenerative disc disease), cervical 08/30/2017   DDD (degenerative disc disease), lumbar 08/30/2017   History of insomnia 08/30/2017   Fatty liver 08/30/2017   History of TIA (transient ischemic attack) 08/30/2017   S/p bilateral carpal tunnel release 08/30/2017   History of gastroesophageal reflux (GERD) 08/30/2017   History of vitamin D deficiency 08/30/2017   Spondylolisthesis of lumbar region 01/12/2017   Benign essential hypertension 05/06/2016   Oral herpes 05/06/2016   Carpal tunnel syndrome of left wrist 11/08/2015   Hyperlipidemia, unspecified 05/10/2015   Osteopenia 11/05/2014   Closed fracture  of fibula 01/09/2014   Sleep apnea 01/06/2011   Restless leg syndrome 01/06/2011   Insomnia 01/06/2011   Fibromyalgia 01/06/2011   GERD (gastroesophageal reflux disease) 12/15/2007    Past Medical History:  Diagnosis Date   Allergy    Anemia     Arthritis    Broken ankle 11/2013   right ankle   Cataract    Bilateral - surgery to remove   Depression    Diabetes mellitus without complication (HCC)    type 2   Fatty liver    Fever blister    Fibromyalgia 2005   GERD (gastroesophageal reflux disease)    uses prilosec   Headache(784.0)    not as often as before   Hypercholesteremia    Hypertension    Hyperthyroidism    in remission per patient, no meds   OSA (obstructive sleep apnea)    uses CPAP nightly   Plantar fasciitis    no current problems per patient as of 10/08/22   Pneumonia    age 88 -7   Post-operative nausea and vomiting    Primary osteoarthritis of left knee    Restless leg syndrome    Schatzki's ring    Spondylolisthesis of lumbar region    Stress incontinence    Stroke (HCC) 2015   TIA.  unable to determine if this is true.   Thyroiditis    Vertigo     Family History  Problem Relation Age of Onset   Colon cancer Mother 57   Lung cancer Father 54   Colon polyps Maternal Aunt    Colon cancer Maternal Grandfather    Breast cancer Neg Hx    Esophageal cancer Neg Hx    Stomach cancer Neg Hx    Rectal cancer Neg Hx    Past Surgical History:  Procedure Laterality Date   CARPAL TUNNEL RELEASE Left 12/18/2015   Procedure: CARPAL TUNNEL RELEASE;  Surgeon: Helayne Glenn, MD;  Location: [facility];  Service: Orthopedics;  Laterality: Left;   CARPAL TUNNEL RELEASE Right 2018   CATARACT EXTRACTION W/PHACO Right 11/13/2015   Procedure: CATARACT EXTRACTION PHACO AND INTRAOCULAR LENS PLACEMENT (IOC);  Surgeon: Steven Dingeldein, MD;  Location: [facility];  Service: Ophthalmology;  Laterality: Right;  US  01:08AP% 21.6CDE 26.99Fluid pack lot # [id number]   CATARACT EXTRACTION W/PHACO Left 12/04/2015   Procedure: CATARACT EXTRACTION PHACO AND INTRAOCULAR LENS PLACEMENT (IOC);  Surgeon: Steven Dingeldein, MD;  Location: [facility];  Service: Ophthalmology;  Laterality: Left;  US  59.2 AP% 24.4 CDE 29.2 Fluid Pack lot #  [id number]   CERVICAL DISC SURGERY     cervical decompression c4-5 and c5-6   COLONOSCOPY W/ BIOPSIES AND POLYPECTOMY     CRT left finger     CTR     right hand   EYE SURGERY Bilateral    cataract    KYPHOPLASTY  02/12/2023   LUMBAR FUSION  10/22/2022   L4-L5   LUMBAR SPINE SURGERY  2018   SHOULDER SURGERY     right shoulder   TOTAL KNEE ARTHROPLASTY Left 08/05/2023   Procedure: ARTHROPLASTY, KNEE, TOTAL;  Surgeon: Lorelle Hussar, MD;  Location: [facility];  Service: Orthopedics;  Laterality: Left;   TRIGGER FINGER RELEASE     right finger   VAGINA SURGERY  04/2022   vaginal tear repair   WISDOM TOOTH EXTRACTION     WISDOM TOOTH EXTRACTION     Social History   Tobacco Use   Smoking status: Never    Passive exposure:  Past   Smokeless tobacco: Never  Vaping Use   Vaping status: Never Used  Substance Use Topics   Alcohol use: Yes    Comment: OCCAS   Drug use: No   Social History   Social History Narrative   Married to [place], no natural children   Right handed   Bachelor's degree   2-3 cups daily           Immunization History  Administered Date(s) Administered   Influenza Inj Mdck Quad Pf 12/11/2016   Influenza Inj Mdck Quad With Preservative 12/13/2017   Influenza,inj,Quad PF,6+ Mos 10/29/2018   Influenza-Unspecified 02/15/2015, 01/01/2016, 12/02/2016, 12/22/2021   Moderna Covid-19 Vaccine Bivalent Booster 14yrs & up 12/24/2021   PFIZER(Purple Top)SARS-COV-2 Vaccination 05/11/2019, 06/06/2019, 12/11/2019   Tdap 11/14/2007, 04/14/2017     Objective: Vital Signs: BP 124/78   Pulse 76   Temp 98.3 F (36.8 C)   Resp 14   Ht 5' 1 (1.549 m)   Wt 216 lb (98 kg)   BMI 40.81 kg/m    Physical Exam Vitals and nursing note reviewed.  Constitutional:      Appearance: She is well-developed.  HENT:     Head: Normocephalic and atraumatic.  Eyes:     Conjunctiva/sclera: Conjunctivae normal.  Cardiovascular:     Rate and Rhythm: Normal rate and regular  rhythm.     Heart sounds: Normal heart sounds.  Pulmonary:     Effort: Pulmonary effort is normal.     Breath sounds: Normal breath sounds.  Abdominal:     General: Bowel sounds are normal.     Palpations: Abdomen is soft.  Musculoskeletal:     Cervical back: Normal range of motion.     Right lower leg: Edema present.     Left lower leg: Edema present.  Lymphadenopathy:     Cervical: No cervical adenopathy.  Skin:    General: Skin is warm and dry.     Capillary Refill: Capillary refill takes less than 2 seconds.  Neurological:     Mental Status: She is alert and oriented to person, place, and time.  Psychiatric:        Behavior: Behavior normal.      Musculoskeletal Exam: C-spine has good range of motion.  Limited mobility of the lumbar spine.  Shoulder joints, elbow joints, wrist joints, MCPs, PIPs, DIPs have good range of motion with no synovitis.  PIP and DIP thickening consistent with osteoarthritis of both hands.  Complete fist formation bilaterally.  Hip joints have good range of motion with no groin pain.  Left knee replacement has warmth.  Right knee joint has good range of motion no warmth or effusion.  Ankle joints have good range of motion no tenderness or joint swelling.  No evidence of Achilles tendinitis or plantar fasciitis.   CDAI Exam: CDAI Score: -- Patient Global: --; Provider Global: -- Swollen: --; Tender: -- Joint Exam 12/30/2023   No joint exam has been documented for this visit   There is currently no information documented on the homunculus. Go to the Rheumatology activity and complete the homunculus joint exam.  Investigation: No additional findings.  Imaging: No results found.  Recent Labs: Lab Results  Component Value Date   WBC 15.8 (H) 08/06/2023   HGB 13.2 08/06/2023   PLT 186 08/06/2023   NA 137 08/06/2023   K 4.2 08/06/2023   CL 106 08/06/2023   CO2 22 08/06/2023   GLUCOSE 223 (H) 08/06/2023   BUN 17 08/06/2023  CREATININE 0.80  08/06/2023   BILITOT 0.2 (L) 10/08/2022   ALKPHOS 67 10/08/2022   AST 51 (H) 10/08/2022   ALT 53 (H) 10/08/2022   PROT 7.7 10/08/2022   ALBUMIN  3.7 10/08/2022   CALCIUM  9.0 08/06/2023   GFRAA 89 08/04/2017    Speciality Comments: No specialty comments available.  Procedures:  No procedures performed Allergies: Nickel, Codeine, and Darvocet [propoxyphene n-acetaminophen ]    Assessment / Plan:     Visit Diagnoses: Primary osteoarthritis of both hands: She has PIP and DIP thickening consistent with osteoarthritis of both hands.  No synovitis noted on exam.  Complete fist formation bilaterally.  Discussed the importance of joint protection and muscle strengthening.  She takes tramadol  50 mg 1 tablet daily for pain relief.  Primary osteoarthritis of right knee - underwent viscosupplementation for both knees in 2020.  S/P total knee arthroplasty, left: Performed by Dr. Lorelle on 08/05/23.  She continues to have some discomfort and weakness when going down steps.  She has intermittent nocturnal pain.  She has been going to physical therapy twice a week.  DDD (degenerative disc disease), cervical: C-spine has limited range of motion without rotation.  No symptoms of radiculopathy.  Degeneration of intervertebral disc of lumbar region without discogenic back pain or lower extremity pain - Underwent posterior lumbar fusion L4-L5 on 10/22/2022 performed by Dr. Colon.  Chronic pain.  Patient has undergone physical therapy.  Fibromyalgia: Patient continues to experience interval myalgias and muscle tenderness due to fibromyalgia.  She has had a few fibromyalgia flare since her last office visit.  She has been taking tramadol  50 mg 1 tablet daily as needed for pain relief.  Discussed the importance of regular exercise and good sleep hygiene.  She is currently go to physical therapy twice a week status post left total knee arthroplasty on 08/05/2023. She takes Ambien  5 mg 1 tablet at bedtime for  insomnia.  Medication monitoring encounter -UDS updated today on 12/30/2023.  A refill of tramadol  be sent to the pharmacy on 01/03/2024.  Plan: DRUG MONITOR, TRAMADOL ,QN, URINE, DRUG MONITOR, PANEL 5, W/CONF, URINE  Osteopenia of multiple sites - DEXA updated on 08/18/21.    DEXA updated on 09/09/23.  She has established care with endocrinology. Tried alendronate for around 5 years consecutively--discontinued in November 2024 status post vertebral fracture.  Prolia was initiated on 12/21/2023. She is taking a calcium  supplement daily and vitamin D 2000 units daily.  History of vitamin D deficiency: She is taking vitamin D 2000 units daily.  Flea bite of multiple sites: Patient reports that she has had a flea infestation that her home and has had several flea bites on her lower legs.  She has had excessive scratching which has not responded to over-the-counter topicals.  She has requested a refill of betamethasone which she has used in the past.  A prescription for betamethasone valerate 0.1% cream was sent to the pharmacy.    Other medical conditions are listed as follows:  History of diabetes mellitus, type II  History of TIA (transient ischemic attack)  History of gastroesophageal reflux (GERD)  Fatty liver  RLS (restless legs syndrome)  History of migraine  History of hypertension: Blood pressure was 124/78 today in the office.  History of sleep apnea  History of recent fall - Fell in November 2024 and developed T12 compression fracture.  She had kyphoplasty in December 2024.  She had good response to kyphoplasty.  Orders: Orders Placed This Encounter  Procedures   DRUG  MONITOR, TRAMADOL ,QN, URINE   DRUG MONITOR, PANEL 5, W/CONF, URINE   Meds ordered this encounter  Medications   betamethasone valerate (VALISONE) 0.1 % cream    Sig: Apply topically 2 (two) times daily for 14 days.    Dispense:  30 g    Refill:  0     Follow-Up Instructions: Return in about 6 months  (around 06/28/2024) for Osteoarthritis, DDD, Fibromyalgia.   Waddell CHRISTELLA Craze, PA-C  Note - This record has been created using Dragon software.  Chart creation errors have been sought, but may not always  have been located. Such creation errors do not reflect on  the standard of medical care.

## 2023-12-30 ENCOUNTER — Ambulatory Visit: Attending: Physician Assistant | Admitting: Physician Assistant

## 2023-12-30 ENCOUNTER — Encounter: Payer: Self-pay | Admitting: Physician Assistant

## 2023-12-30 VITALS — BP 124/78 | HR 76 | Temp 98.3°F | Resp 14 | Ht 61.0 in | Wt 216.0 lb

## 2023-12-30 DIAGNOSIS — M51369 Other intervertebral disc degeneration, lumbar region without mention of lumbar back pain or lower extremity pain: Secondary | ICD-10-CM

## 2023-12-30 DIAGNOSIS — Z5181 Encounter for therapeutic drug level monitoring: Secondary | ICD-10-CM

## 2023-12-30 DIAGNOSIS — M8589 Other specified disorders of bone density and structure, multiple sites: Secondary | ICD-10-CM

## 2023-12-30 DIAGNOSIS — M1711 Unilateral primary osteoarthritis, right knee: Secondary | ICD-10-CM | POA: Diagnosis not present

## 2023-12-30 DIAGNOSIS — Z9181 History of falling: Secondary | ICD-10-CM

## 2023-12-30 DIAGNOSIS — M170 Bilateral primary osteoarthritis of knee: Secondary | ICD-10-CM

## 2023-12-30 DIAGNOSIS — Z8719 Personal history of other diseases of the digestive system: Secondary | ICD-10-CM

## 2023-12-30 DIAGNOSIS — G2581 Restless legs syndrome: Secondary | ICD-10-CM

## 2023-12-30 DIAGNOSIS — M19042 Primary osteoarthritis, left hand: Secondary | ICD-10-CM

## 2023-12-30 DIAGNOSIS — M19041 Primary osteoarthritis, right hand: Secondary | ICD-10-CM

## 2023-12-30 DIAGNOSIS — M797 Fibromyalgia: Secondary | ICD-10-CM

## 2023-12-30 DIAGNOSIS — Z96652 Presence of left artificial knee joint: Secondary | ICD-10-CM | POA: Diagnosis not present

## 2023-12-30 DIAGNOSIS — W57XXXA Bitten or stung by nonvenomous insect and other nonvenomous arthropods, initial encounter: Secondary | ICD-10-CM

## 2023-12-30 DIAGNOSIS — Z8639 Personal history of other endocrine, nutritional and metabolic disease: Secondary | ICD-10-CM

## 2023-12-30 DIAGNOSIS — G8929 Other chronic pain: Secondary | ICD-10-CM

## 2023-12-30 DIAGNOSIS — M5030 Other cervical disc degeneration, unspecified cervical region: Secondary | ICD-10-CM | POA: Diagnosis not present

## 2023-12-30 DIAGNOSIS — Z8669 Personal history of other diseases of the nervous system and sense organs: Secondary | ICD-10-CM

## 2023-12-30 DIAGNOSIS — Z8673 Personal history of transient ischemic attack (TIA), and cerebral infarction without residual deficits: Secondary | ICD-10-CM

## 2023-12-30 DIAGNOSIS — Z8679 Personal history of other diseases of the circulatory system: Secondary | ICD-10-CM

## 2023-12-30 DIAGNOSIS — K760 Fatty (change of) liver, not elsewhere classified: Secondary | ICD-10-CM

## 2023-12-30 MED ORDER — BETAMETHASONE VALERATE 0.1 % EX CREA: TOPICAL_CREAM | Freq: Two times a day (BID) | CUTANEOUS | 0 refills | Status: AC

## 2023-12-31 LAB — DM TEMPLATE

## 2024-01-02 LAB — DRUG MONITOR, PANEL 5, W/CONF, URINE
Amphetamines: NEGATIVE ng/mL (ref <500)
Barbiturates: NEGATIVE ng/mL (ref <300)
Benzodiazepines: NEGATIVE ng/mL (ref <100)
Cocaine Metabolite: NEGATIVE ng/mL (ref <150)
Creatinine: 73.4 mg/dL (ref >=20.0)
Marijuana Metabolite: NEGATIVE ng/mL (ref <20)
Methadone Metabolite: NEGATIVE ng/mL (ref <100)
Opiates: NEGATIVE ng/mL (ref <100)
Oxidant: NEGATIVE ug/mL (ref <200)
Oxycodone: NEGATIVE ng/mL (ref <100)
pH: 6.5 (ref 4.5–9.0)

## 2024-01-02 LAB — DRUG MONITOR, TRAMADOL,QN, URINE
Desmethyltramadol: 5532 ng/mL — ABNORMAL HIGH (ref <100)
Tramadol: 3496 ng/mL — ABNORMAL HIGH (ref <100)

## 2024-01-02 LAB — DM TEMPLATE

## 2024-01-03 ENCOUNTER — Ambulatory Visit: Payer: Self-pay | Admitting: Physician Assistant

## 2024-01-03 NOTE — Progress Notes (Signed)
UDS consistent with tramadol use.

## 2024-01-04 ENCOUNTER — Other Ambulatory Visit: Payer: Self-pay

## 2024-01-04 MED ORDER — ZOLPIDEM TARTRATE 5 MG PO TABS: 5.0000 mg | ORAL_TABLET | Freq: Every evening | ORAL | 0 refills | Status: DC | PRN

## 2024-01-04 MED ORDER — TRAMADOL HCL 50 MG PO TABS: 50.0000 mg | ORAL_TABLET | Freq: Every day | ORAL | 0 refills | Status: DC | PRN

## 2024-01-04 NOTE — Telephone Encounter (Signed)
 Patient requested refills of tramadol  and ambien .   Last Fill: 12/03/2023 (of both medications)   UDS:12/30/2023 c/w  Narc Agreement: 07/15/2023  Next Visit: 06/29/2024  Last Visit: 12/30/2023  Dx: Primary osteoarthritis of both hands, Fibromyalgia   Current Dose per office note on 12/30/2023:tramadol  50 mg 1 tablet daily, Ambien  5 mg 1 tablet at bedtime for insomnia.    Okay to refill tramadol  and ambien ?

## 2024-02-01 ENCOUNTER — Other Ambulatory Visit: Payer: Self-pay | Admitting: *Deleted

## 2024-02-01 MED ORDER — TRAMADOL HCL 50 MG PO TABS: 50.0000 mg | ORAL_TABLET | Freq: Every day | ORAL | 0 refills | Status: DC | PRN

## 2024-02-01 MED ORDER — ZOLPIDEM TARTRATE 5 MG PO TABS: 5.0000 mg | ORAL_TABLET | Freq: Every evening | ORAL | 0 refills | Status: DC | PRN

## 2024-02-01 NOTE — Telephone Encounter (Signed)
 Patient requested refills of tramadol  and ambien .   Last Fill: 01/04/2024 (of both medications)    UDS:12/30/2023 c/w   Narc Agreement: 07/15/2023   Next Visit: 06/29/2024   Last Visit: 12/30/2023   Dx: Primary osteoarthritis of both hands, Fibromyalgia    Current Dose per office note on 12/30/2023:tramadol  50 mg 1 tablet daily, Ambien  5 mg 1 tablet at bedtime for insomnia.      Okay to refill tramadol  and ambien ?

## 2024-03-02 ENCOUNTER — Other Ambulatory Visit: Payer: Self-pay

## 2024-03-02 ENCOUNTER — Other Ambulatory Visit: Payer: Self-pay | Admitting: *Deleted

## 2024-03-02 MED ORDER — TRAMADOL HCL 50 MG PO TABS: 50.0000 mg | ORAL_TABLET | Freq: Every day | ORAL | 0 refills | Status: DC | PRN

## 2024-03-02 MED ORDER — ZOLPIDEM TARTRATE 5 MG PO TABS: 5.0000 mg | ORAL_TABLET | Freq: Every evening | ORAL | 0 refills | Status: DC | PRN

## 2024-03-02 MED ORDER — TRAMADOL HCL 50 MG PO TABS: 50.0000 mg | ORAL_TABLET | Freq: Every day | ORAL | 0 refills | Status: AC | PRN

## 2024-03-02 MED ORDER — ZOLPIDEM TARTRATE 5 MG PO TABS: 5.0000 mg | ORAL_TABLET | Freq: Every evening | ORAL | 0 refills | Status: AC | PRN

## 2024-03-02 NOTE — Progress Notes (Unsigned)
 Patient contacted the office and states her Tramadol  and Ambien  were sent to the wrong pharmacy. Contacted [facility] in [place] and spoke with JP, who cancelled the Tramadol  and Ambien . Patient states she needs the medications sent to the [facility] in [place] , where we sent them last month. Please review and sign.

## 2024-03-02 NOTE — Telephone Encounter (Signed)
 Patient contacted the office and requested a refill on Tramadol  and Ambien .   Last Fill: 02/01/2024 (of both medications)    UDS:12/30/2023 c/w   Narc Agreement: 07/15/2023   Next Visit: 06/29/2024   Last Visit: 12/30/2023   Dx: Primary osteoarthritis of both hands, Fibromyalgia    Current Dose per office note on 12/30/2023:tramadol  50 mg 1 tablet daily, Ambien  5 mg 1 tablet at bedtime for insomnia.      Okay to refill tramadol  and ambien ?

## 2024-06-29 ENCOUNTER — Ambulatory Visit: Admitting: Rheumatology
